# Patient Record
Sex: Male | Born: 1970 | Race: White | Hispanic: No | Marital: Single | Smoking: Former smoker
Health system: Southern US, Community
[De-identification: ages and names within clinical notes are randomized; demographics above are authoritative.]

## PROBLEM LIST (undated history)

## (undated) DIAGNOSIS — F819 Developmental disorder of scholastic skills, unspecified: Secondary | ICD-10-CM

## (undated) DIAGNOSIS — F29 Unspecified psychosis not due to a substance or known physiological condition: Secondary | ICD-10-CM

## (undated) DIAGNOSIS — T7840XA Allergy, unspecified, initial encounter: Secondary | ICD-10-CM

## (undated) DIAGNOSIS — J45909 Unspecified asthma, uncomplicated: Secondary | ICD-10-CM

## (undated) DIAGNOSIS — F319 Bipolar disorder, unspecified: Secondary | ICD-10-CM

## (undated) HISTORY — PX: HERNIA REPAIR: SHX51

## (undated) HISTORY — DX: Unspecified psychosis not due to a substance or known physiological condition: F29

## (undated) HISTORY — DX: Allergy, unspecified, initial encounter: T78.40XA

---

## 1998-12-21 ENCOUNTER — Inpatient Hospital Stay (HOSPITAL_COMMUNITY): Admission: AD | Admit: 1998-12-21 | Discharge: 1998-12-24 | Payer: Self-pay | Admitting: *Deleted

## 1999-05-10 ENCOUNTER — Encounter: Admission: RE | Admit: 1999-05-10 | Discharge: 1999-05-10 | Payer: Self-pay | Admitting: Family Medicine

## 1999-06-09 ENCOUNTER — Emergency Department (HOSPITAL_COMMUNITY): Admission: EM | Admit: 1999-06-09 | Discharge: 1999-06-09 | Payer: Self-pay | Admitting: Emergency Medicine

## 1999-07-08 ENCOUNTER — Emergency Department (HOSPITAL_COMMUNITY): Admission: EM | Admit: 1999-07-08 | Discharge: 1999-07-08 | Payer: Self-pay | Admitting: Emergency Medicine

## 1999-07-19 ENCOUNTER — Encounter: Admission: RE | Admit: 1999-07-19 | Discharge: 1999-07-19 | Payer: Self-pay | Admitting: Family Medicine

## 1999-08-03 ENCOUNTER — Emergency Department (HOSPITAL_COMMUNITY): Admission: EM | Admit: 1999-08-03 | Discharge: 1999-08-03 | Payer: Self-pay | Admitting: Emergency Medicine

## 1999-08-04 ENCOUNTER — Emergency Department (HOSPITAL_COMMUNITY): Admission: EM | Admit: 1999-08-04 | Discharge: 1999-08-04 | Payer: Self-pay | Admitting: Emergency Medicine

## 1999-09-18 ENCOUNTER — Emergency Department (HOSPITAL_COMMUNITY): Admission: EM | Admit: 1999-09-18 | Discharge: 1999-09-18 | Payer: Self-pay | Admitting: Emergency Medicine

## 2000-01-24 ENCOUNTER — Encounter: Admission: RE | Admit: 2000-01-24 | Discharge: 2000-01-24 | Payer: Self-pay | Admitting: Family Medicine

## 2000-02-02 ENCOUNTER — Emergency Department (HOSPITAL_COMMUNITY): Admission: EM | Admit: 2000-02-02 | Discharge: 2000-02-02 | Payer: Self-pay | Admitting: Emergency Medicine

## 2000-02-13 ENCOUNTER — Emergency Department (HOSPITAL_COMMUNITY): Admission: EM | Admit: 2000-02-13 | Discharge: 2000-02-13 | Payer: Self-pay | Admitting: Emergency Medicine

## 2000-03-20 ENCOUNTER — Emergency Department (HOSPITAL_COMMUNITY): Admission: EM | Admit: 2000-03-20 | Discharge: 2000-03-21 | Payer: Self-pay | Admitting: Emergency Medicine

## 2000-04-10 ENCOUNTER — Inpatient Hospital Stay (HOSPITAL_COMMUNITY): Admission: EM | Admit: 2000-04-10 | Discharge: 2000-04-17 | Payer: Self-pay | Admitting: Psychiatry

## 2000-07-05 ENCOUNTER — Encounter: Admission: RE | Admit: 2000-07-05 | Discharge: 2000-07-05 | Payer: Self-pay | Admitting: Family Medicine

## 2000-09-29 ENCOUNTER — Emergency Department (HOSPITAL_COMMUNITY): Admission: EM | Admit: 2000-09-29 | Discharge: 2000-09-29 | Payer: Self-pay | Admitting: *Deleted

## 2000-10-02 ENCOUNTER — Encounter: Admission: RE | Admit: 2000-10-02 | Discharge: 2000-10-02 | Payer: Self-pay | Admitting: Family Medicine

## 2000-11-18 ENCOUNTER — Emergency Department (HOSPITAL_COMMUNITY): Admission: EM | Admit: 2000-11-18 | Discharge: 2000-11-18 | Payer: Self-pay | Admitting: Emergency Medicine

## 2001-01-14 ENCOUNTER — Inpatient Hospital Stay (HOSPITAL_COMMUNITY): Admission: EM | Admit: 2001-01-14 | Discharge: 2001-01-16 | Payer: Self-pay | Admitting: *Deleted

## 2001-02-07 ENCOUNTER — Encounter: Admission: RE | Admit: 2001-02-07 | Discharge: 2001-02-07 | Payer: Self-pay | Admitting: Family Medicine

## 2001-02-27 ENCOUNTER — Encounter: Admission: RE | Admit: 2001-02-27 | Discharge: 2001-02-27 | Payer: Self-pay | Admitting: Family Medicine

## 2001-03-06 ENCOUNTER — Inpatient Hospital Stay (HOSPITAL_COMMUNITY): Admission: EM | Admit: 2001-03-06 | Discharge: 2001-03-13 | Payer: Self-pay | Admitting: *Deleted

## 2001-04-23 ENCOUNTER — Encounter: Admission: RE | Admit: 2001-04-23 | Discharge: 2001-04-23 | Payer: Self-pay | Admitting: Sports Medicine

## 2001-08-22 ENCOUNTER — Emergency Department (HOSPITAL_COMMUNITY): Admission: EM | Admit: 2001-08-22 | Discharge: 2001-08-23 | Payer: Self-pay | Admitting: *Deleted

## 2001-09-05 ENCOUNTER — Encounter: Admission: RE | Admit: 2001-09-05 | Discharge: 2001-09-05 | Payer: Self-pay | Admitting: Family Medicine

## 2002-03-05 ENCOUNTER — Emergency Department (HOSPITAL_COMMUNITY): Admission: EM | Admit: 2002-03-05 | Discharge: 2002-03-05 | Payer: Self-pay | Admitting: Emergency Medicine

## 2002-04-04 ENCOUNTER — Encounter: Admission: RE | Admit: 2002-04-04 | Discharge: 2002-04-04 | Payer: Self-pay | Admitting: Family Medicine

## 2002-09-23 ENCOUNTER — Encounter: Admission: RE | Admit: 2002-09-23 | Discharge: 2002-09-23 | Payer: Self-pay | Admitting: Family Medicine

## 2002-09-24 ENCOUNTER — Encounter: Admission: RE | Admit: 2002-09-24 | Discharge: 2002-09-24 | Payer: Self-pay | Admitting: Family Medicine

## 2003-02-18 ENCOUNTER — Encounter: Admission: RE | Admit: 2003-02-18 | Discharge: 2003-02-18 | Payer: Self-pay | Admitting: Family Medicine

## 2003-08-23 ENCOUNTER — Emergency Department (HOSPITAL_COMMUNITY): Admission: EM | Admit: 2003-08-23 | Discharge: 2003-08-23 | Payer: Self-pay | Admitting: Emergency Medicine

## 2003-10-01 ENCOUNTER — Encounter: Admission: RE | Admit: 2003-10-01 | Discharge: 2003-10-01 | Payer: Self-pay | Admitting: Family Medicine

## 2003-12-29 ENCOUNTER — Emergency Department (HOSPITAL_COMMUNITY): Admission: EM | Admit: 2003-12-29 | Discharge: 2003-12-30 | Payer: Self-pay | Admitting: Emergency Medicine

## 2004-01-17 ENCOUNTER — Emergency Department (HOSPITAL_COMMUNITY): Admission: EM | Admit: 2004-01-17 | Discharge: 2004-01-18 | Payer: Self-pay | Admitting: Emergency Medicine

## 2004-04-12 ENCOUNTER — Ambulatory Visit: Payer: Self-pay | Admitting: Family Medicine

## 2004-11-10 ENCOUNTER — Ambulatory Visit: Payer: Self-pay | Admitting: Family Medicine

## 2004-12-29 ENCOUNTER — Ambulatory Visit: Payer: Self-pay | Admitting: Family Medicine

## 2005-12-07 ENCOUNTER — Ambulatory Visit: Payer: Self-pay | Admitting: Family Medicine

## 2006-07-04 ENCOUNTER — Inpatient Hospital Stay (HOSPITAL_COMMUNITY): Admission: EM | Admit: 2006-07-04 | Discharge: 2006-07-09 | Payer: Self-pay | Admitting: *Deleted

## 2006-07-04 ENCOUNTER — Ambulatory Visit: Payer: Self-pay | Admitting: *Deleted

## 2006-09-06 ENCOUNTER — Ambulatory Visit: Payer: Self-pay | Admitting: Family Medicine

## 2006-09-06 LAB — CONVERTED CEMR LAB
ALT: 20 units/L (ref 0–53)
AST: 18 units/L (ref 0–37)
Albumin: 4.4 g/dL (ref 3.5–5.2)
Alkaline Phosphatase: 66 units/L (ref 39–117)
BUN: 11 mg/dL (ref 6–23)
Calcium: 10 mg/dL (ref 8.4–10.5)
Chloride: 108 meq/L (ref 96–112)
HCT: 37.3 %
Hemoglobin: 12.6 g/dL
MCV: 96.6 fL
Platelets: 189 10*3/uL
Potassium: 4.5 meq/L (ref 3.5–5.3)
RBC: 3.86 M/uL
Retic Count, Absolute: 70.6 (ref 19.0–186.0)
Retic Ct Pct: 1.8 % (ref 0.4–3.1)
Sodium: 140 meq/L (ref 135–145)
Total Protein: 7.1 g/dL (ref 6.0–8.3)
WBC: 5.3 10*3/uL

## 2006-10-03 ENCOUNTER — Telehealth: Payer: Self-pay | Admitting: *Deleted

## 2007-08-01 ENCOUNTER — Ambulatory Visit: Payer: Self-pay | Admitting: Family Medicine

## 2007-08-01 LAB — CONVERTED CEMR LAB
ALT: 11 units/L (ref 0–53)
Alkaline Phosphatase: 125 units/L — ABNORMAL HIGH (ref 39–117)
CO2: 20 meq/L (ref 19–32)
Cholesterol: 172 mg/dL (ref 0–200)
Creatinine, Ser: 0.72 mg/dL (ref 0.40–1.50)
Glucose, Bld: 93 mg/dL (ref 70–99)
HCT: 42 % (ref 39.0–52.0)
LDL Cholesterol: 98 mg/dL (ref 0–99)
MCHC: 33.1 g/dL (ref 30.0–36.0)
MCV: 89 fL (ref 78.0–100.0)
Platelets: 179 10*3/uL (ref 150–400)
Sodium: 141 meq/L (ref 135–145)
Total Bilirubin: 0.5 mg/dL (ref 0.3–1.2)
Total CHOL/HDL Ratio: 4.2
Triglycerides: 164 mg/dL — ABNORMAL HIGH (ref <150)
VLDL: 33 mg/dL (ref 0–40)
WBC: 4.5 10*3/uL (ref 4.0–10.5)

## 2007-08-02 ENCOUNTER — Encounter: Payer: Self-pay | Admitting: Family Medicine

## 2007-09-19 ENCOUNTER — Ambulatory Visit: Payer: Self-pay | Admitting: Family Medicine

## 2007-11-13 ENCOUNTER — Emergency Department (HOSPITAL_COMMUNITY): Admission: EM | Admit: 2007-11-13 | Discharge: 2007-11-13 | Payer: Self-pay | Admitting: Emergency Medicine

## 2007-11-22 ENCOUNTER — Encounter: Payer: Self-pay | Admitting: Family Medicine

## 2007-12-19 ENCOUNTER — Telehealth: Payer: Self-pay | Admitting: Family Medicine

## 2007-12-24 ENCOUNTER — Encounter: Payer: Self-pay | Admitting: Family Medicine

## 2008-09-10 ENCOUNTER — Ambulatory Visit: Payer: Self-pay | Admitting: Family Medicine

## 2008-10-15 ENCOUNTER — Ambulatory Visit: Payer: Self-pay | Admitting: Family Medicine

## 2008-10-15 LAB — CONVERTED CEMR LAB
Alkaline Phosphatase: 69 units/L (ref 39–117)
BUN: 9 mg/dL (ref 6–23)
Creatinine, Ser: 0.98 mg/dL (ref 0.40–1.50)
Glucose, Bld: 87 mg/dL (ref 70–99)
Hemoglobin: 13.7 g/dL (ref 13.0–17.0)
MCHC: 34.5 g/dL (ref 30.0–36.0)
MCV: 91.3 fL (ref 78.0–100.0)
RBC: 4.35 M/uL (ref 4.22–5.81)
RDW: 12.8 % (ref 11.5–15.5)
Total Bilirubin: 0.4 mg/dL (ref 0.3–1.2)

## 2008-10-29 ENCOUNTER — Ambulatory Visit: Payer: Self-pay | Admitting: Family Medicine

## 2009-09-23 ENCOUNTER — Ambulatory Visit: Payer: Self-pay | Admitting: Family Medicine

## 2009-09-23 LAB — CONVERTED CEMR LAB
Albumin: 4.8 g/dL (ref 3.5–5.2)
Alkaline Phosphatase: 70 units/L (ref 39–117)
CO2: 25 meq/L (ref 19–32)
Calcium: 10 mg/dL (ref 8.4–10.5)
Chloride: 105 meq/L (ref 96–112)
Glucose, Bld: 88 mg/dL (ref 70–99)
Lithium Lvl: 1.01 meq/L (ref 0.80–1.40)
Potassium: 4.7 meq/L (ref 3.5–5.3)
Sodium: 138 meq/L (ref 135–145)
TSH: 4.214 microintl units/mL (ref 0.350–4.500)
Total Protein: 7 g/dL (ref 6.0–8.3)

## 2009-10-19 ENCOUNTER — Ambulatory Visit: Payer: Self-pay | Admitting: Family Medicine

## 2009-10-19 DIAGNOSIS — M79609 Pain in unspecified limb: Secondary | ICD-10-CM

## 2010-06-28 NOTE — Assessment & Plan Note (Signed)
Summary: CPE   Vital Signs:  Patient profile:   40 year old male Height:      66.5 inches Weight:      159 pounds BMI:     25.37 BSA:     1.83 Temp:     98.9 degrees F Pulse rate:   60 / minute BP sitting:   103 / 66  Vitals Entered By: Jone Baseman CMA (September 23, 2009 11:51 AM) CC: form Is Patient Diabetic? No Pain Assessment Patient in pain? no        CC:  form.  History of Present Illness: Feels well  Weight - has stopped eating pasta and pizza and red meat.  Mainly eats veggies fruit and chicken. No fever or gastrointestinal bleeding.  Only occaisional mild abdominal pain  Hand stiffness - occaisional feelings of tightness in hands.  No soft tissue swelling or redness.  Goes away with use  ROS - as above PMH - Medications reviewed and updated in medication list.  Smoking Status noted in VS form    Habits & Providers  Alcohol-Tobacco-Diet     Tobacco Status: quit > 6 months  Current Medications (verified): 1)  Clonidine Hcl 0.1 Mg Tabs (Clonidine Hcl) .Marland Kitchen.. 1 Tablet in Am 2 At Pm Rx By Dr Jacqulyn Bath 2)  Risperdal 4 Mg Tabs (Risperidone) .... Take 1 Tablet Every Night Rx By Dr Jacqulyn Bath 3)  Lithium Carbonate 450 Mg  Tbcr (Lithium Carbonate) .... 1.5 By Mouth Two Times A Day By Dr Jacqulyn Bath 4)  Omeprazole 20 Mg Cpdr (Omeprazole) .... As Directed 5)  Ambien 10 Mg Tabs (Zolpidem Tartrate) .Marland Kitchen.. 1 At Bedtime Per Dr Jacqulyn Bath 6)  Zyprexa 10 Mg Tabs (Olanzapine) .Marland Kitchen.. 1 At Bedtime 7)  Multivitamins  Tabs (Multiple Vitamin) .Marland Kitchen.. 1 Daily  Allergies: 1)  Penicillin G Potassium (Penicillin G Potassium)  Social History: Smoking Status:  quit > 6 months  Review of Systems  The patient denies fever, decreased hearing, chest pain, syncope, peripheral edema, headaches, hemoptysis, hematochezia, muscle weakness, suspicious skin lesions, difficulty walking, depression, enlarged lymph nodes, and testicular masses.    Physical Exam  General:  Well-developed,well-nourished,in no acute  distress; alert,appropriate and cooperative throughout examination Head:  Normocephalic and atraumatic without obvious abnormalities. No apparent alopecia or balding. Ears:  External ear exam shows no significant lesions or deformities.  Otoscopic examination reveals clear canals, tympanic membranes are intact bilaterally without bulging, retraction, inflammation or discharge. Hearing is grossly normal bilaterally. Mouth:  Oral mucosa and oropharynx without lesions or exudates.  Teeth in good repair. Neck:  No deformities, masses, or tenderness noted. Lungs:  Normal respiratory effort, chest expands symmetrically. Lungs are clear to auscultation, no crackles or wheezes. Heart:  Normal rate and regular rhythm. S1 and S2 normal without gallop, murmur, click, rub or other extra sounds. Abdomen:  Bowel sounds positive,abdomen soft and non-tender without masses, organomegaly or hernias noted. Genitalia:  Testes bilaterally descended without nodularity, tenderness or masses. No scrotal masses or lesions. No penis lesions or urethral discharge. Msk:  No deformity or scoliosis noted of thoracic or lumbar spine.   Extremities:  No clubbing, cyanosis, edema, or deformity noted with normal full range of motion of all joints.   Skin:  Intact without suspicious lesions or rashes Cervical Nodes:  No lymphadenopathy noted Inguinal Nodes:  No significant adenopathy   Impression & Recommendations:  Problem # 1:  Preventive Health Care (ICD-V70.0) Normal exam.   Will monitor his weight but is likely due to improved  diet. Will add multivitamin. His hand complaints seem most consistent with mild arthritis.  No findings on exam   Complete Medication List: 1)  Clonidine Hcl 0.1 Mg Tabs (Clonidine hcl) .Marland Kitchen.. 1 tablet in am 2 at pm rx by dr long 2)  Risperdal 4 Mg Tabs (Risperidone) .... Take 1 tablet every night rx by dr long 3)  Lithium Carbonate 450 Mg Tbcr (Lithium carbonate) .... 1.5 by mouth two times a day by  dr long 4)  Omeprazole 20 Mg Cpdr (Omeprazole) .... As directed 5)  Ambien 10 Mg Tabs (Zolpidem tartrate) .Marland Kitchen.. 1 at bedtime per dr long 6)  Zyprexa 10 Mg Tabs (Olanzapine) .Marland Kitchen.. 1 at bedtime 7)  Multivitamins Tabs (Multiple vitamin) .Marland Kitchen.. 1 daily  Other Orders: Comp Met-[facility] [id number]) TSH-[facility] [id number]) Miscellaneous Lab Charge-[facility] [id number]) [facility] - Est  40-64 yrs (32440) Prescriptions: MULTIVITAMINS  TABS (MULTIPLE VITAMIN) 1 daily  #100 x 6   Entered and Authorized by:   Pearlean Brownie MD   Signed by:   Pearlean Brownie MD on 09/23/2009   Method used:   Print then Give to Patient   RxID:   [id number]

## 2010-06-28 NOTE — Assessment & Plan Note (Signed)
Summary: swollen arm,tcb   Vital Signs:  Patient profile:   40 year old male Height:      66.5 inches Weight:      159.1 pounds BMI:     25.39 Temp:     98.3 degrees F oral Pulse rate:   76 / minute BP sitting:   104 / 67  (left arm) Cuff size:   regular  Vitals Entered By: Garen Grams LPN (Oct 19, 2009 9:53 AM) CC: Right upper arm swollen and bruised Is Patient Diabetic? No Pain Assessment Patient in pain? yes     Location: right arm   CC:  Right upper arm swollen and bruised.  History of Present Illness: 1. swollen R upper arm Reports swollen R upper arm since yesterday. Initially noticed a bump on Friday in the antecubital fossa of R arm. Pt began rubbing the area and it didin't seem to improve. Applied A&D ointment for a few days without improvement. Yesterday pt reported that his arm had swollen and was bruising. No warmth or redness. Denies any trauma or particularly strenous activity that could have caused the pain..  ROS: no fever, chills, no other swelling. no forearm swelling or arm weakness.   PMHx: reports that he broke his righ upper arm remotely.  Current Medications (verified): 1)  Clonidine Hcl 0.1 Mg Tabs (Clonidine Hcl) .Marland Kitchen.. 1 Tablet in Am 2 At Pm Rx By Dr Jacqulyn Bath 2)  Risperdal 4 Mg Tabs (Risperidone) .... Take 1 Tablet Every Night Rx By Dr Jacqulyn Bath 3)  Lithium Carbonate 450 Mg  Tbcr (Lithium Carbonate) .... 1.5 By Mouth Two Times A Day By Dr Jacqulyn Bath 4)  Omeprazole 20 Mg Cpdr (Omeprazole) .... As Directed 5)  Ambien 10 Mg Tabs (Zolpidem Tartrate) .Marland Kitchen.. 1 At Bedtime Per Dr Jacqulyn Bath 6)  Zyprexa 10 Mg Tabs (Olanzapine) .Marland Kitchen.. 1 At Bedtime 7)  Multivitamins  Tabs (Multiple Vitamin) .Marland Kitchen.. 1 Daily 8)  Ibuprofen 600 Mg Tabs (Ibuprofen) .... Take One Tablet Every 6-8 Hours With Food As Needed For Pain and Swelling  Allergies (verified): 1)  Penicillin G Potassium (Penicillin G Potassium)  Review of Systems       review of systems as noted in HPI section   Physical  Exam  General:  vital signs reviewed and normal Alert, appropriate; well-dressed and well-nourished  Msk:  R upper arm with moderate swelling around the lateral head of the triceps. Has pain with resisted extension at the elbow. Mildly tender to palpation over the  area of swelling. No warmth or erythema. Mild resolving bruise at the superior aspect of R antecubital fossa but no palpable lump or mass. Has full axillary, radial and ulnar pulses. Normal grip strength. No forearm swelling. Hand and forearm are warm and well-perfused.   L arm normal to inspection/palpation   Impression & Recommendations:  Problem # 1:  ARM PAIN, RIGHT (ICD-729.5) Assessment New  exam appears consistent with muscular injury. Doubt DVT or other vascular pathology. Consider ultrasound but would not change management at this point. Ice and NSAIDs for now with strict return parameters. Discussed return parameters with pt and caregiver who express agreement and understanding. To follow-up for worsening at any time or if not totally resolved in one week.   precepted with Dr. Deirdre Priest (pt's PCP).  Orders: [facility]- Est  Level 4 ([id number])  Complete Medication List: 1)  Clonidine Hcl 0.1 Mg Tabs (Clonidine hcl) .Marland Kitchen.. 1 tablet in am 2 at pm rx by dr long 2)  Risperdal 4 Mg  Tabs (Risperidone) .... Take 1 tablet every night rx by dr long 3)  Lithium Carbonate 450 Mg Tbcr (Lithium carbonate) .... 1.5 by mouth two times a day by dr long 4)  Omeprazole 20 Mg Cpdr (Omeprazole) .... As directed 5)  Ambien 10 Mg Tabs (Zolpidem tartrate) .Marland Kitchen.. 1 at bedtime per dr long 6)  Zyprexa 10 Mg Tabs (Olanzapine) .Marland Kitchen.. 1 at bedtime 7)  Multivitamins Tabs (Multiple vitamin) .Marland Kitchen.. 1 daily 8)  Ibuprofen 600 Mg Tabs (Ibuprofen) .... Take one tablet every 6-8 hours with food as needed for pain and swelling  Patient Instructions: 1)  ice the arm at least three times a day for 20 minutes 2)  take ibuprofen 600 every 6-8 hours for pain and  swelling 3)  if this gets worse suddenly, you develop fever or other concerning symptoms you need to be seen immediately. 4)  if this is not gone in one week, please call to be seen by Dr. Deirdre Priest. Prescriptions: IBUPROFEN 600 MG TABS (IBUPROFEN) take one tablet every 6-8 hours with food as needed for pain and swelling  #30 x 0   Entered and Authorized by:   Myrtie Soman  MD   Signed by:   Myrtie Soman  MD on 10/19/2009   Method used:   Electronically to        [facility]* (retail)       [address]       [place], [place]  [address]       Ph: [phone]       Fax: [phone]   RxID:   [id number]

## 2010-10-14 NOTE — H&P (Signed)
[facility]  Patient:    Daniel Holmes, Daniel Holmes Visit Number: [id number] MRN: [id number]          Service Type: Attending:  Jeanice Lim, M.D. Dictated by:   Candi Leash. Orsini, N.P. Adm. Date:  03/06/01                     Psychiatric Admission Assessment  DATE OF ADMISSION:  March 06, 2001  PATIENT IDENTIFICATION:  This is a 40 year old single white male who was voluntarily admitted to [facility] on March 06, 2001, for command auditory hallucinations.  HISTORY OF PRESENT ILLNESS:  The patient presents with a history of self-inflicted injuries.  He had been cutting his left wrist with a metal tape measure on the day of admission.  He was experiencing auditory hallucinations that are telling him to kill himself.  He feels he needs to be "one-to-one" while at [employer].  The patient denies any depression or anxiety or suicidal ideation.  He has been sleeping well.  His appetite has decreased. He has lost about 10 pounds, some of that was intentional.  He reports he always hears voices but they had increased to the point where he wanted to hurt himself.  He has also been biting his left arm and, again, stating that he wanted to be on one-to-one.  The patient reports he has been compliant with his medications.  He denies any specific stressors.  PAST PSYCHIATRIC HISTORY:  Second hospitalization to [facility]. He was here in September 2002.  SUBSTANCE ABUSE HISTORY:  The patient smokes.  He denied any alcohol or drug use.  He has been sober for two years.  PAST MEDICAL HISTORY:  Primary care [facility]: Unsure.  Medical problems: Hypertension and asthma.  MEDICATIONS: 1. Catapres one patch every week placed on every Monday. 2. Multivitamin q.d. 3. Neurontin 600 mg t.i.d. 4. Lithobid 300 mg two at h.s. 5. Zyprexa 2 mg p.o. two at h.s. 6. Celexa 10 mg q.d. 7. Albuterol 90 mcg two puffs q.4h.  DRUG ALLERGIES:   PENICILLIN and BEE STINGS.  PHYSICAL EXAMINATION:  GENERAL:  Performed at [facility] Emergency Department.  Will obtain those records and place on chart.  LABORATORY DATA:  Hematocrit was mildly decreased at 38.6.  Acetaminophen level was 2.6.  Urine drug screen was negative.  Alcohol level was less than 5.  Urinalysis was within normal limits.  SOCIAL HISTORY:  He is a 40 year old single white male with no children.  He lives at [facility], which is a group home.  FAMILY HISTORY:  None.  MENTAL STATUS EXAMINATION:  He is an alert, young middle-aged Caucasian male. He is cooperative, wanting to talk.  Speech is loud but relevant.  He is somewhat difficult to understand.  Mood is pleasant.  Affect is pleasant. Thought processes: No deficits noted in thought content.  The patient is very intent, though, on getting one-to-one observation.  Cognitive: Intact.  He is alert to person and place but not date.  Judgment is poor.  Insight is poor.  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 Axis III:  None. Axis IV:   Deferred. Axis V:    Current is 35, past year is 60.  INITIAL PLAN OF CARE:  Plan is a voluntary admission to [facility] for psychosis.  Contract for safety.  Check every 15 minutes.  The patient does not want  to contract right now and is not satisfied that he is not on one-to-one.  Will resume his routine medications.  Will obtain labs and lithium level.  Will have Haldol and Ativan available for agitation. Our goal is to decrease his psychotic symptoms so the patient can be safe and functional, to return to group home if they will accept, to be medication compliant.  ESTIMATED LENGTH OF STAY:  Four to five days. Dictated by:   Candi Leash. Orsini, N.P. Attending:  Jeanice Lim, M.D. DD:  03/07/01 TD:  03/07/01 Job: [id number]
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
## 2010-10-14 NOTE — Discharge Summary (Signed)
[facility]  Patient:    Daniel Holmes, Daniel Holmes Visit Number: [id number] MRN: [id number]          Service Type: PSY Location: 40 0405 02 Attending Physician:  Rachael Fee Dictated by:   Reymundo Poll Dub Mikes, M.D. Admit Date:  03/06/2001 Discharge Date: 03/13/2001                             Discharge Summary  CHIEF COMPLAINT AND HISTORY OF PRESENT ILLNESS:  This was one of multiple admissions to [facility] for this 40 year old male with history of mood disorder as well as mild mental retardation.  He was admitted as he had started some cutting of himself.  He cut his left wrist with a metal tape on the day of admission.  He was experiencing auditory hallucinations telling him to kill himself.  Upon admission, he requested the need to be one-to-one because he could not trust himself.  He denied any depression, denied any anxiety or any suicidal ideas.  He just felt that the voices told him to hurt himself and he could not help it.  Appetite was preserved.  He sleeping had been preserved.  Appetite was decreased; he lost 10 pounds.  He always, as he claimed, heard voices but it was worse this time around.  He has been also biting his arm over and over again.  Claimed to be compliant with medications.  PAST PSYCHIATRIC HISTORY:  Has been hospitalized previously; last time September 2002.  SUBSTANCE ABUSE HISTORY:  No history of any alcohol or drug abuse.  PAST MEDICAL HISTORY:  Possibly hypertension and bronchial asthma.  MEDICATIONS ON ADMISSION: 1. Catapres one patch every week. 2. Multivitamin. 3. Neurontin 600 mg three times a day. 4. Lithobid 300 mg two at bedtime. 5. Zyprexa 2 mg at bedtime. 6. Celexa 10 mg at bedtime. 7. Albuterol 90 mcg two puffs every four hours.  PHYSICAL EXAMINATION:  GENERAL:  Performed; did show the self-inflicted laceration.  LABORATORY DATA UPON ADMISSION:  CBC was within normal limits.   Blood chemistry: Within normal limits.  Thyroid profile was within normal limits. Lithium upon admission was 0.31.  MENTAL STATUS EXAMINATION ON ADMISSION:  Alert male, cooperative, wanting to talk upon admission.  Speech was loud but relevant, somewhat difficult to understand.  Mood was pleasant.  Affect was pleasant.  Thought processes: No evidence of active psychosis.  He did admit voices that told him to hurt himself.  Cognitive: Well preserved.  ADMITTING DIAGNOSES: Axis I:    1. Psychotic disorder, not otherwise specified.            2. Rule out bipolar disorder, not otherwise specified. Axis II:   Mild mental retardation. Axis III:  1. Arterial hypertension.            2. Bronchial asthma. Axis IV:   Moderate. Axis V:    Global assessment of functioning upon admission 30-35, highest            global assessment of functioning in the last year 60.  HOSPITAL COURSE:  He was admitted and started in intensive individual and group psychotherapy.  He was initially placed on one-to-one.  Later, he was able to be monitored at a close distance, not requiring the one-on-one.  There was a lot of attention seeking behavior and behavior modification plan had to be implemented.  Medications were changed as follows: He was given Haldol  and Ativan on a p.r.n. basis.  He was placed on Haldol 5 mg twice a day and Zyprexa was increased to 20 mg per day.  Then Haldol was increased to 5 mg three times a day and Zyprexa was increased to 5 mg twice a day and 20 mg at bedtime.  Neurontin was increased to 800 mg three times a day and he was placed on Depakote 250 mg three times a day.  He seemed to start responding to these medications but it was felt that he was going to require a longer term, more specialized treatment facility for which he was transferred to [facility] for further treatment.  DISCHARGE DIAGNOSES: Axis I:    Bipolar disorder with psychotic features. Axis II:   Mild mental  retardation. Axis III:  1. Arterial hypertension.            2. Bronchial asthma. Axis IV:   Moderate. Axis V:    Global assessment of functioning upon discharge 45-50.  DISCHARGE MEDICATIONS: 1. Catapres patch every seven days. 2. Lithium carbonate 300 mg two at bedtime. 3. Zyprexa 5 mg twice a day and 20 mg at bedtime. 4. Neurontin 800 mg three times a day. 5. Depakote 250 mg three times a day. 6. Celexa 20 mg one half every day. 7. Haldol 5 mg three times a day. 8. Albuterol inhaler.  DISPOSITION:  To be transferred to [facility] for further longer term treatment. Dictated by:   Reymundo Poll Dub Mikes, M.D. Attending Physician:  Rachael Fee DD:  04/17/01 TD:  04/19/01 Job: [id number]

## 2010-10-14 NOTE — Discharge Summary (Signed)
[facility]  Patient:    Daniel Holmes, Daniel Holmes                        MRN: [id number] Adm. Date:  [id number] Disc. Date: [id number] Attending:  Marlyn Corporal Fabmy Dictator:   Johnella Moloney, NP                           Discharge Summary  HISTORY OF PRESENT ILLNESS:  Mr. Kleinpeter is a 40 year old single white male voluntary admitted to [facility] on April 10, 2000 for suicidal gesture along with psychotic behavior.   The patient reports he has been hearing voices, command hallucinations to harm himself.  He broke a window at the group home and cut his arm, sustaining laceration that required suturing.  Patient has a history of auditory hallucinations.  He denies any visual hallucinations.  He does report that he has been sleeping well.  His appetite has been good.  He denies any unusual stressors that prompted this behavior.  The patient has had several admission to [facility], as well as [facility] psychiatric unit, and apparently sees an M.D. at [facility].  PAST MEDICAL HISTORY:  Patient has a past medical history of asthma. Admission medications:  Depakote 250 b.i.d., Neurontin 600 mg t.i.d., Zyprexa 15 mg q.d. and Celexa 40 mg q.d.  DRUG ALLERGIES:  Patient reports being allergic to PENICILLIN, as well as to BEE STINGS.  PHYSICAL EXAMINATION:  Please see physical examination done at [facility] on April 09, 2000.  There were no positive findings. LABORATORY DATA:  Urine drug screen was negative.  Alcohol level was less than 10.  Patient had a dressing to his right arm, dry and intact, from the laceration that required suturing.  MENTAL STATUS EXAMINATION:  On admission, a young adult male resting in the quiet room in bed, cooperative, good eye contact.  Speech normal with a speech impediment.  At times speech is very difficult to understand.  His mood is neutral, affect  appropriate to mood. Thought processes:  Having auditory hallucinations, no visual hallucinations.  No suicidal or homicidal ideations. No delusions.  He is disoriented to time and place, but oriented to his name and situation.  Memory fair, judgment fair, insight poor.  ADMITTING DIAGNOSES: Axis I:     Bipolar disorder with psychotic features. Axis II:    Mild mental retardation. Axis III:   Asthma, seizure disorder. Axis IV:    Mild. Axis V:     Current global assessment of function 30, highest in past             year is 60.  HOSPITAL COURSE:  The patient was admitted to the [facility] unit for his psychotic behavior and he agreed to contract for safety and we did begin treatment.  We initially started him out on Depakote 250 b.i.d. p.o., Neurontin 600 t.i.d. p.o., along with Celexa 40 mg q.d.  We also needed to put him on a one to one since he was unable to contract for safety on November 13.  We stopped his Seroquel and added Zyprexa 10 mg q.a.m.  On November 15, his behavior improved and he could contract for safety, so his one to one was stopped.  We added Zyprexa 5 mg q.6h. p.r.n. p.o. as needed, Robitussin 30 cc q.6h. p.r.n. for cough, and we also added  an albuterol inhaler 2 puffs q.4h. p.r.n. due to his asthma.  We did note that he needed ferrous sulfate 325 mg p.o. q.d.  He continued to do fairly well.  On November 14, he had to be placed back on a one to one due to safety reasons, and he also was hearing voices that were telling him to bang his head and bite himself.  There were bruises and superficial abrasions to his lower left arm. He was sleeping well, appetite was good.  He reports that he was hearing the voices all the time, but said denied that they were any worse, and they were not telling him to harm himself.  He felt safe with the one to one.  On November 15, patient was doing well.  He was calmer and his explosiveness had resolved.  He slept  through the night.  Hallucinations have resolved.  We stopped the one to one.  He was able to contract for safety and the nurse could decide to change it back to one to one if he became more agitated, and on November 15 later in the day he refused to contract for safety again and also stated he wanted to hurt himself.  He was given medication to calm him down.  On November 15, we continued to try to reach patients father and we left a message.  Nothing had been heard from the family at this time.  We did speak with the group home worker who confirmed that patient could return to the group home.  On November 16, the patient appeared somewhat oversedated. He could hardly keep his eyes open and the dose of the Zyprexa at bedtime was reduced.  He continued to have command hallucinations once and a while, and sometimes they were telling him to hurt himself.  He remained on a one to one on November 17.  The voices were less disturbing, but he had no suicidal or homicidal ideation, no more self injurious behavior was noted, although he remained a little bit sedated, and Ativan was decreased.  On November 18, he was again much better, after a somewhat difficult night.  He denied hearing voices.  We increased his Zyprexa.  On November 19 his hallucinations had resolved and he is active in the milieu. He was tolerating his medication well and with good benefit.  On November 20, he was optimally improved.  There were no psychotic symptoms, affect was full, behavior was appropriate within the milieu.  He was pleasant and interactive, and it was felt like it would be safe to discharge him to a structured environment.  So therfore it was felt like he had no longer any suicidal or homicidal thoughts.  He could be safe, and it was felt like he could be managed in a structured outpatient living situation.  CONDITION ON DISCHARGE:  Patient is discharged in improved condition, with improvement in mood,  sleep, appetite.  No suicidal or homicidal thoughts. No hallucinations and he was considered to be much improved and was no longer acting out.   DISPOSITION:  Patient discharged to his group home, to a Chemical engineer, where there is a structured environment.  FOLLOW UP:  The patient is to follow up at [facility] on November 27 at 1 p.m.  Again, he was going to a group home with a structured setting setting where he could be observed closely. DISCHARGE MEDICATIONS: 1. Zyprexa 5 mg 1 at noon. 2. Zyprexa 20 mg 1 at bedtime. 3.  Albuterol inhaler 2 puffs q.6h. p.r.n. as needed. 4. Depakote 250 twice a day. 5. Neurontin 600 mg one t.i.d. 6. Celexa 40 mg once daily.  No diet restrictions.  Patient was instructed not to drink alcohol.  FINAL DIAGNOSIS: Axis I:     Bipolar disorder with psychotic features. Axis II:    Mild mental retardation. Axis III:   Asthma, seizure disorder. Axis IV:    Mild. Axis V:     Current global assessment of function at discharge 50, highest             past year 60. DD:  05/15/00 TD:  05/15/00 Job: [id number]

## 2010-10-14 NOTE — H&P (Signed)
[facility]  Patient:    Daniel Holmes, Daniel Holmes                  MRN: [id number] Adm. Date:  [id number] Attending:  Marlyn Corporal Fabmy Dictator:   Landry Corporal, NP                   Psychiatric Admission Assessment  DATE OF ADMISSION:  April 10, 2000.  PATIENT IDENTIFICATION:  Patient is a 40 year old single white male voluntarily admitted to [facility] on April 10, 2000 for suicide gesture and psychotic behavior.  HISTORY OF PRESENT ILLNESS:  Patient reports has been hearing voices, command hallucinations to harm himself.  He broke a window at the group home and cut his arm sustaining a laceration that required suturing.  Patient has a history of auditory hallucination.  He denies any visual hallucinations.  He does report that he has been sleeping well.  His appetite has been good.  He denies any unusual stressors that prompted this behavior.  PAST PSYCHIATRIC HISTORY:  Patient has had several admissions to [facility] [place] and currently sees Dr. ______ at [facility].  SUBSTANCE ABUSE HISTORY:  He smokes one cigarette per hour.  He is a nondrinker.  Denies any recreational drug use.  PAST MEDICAL HISTORY:  History of asthma.  MEDICATIONS:  Patient is on Depakote 250 mg b.i.d., Neurontin 600 mg t.i.d., Zyprexa 15 mg q.d., and Celexa 40 mg q.d.  DRUG ALLERGIES:  Allergic to PENICILLIN.  " ______ " makes him sick.  BEE STINGS.  SOCIAL HISTORY:  He is a 40 year old single white male but lives in a group home for approximately one year.  He is disabled.  He has a history of mental retardation.  He has no children.  FAMILY HISTORY:  No psychiatric problems that he is aware of.  PRIMARY CARE Levante Simones:  Unknown.  PHYSICAL EXAMINATION:  Urine drug screen was negative.  Alcohol level was less than 10.  Patient has a dressing to his right arm, dry and intact, from a laceration that required  suturing.  MENTAL STATUS EXAMINATION:  Young adult male resting in the quiet room in bed. He is cooperative.  Good eye contact.  His speech is normal with a speech impediment.  At times, speech is very difficult to understand.  His mood is neutral.  His affect is appropriate to mood.  Thought process: Positive auditory hallucinations, negative visual hallucinations, negative suicidal or homicidal ideation, negative delusions.  Cognitive: He is disoriented to time and place but he is oriented to his name and situation, his memory is fair, judgment is fair, insight is poor.  ADMISSION DIAGNOSES: Axis I:    Bipolar disorder with psychotic features. Axis II:   Mild mental retardation. Axis III:  Asthma and seizure disorder. Axis IV:   Mild. Axis V:    Current is 30, past year 75.  INITIAL PLAN OF CARE:  Plan voluntary admission to [facility] for psychotic behavior, contract for safety, check every 15 minutes, resume his routine medications.  We will obtain CMET, CBC, and Depakote level. Patient may take p.r.n. medications per unit protocol.  ESTIMATED LENGTH OF STAY:  His tentative length of stay is four to five days. DD:  04/10/00 TD:  04/10/00 Job: [id number]

## 2011-01-18 ENCOUNTER — Ambulatory Visit (INDEPENDENT_AMBULATORY_CARE_PROVIDER_SITE_OTHER): Payer: Medicaid Other | Admitting: Family Medicine

## 2011-01-18 ENCOUNTER — Encounter: Payer: Self-pay | Admitting: Family Medicine

## 2011-01-18 VITALS — BP 114/69 | HR 60 | Temp 97.8°F | Wt 153.0 lb

## 2011-01-18 DIAGNOSIS — Z0000 Encounter for general adult medical examination without abnormal findings: Secondary | ICD-10-CM

## 2011-01-18 DIAGNOSIS — Z79899 Other long term (current) drug therapy: Secondary | ICD-10-CM

## 2011-01-18 LAB — COMPREHENSIVE METABOLIC PANEL
AST: 18 U/L (ref 0–37)
BUN: 13 mg/dL (ref 6–23)
CO2: 25 mEq/L (ref 19–32)
Calcium: 9.7 mg/dL (ref 8.4–10.5)
Chloride: 106 mEq/L (ref 96–112)
Creat: 0.98 mg/dL (ref 0.50–1.35)
Total Bilirubin: 0.5 mg/dL (ref 0.3–1.2)

## 2011-01-18 NOTE — Patient Instructions (Addendum)
Start to eat food with red in it.  It is ok to eat regular amounts of pizza and meat with ketchup or sauce  Come back in 6 months to check your weight or sooner if you are still losing weight  Take Tylenol 2 tablets twice a day as needed for knee pain

## 2011-01-18 NOTE — Progress Notes (Signed)
  Subjective:    Patient ID: Daniel Holmes, male    DOB: February 22, 1971, 40 y.o.   MRN: [id number]  HPI  For CPE.  Feels well except intermittent bilateral knee pain.  Not taking any medicine for it.  No swelling or redness or fever or giving way.  Better with rest  Patient reports no  vision/ hearing changes,anorexia, weight change, fever ,adenopathy, persistant / recurrent hoarseness, swallowing issues, chest pain, edema,persistant / recurrent cough, hemoptysis, dyspnea(rest, exertional, paroxysmal nocturnal), gastrointestinal  bleeding (melena, rectal bleeding), abdominal pain, excessive heart burn, GU symptoms(dysuria, hematuria, pyuria, voiding/incontinence  Issues) syncope, focal weakness, severe memory loss, concerning skin lesions, depression, anxiety, abnormal bruising/bleeding, major joint swelling.     Review of Systems     Objective:   Physical Exam  Eye - Pupils Equal Round Reactive to light, Extraocular movements intact, Fundi without hemorrhage or visible lesions, Conjunctiva without redness or discharge Neck:  No deformities, thyromegaly, masses, or tenderness noted.   Supple with full range of motion without pain. Ears:  External ear exam shows no significant lesions or deformities.  Otoscopic examination reveals clear canals, tympanic membranes are intact bilaterally without bulging, retraction, inflammation or discharge. Hearing is grossly normal bilaterall Heart - Regular rate and rhythm.  No murmurs, gallops or rubs.    Lungs:  Normal respiratory effort, chest expands symmetrically. Lungs are clear to auscultation, no crackles or wheezes. Abdomen: soft and non-tender without masses, organomegaly or hernias noted.  No guarding or rebound Extremities:  No cyanosis, edema, or deformity noted with good range of motion of all major joints.  Minor pain with diffuse palpation of knees.  No soft tissue swelling and localized tenderness or laxity Skin:  Intact without suspicious  lesions or rashes        Assessment & Plan:   Normal Exam  His parents and caretaker are apparently concerned about his weight.  He does not eat anything that is red convinced that it may cause vomiting consequently does not eat pizza or any sauces or meats.

## 2011-01-19 LAB — LITHIUM LEVEL: Lithium Lvl: 0.63 mEq/L — ABNORMAL LOW (ref 0.80–1.40)

## 2011-01-23 ENCOUNTER — Encounter: Payer: Self-pay | Admitting: Family Medicine

## 2011-06-21 ENCOUNTER — Encounter: Payer: Self-pay | Admitting: Family Medicine

## 2011-06-21 ENCOUNTER — Ambulatory Visit (INDEPENDENT_AMBULATORY_CARE_PROVIDER_SITE_OTHER): Payer: Medicaid Other | Admitting: Family Medicine

## 2011-06-21 ENCOUNTER — Other Ambulatory Visit: Payer: Self-pay | Admitting: Family Medicine

## 2011-06-21 VITALS — BP 119/75 | HR 97 | Temp 98.1°F | Ht 67.5 in | Wt 158.2 lb

## 2011-06-21 DIAGNOSIS — R634 Abnormal weight loss: Secondary | ICD-10-CM

## 2011-06-21 DIAGNOSIS — Z79899 Other long term (current) drug therapy: Secondary | ICD-10-CM

## 2011-06-21 DIAGNOSIS — M25519 Pain in unspecified shoulder: Secondary | ICD-10-CM

## 2011-06-21 MED ORDER — ACETAMINOPHEN 325 MG PO TABS: 650.0000 mg | ORAL_TABLET | Freq: Every evening | ORAL | Status: DC | PRN

## 2011-06-21 NOTE — Assessment & Plan Note (Signed)
Consistent with rotator cuff tendonitis.  Currently mild so treat with tylenol and no heavy lifting.  Discussed injection if worsens.

## 2011-06-21 NOTE — Patient Instructions (Signed)
You have tendonitis of your rotator cuff  Use tylenol as needed  Keep eating the way you are

## 2011-06-21 NOTE — Progress Notes (Signed)
  Subjective:    Patient ID: STANLY SI, male    DOB: 1971/04/28, 41 y.o.   MRN: [id number]  HPI  Shoulder Pain Left side for the last few weeks.  No injury or specific overuse.  Hurts when moves or lays on it at night.  No soft tissue swelling or redness or weakness.  Taking tylenol which helps.  Similar to pain in his knees  Weight loss Eating well with a variety of foods.  Weight has increased.  No abdomen pain or fever or chills or nausea or vomiting  Long term medications Taking zyprexa a lithium without problems  Review of Symptoms - see HPI  PMH - Smoking status noted.      Review of Systems     Objective:   Physical Exam no apparent distress Left shoulder - FROM mild tenderness to palpation posteriorly.  No focal pain.  Distal sensation and str is normal  Knees - FROM nontender no soft tissue swelling       Assessment & Plan:

## 2011-06-21 NOTE — Assessment & Plan Note (Signed)
Improving with regular diet.  Continue to monitor

## 2011-06-21 NOTE — Assessment & Plan Note (Signed)
Check levels and glucose

## 2011-06-22 ENCOUNTER — Encounter: Payer: Self-pay | Admitting: Family Medicine

## 2011-06-22 DIAGNOSIS — Z79899 Other long term (current) drug therapy: Secondary | ICD-10-CM

## 2011-06-22 LAB — LITHIUM LEVEL: Lithium Lvl: 0.5 meq/L — ABNORMAL LOW (ref 0.80–1.40)

## 2011-06-22 LAB — BASIC METABOLIC PANEL
CO2: 22 mEq/L (ref 19–32)
Chloride: 108 mEq/L (ref 96–112)
Potassium: 4.3 mEq/L (ref 3.5–5.3)

## 2012-03-11 ENCOUNTER — Ambulatory Visit: Payer: Medicaid Other | Admitting: Family Medicine

## 2012-03-18 ENCOUNTER — Ambulatory Visit: Payer: Medicaid Other | Admitting: Family Medicine

## 2012-03-27 ENCOUNTER — Ambulatory Visit (INDEPENDENT_AMBULATORY_CARE_PROVIDER_SITE_OTHER): Payer: Medicaid Other | Admitting: Family Medicine

## 2012-03-27 ENCOUNTER — Encounter: Payer: Self-pay | Admitting: Family Medicine

## 2012-03-27 ENCOUNTER — Other Ambulatory Visit: Payer: Self-pay | Admitting: Family Medicine

## 2012-03-27 VITALS — BP 110/69 | HR 70 | Temp 98.6°F | Ht 67.5 in | Wt 152.0 lb

## 2012-03-27 DIAGNOSIS — D485 Neoplasm of uncertain behavior of skin: Secondary | ICD-10-CM | POA: Insufficient documentation

## 2012-03-27 DIAGNOSIS — R634 Abnormal weight loss: Secondary | ICD-10-CM

## 2012-03-27 DIAGNOSIS — Z23 Encounter for immunization: Secondary | ICD-10-CM

## 2012-03-27 DIAGNOSIS — Z79899 Other long term (current) drug therapy: Secondary | ICD-10-CM

## 2012-03-27 DIAGNOSIS — R7989 Other specified abnormal findings of blood chemistry: Secondary | ICD-10-CM

## 2012-03-27 LAB — COMPREHENSIVE METABOLIC PANEL
Albumin: 4.4 g/dL (ref 3.5–5.2)
BUN: 12 mg/dL (ref 6–23)
CO2: 25 mEq/L (ref 19–32)
Calcium: 9.8 mg/dL (ref 8.4–10.5)
Chloride: 109 mEq/L (ref 96–112)
Creat: 0.92 mg/dL (ref 0.50–1.35)

## 2012-03-27 LAB — CBC
Hemoglobin: 13.5 g/dL (ref 13.0–17.0)
MCHC: 34.2 g/dL (ref 30.0–36.0)
Platelets: 188 10*3/uL (ref 150–400)
RBC: 4.41 MIL/uL (ref 4.22–5.81)

## 2012-03-27 LAB — TSH: TSH: 43.889 u[IU]/mL — ABNORMAL HIGH (ref 0.350–4.500)

## 2012-03-27 MED ORDER — LORATADINE 10 MG PO TABS: 10.0000 mg | ORAL_TABLET | Freq: Every day | ORAL | Status: DC

## 2012-03-27 NOTE — Patient Instructions (Addendum)
Come back in 1-2 months for a weight check and to remove the mole on your back  The cough should improve over the next 2 weeks.  Take a loratadine tablet once a day  I will call if the labs are abnormal other wise we will talk about them at your next visit  Eat everything on your plate three times daily

## 2012-03-27 NOTE — Assessment & Plan Note (Signed)
His caregiver and parents are concerned.  They feel he is losing sizes and is not as active.  Normal exam and no red flag symptoms. Will check labs and monitor weight.

## 2012-03-27 NOTE — Assessment & Plan Note (Signed)
Possible melanoma will schedule for punch bx of total lesion

## 2012-03-27 NOTE — Progress Notes (Signed)
  Subjective:    Patient ID: Daniel Holmes, male    DOB: 1971/04/27, 41 y.o.   MRN: [id number]  HPI Here for check up .  Feels well except Dry cough for last few weeks after a cold.  No fever or sputum or shortness of breath or rash  His guardian and parents are concerned about his weight and losing clothes sizes.    Patient reports no  vision/ hearing changes,anorexia, , fever ,adenopathy, persistant / recurrent hoarseness, swallowing issues, chest pain, edema,persistant / recurrent cough, hemoptysis, dyspnea(rest, exertional, paroxysmal nocturnal), gastrointestinal  bleeding (melena, rectal bleeding), abdominal pain, excessive heart burn, GU symptoms(dysuria, hematuria, pyuria, voiding/incontinence  Issues) syncope, focal weakness, severe memory loss, concerning skin lesions, depression, anxiety, abnormal bruising/bleeding, major joint swelling.      Review of Systems     Objective:   Physical Exam Alert interactive slightly hard to understand with speech impediment Neck:  No deformities, thyromegaly, masses, or tenderness noted.   Supple with full range of motion without pain. Heart - Regular rate and rhythm.  No murmurs, gallops or rubs.    Lungs:  Normal respiratory effort, chest expands symmetrically. Lungs are clear to auscultation, no crackles or wheezes. Abdomen: soft and non-tender without masses, organomegaly or hernias noted.  No guarding or rebound Extremities:  No cyanosis, edema, or deformity noted with good range of motion of all major joints.   Mouth - no lesions, mucous membranes are moist, no teeth Nose:  External nasal examination shows no deformity or inflammation. Nasal mucosa are pink and moist without lesions or exudates. No septal dislocation or dislocation.No obstruction to airflow. Skin - in mid back is a 6 mm very dark irregular mole with normal surrounding skin         Assessment & Plan:

## 2012-03-28 DIAGNOSIS — E039 Hypothyroidism, unspecified: Secondary | ICD-10-CM | POA: Insufficient documentation

## 2012-03-28 LAB — LITHIUM LEVEL: Lithium Lvl: 0.9 mEq/L (ref 0.80–1.40)

## 2012-03-28 LAB — T4, FREE: Free T4: 0.6 ng/dL — ABNORMAL LOW (ref 0.80–1.80)

## 2012-03-28 NOTE — Assessment & Plan Note (Signed)
Discovered with recent labs.  Will check thyroid levels

## 2012-03-28 NOTE — Addendum Note (Signed)
Addended by: Pearlean Brownie L on: 03/28/2012 03:53 PM   Modules accepted: Orders

## 2012-04-01 ENCOUNTER — Encounter: Payer: Self-pay | Admitting: Family Medicine

## 2012-04-22 ENCOUNTER — Ambulatory Visit: Payer: Self-pay | Admitting: Family Medicine

## 2012-04-22 ENCOUNTER — Ambulatory Visit (INDEPENDENT_AMBULATORY_CARE_PROVIDER_SITE_OTHER): Payer: Self-pay | Admitting: Family Medicine

## 2012-04-22 ENCOUNTER — Encounter: Payer: Self-pay | Admitting: Family Medicine

## 2012-04-22 VITALS — BP 111/71 | HR 74 | Temp 98.1°F | Ht 67.0 in | Wt 156.0 lb

## 2012-04-22 DIAGNOSIS — D485 Neoplasm of uncertain behavior of skin: Secondary | ICD-10-CM

## 2012-04-22 DIAGNOSIS — E039 Hypothyroidism, unspecified: Secondary | ICD-10-CM

## 2012-04-22 MED ORDER — LEVOTHYROXINE SODIUM 50 MCG PO TABS: 50.0000 ug | ORAL_TABLET | Freq: Every day | ORAL | Status: DC

## 2012-04-22 NOTE — Patient Instructions (Addendum)
If any bleeding put on another bandage on top of the current   If any fever or signs of infection - pain or pus or fever then call us  Leave the bandage on for 48 hours and keep dry.  Then can take off and wash but leave the strips on until they fall off   Come back in 6 weeks for a blood test and follow up  I will send a letter or call about the biopsy results

## 2012-04-22 NOTE — Assessment & Plan Note (Signed)
Send for pathology

## 2012-04-22 NOTE — Assessment & Plan Note (Signed)
Start low dose levothyroxine at 5 mcg and follow

## 2012-04-22 NOTE — Addendum Note (Signed)
Addended by: Pearlean Brownie L on: 04/22/2012 04:45 PM   Modules accepted: Orders

## 2012-04-22 NOTE — Progress Notes (Signed)
  Subjective:    Patient ID: Daniel Holmes, male    DOB: [birth year], 41 y.o.   MRN: [id number]  HPI Hypothyroidism eleveated TSH and low T4 on routine lab check.  He has no symptoms of hypothyroidism   Mole Mid back see previous note    Review of Systems     Objective:   Physical Exam  Procedure Punch Biopsy Time out taken Under sterile conditions area cleaned and anesthetized with 2% lido 1/2 cc.   4 mm Punch bx taken  Silver nitrate applied with good hemostatisis Steri strips applied radially x 3 Dressed with pressure dressing Patient tolerated well          Assessment & Plan:

## 2012-04-24 ENCOUNTER — Telehealth: Payer: Self-pay | Admitting: Family Medicine

## 2012-04-24 NOTE — Telephone Encounter (Signed)
Called caretaker and let know about pathology report. He relates lesion is healing well

## 2012-08-21 ENCOUNTER — Other Ambulatory Visit: Payer: Self-pay | Admitting: Family Medicine

## 2012-08-21 ENCOUNTER — Other Ambulatory Visit: Payer: Self-pay | Admitting: *Deleted

## 2012-08-21 DIAGNOSIS — E039 Hypothyroidism, unspecified: Secondary | ICD-10-CM

## 2012-08-21 MED ORDER — LEVOTHYROXINE SODIUM 50 MCG PO TABS: 50.0000 ug | ORAL_TABLET | Freq: Every day | ORAL | Status: DC

## 2012-08-22 ENCOUNTER — Other Ambulatory Visit: Payer: Self-pay | Admitting: Family Medicine

## 2012-08-22 MED ORDER — LEVOTHYROXINE SODIUM 50 MCG PO TABS: 50.0000 ug | ORAL_TABLET | Freq: Every day | ORAL | Status: DC

## 2012-08-29 ENCOUNTER — Telehealth: Payer: Self-pay | Admitting: Family Medicine

## 2012-08-29 NOTE — Telephone Encounter (Signed)
Left voicemail to come in for an appointment to follow up labs [phone] (M)

## 2012-09-03 ENCOUNTER — Other Ambulatory Visit: Payer: Medicaid Other

## 2012-09-03 ENCOUNTER — Other Ambulatory Visit: Payer: Self-pay

## 2012-09-03 DIAGNOSIS — R7989 Other specified abnormal findings of blood chemistry: Secondary | ICD-10-CM

## 2012-09-03 DIAGNOSIS — E039 Hypothyroidism, unspecified: Secondary | ICD-10-CM

## 2012-09-03 LAB — TSH: TSH: 4.116 u[IU]/mL (ref 0.350–4.500)

## 2012-09-03 NOTE — Telephone Encounter (Addendum)
Called emergency number [phone] Adrian Dinovo left voicemail to come in for blood test

## 2012-09-03 NOTE — Progress Notes (Signed)
TSH,FT4 AND FT3 DONE TODAY Daniel Holmes

## 2012-09-03 NOTE — Telephone Encounter (Signed)
Left voicemail again at mobile number to have Daniel Holmes brought in for blood test

## 2012-09-04 ENCOUNTER — Encounter: Payer: Self-pay | Admitting: Family Medicine

## 2012-09-18 ENCOUNTER — Other Ambulatory Visit: Payer: Self-pay | Admitting: Family Medicine

## 2012-11-04 ENCOUNTER — Ambulatory Visit: Payer: Self-pay | Admitting: Family Medicine

## 2012-11-05 ENCOUNTER — Encounter: Payer: Self-pay | Admitting: Family Medicine

## 2012-11-05 ENCOUNTER — Ambulatory Visit (INDEPENDENT_AMBULATORY_CARE_PROVIDER_SITE_OTHER): Payer: Medicaid Other | Admitting: Family Medicine

## 2012-11-05 VITALS — BP 105/66 | HR 92 | Temp 98.7°F | Ht 67.0 in | Wt 149.0 lb

## 2012-11-05 DIAGNOSIS — J302 Other seasonal allergic rhinitis: Secondary | ICD-10-CM | POA: Insufficient documentation

## 2012-11-05 DIAGNOSIS — J309 Allergic rhinitis, unspecified: Secondary | ICD-10-CM

## 2012-11-05 DIAGNOSIS — E039 Hypothyroidism, unspecified: Secondary | ICD-10-CM

## 2012-11-05 MED ORDER — LORATADINE 10 MG PO TABS: 10.0000 mg | ORAL_TABLET | Freq: Every day | ORAL | Status: DC

## 2012-11-05 MED ORDER — FLUTICASONE PROPIONATE 50 MCG/ACT NA SUSP: 2.0000 | Freq: Every day | NASAL | Status: DC

## 2012-11-05 NOTE — Assessment & Plan Note (Signed)
Will start Claritin and Flonase to treat seasonal allergies. Discussed avoiding allergens.

## 2012-11-05 NOTE — Assessment & Plan Note (Signed)
Last TSH, T3 &4 normal.  Will have him follow up with Dr. Deirdre Priest for medication management.

## 2012-11-05 NOTE — Progress Notes (Signed)
  Subjective:    Patient ID: Daniel Holmes, male    DOB: [birth year], 42 y.o.   MRN: [id number]  HPI  Daniel Holmes come sin for follow up.  There was some confusion, he was supposed to see his regular doctor, Daniel Holmes yesterday to discuss his medication management.  He is on levothyroxine and lithium, which can interact, but TSH in April was normal.  He says he feels ok from an energy stand point.   He complains of nasal congestion, itchy eyes, and cough.  He says the pollen is causing it.  No fevers, shortness of breath, wheezing.    Review of Systems Pertinent items in HPI    Objective:   Physical Exam BP 105/66  Pulse 92  Temp(Src) 98.7 F (37.1 C) (Oral)  Ht 5\' 7"  (1.702 m)  Wt 149 lb (67.586 kg)  BMI 23.33 kg/m2 General appearance: alert, cooperative and no distress Eyes: conjunctivae/corneas clear. PERRL, EOM's intact. Fundi benign. Ears: normal TM's and external ear canals both ears Nose: clear discharge, turbinates pale Throat: oral mucosa moist, no lesions Lungs: clear to auscultation bilaterally Heart: regular rate and rhythm, S1, S2 normal, no murmur, click, rub or gallop       Assessment & Plan:

## 2012-11-05 NOTE — Patient Instructions (Signed)
For your allergies, please start taking claritin, one pill daily.  Also, try nasal saline rinses daily.

## 2012-11-25 ENCOUNTER — Telehealth: Payer: Self-pay | Admitting: Family Medicine

## 2012-11-25 NOTE — Telephone Encounter (Signed)
Mr. Daniel Holmes called and is faxing permission form so he can have a standing order filled out. JW

## 2013-02-24 ENCOUNTER — Other Ambulatory Visit: Payer: Self-pay | Admitting: Family Medicine

## 2013-02-24 MED ORDER — LORATADINE 10 MG PO TABS: 10.0000 mg | ORAL_TABLET | Freq: Every day | ORAL | Status: DC

## 2013-03-03 ENCOUNTER — Ambulatory Visit: Payer: Medicaid Other | Admitting: Family Medicine

## 2013-03-05 ENCOUNTER — Ambulatory Visit: Payer: Medicaid Other | Admitting: Family Medicine

## 2013-03-19 ENCOUNTER — Ambulatory Visit (INDEPENDENT_AMBULATORY_CARE_PROVIDER_SITE_OTHER): Payer: Medicaid Other | Admitting: Family Medicine

## 2013-03-19 ENCOUNTER — Encounter: Payer: Self-pay | Admitting: Family Medicine

## 2013-03-19 VITALS — BP 110/70 | HR 74 | Temp 98.5°F | Ht 67.0 in | Wt 155.0 lb

## 2013-03-19 DIAGNOSIS — Z23 Encounter for immunization: Secondary | ICD-10-CM

## 2013-03-19 DIAGNOSIS — E039 Hypothyroidism, unspecified: Secondary | ICD-10-CM

## 2013-03-19 DIAGNOSIS — Z79899 Other long term (current) drug therapy: Secondary | ICD-10-CM

## 2013-03-19 LAB — BASIC METABOLIC PANEL
BUN: 10 mg/dL (ref 6–23)
Chloride: 107 mEq/L (ref 96–112)
Creat: 0.96 mg/dL (ref 0.50–1.35)
Glucose, Bld: 79 mg/dL (ref 70–99)
Potassium: 4.5 mEq/L (ref 3.5–5.3)

## 2013-03-19 NOTE — Assessment & Plan Note (Signed)
Stable clinically - check labs

## 2013-03-19 NOTE — Patient Instructions (Signed)
I will call if anything is wrong with the labs other wise I will send a letter  Keep taking all medications as you were

## 2013-03-19 NOTE — Progress Notes (Signed)
  Subjective:    Patient ID: Daniel Holmes, male    DOB: 08-Jul-1970, 42 y.o.   MRN: [id number]  HPI  For regular exam   Mental Health  On lithium and zyprexa  HYPOTHYROIDISM Disease Monitoring Weight changes: no  Skin Changes: no Palpitations: no Heat/Cold intolerance: no  Medication Monitoring Compliance:  Takes daily - lives in group home   Last TSH:   Lab Results  Component Value Date   TSH 4.116 09/03/2012   Patient reports no  vision/ hearing changes,anorexia, weight change, fever ,adenopathy, persistant / recurrent hoarseness, swallowing issues, chest pain, edema,persistant / recurrent cough, hemoptysis, dyspnea(rest, exertional, paroxysmal nocturnal), gastrointestinal  bleeding (melena, rectal bleeding), abdominal pain, excessive heart burn, GU symptoms(dysuria, hematuria, pyuria, voiding/incontinence  Issues) syncope, focal weakness, severe memory loss, concerning skin lesions, depression, anxiety, abnormal bruising/bleeding, major joint swelling.     Review of Systems     Objective:   Physical Exam No acute distress Heart - Regular rate and rhythm.  No murmurs, gallops or rubs.    Lungs:  Normal respiratory effort, chest expands symmetrically. Lungs are clear to auscultation, no crackles or wheezes. Extremities:  No cyanosis, edema, or deformity noted with good range of motion of all major joints.   Abdomen: soft and non-tender without masses, organomegaly or hernias noted.  No guarding or rebound Neck:  No deformities, thyromegaly, masses, or tenderness noted.   Supple with full range of motion without pain.        Assessment & Plan:

## 2013-03-20 ENCOUNTER — Encounter: Payer: Self-pay | Admitting: Family Medicine

## 2013-06-02 ENCOUNTER — Other Ambulatory Visit: Payer: Self-pay | Admitting: Family Medicine

## 2013-06-03 ENCOUNTER — Other Ambulatory Visit: Payer: Self-pay | Admitting: Family Medicine

## 2013-06-03 MED ORDER — LEVOTHYROXINE SODIUM 50 MCG PO TABS: 50.0000 ug | ORAL_TABLET | Freq: Every day | ORAL | Status: DC

## 2013-06-14 ENCOUNTER — Emergency Department: Payer: Self-pay | Admitting: Internal Medicine

## 2013-06-22 ENCOUNTER — Emergency Department: Payer: Self-pay | Admitting: Emergency Medicine

## 2013-06-22 LAB — CBC
HCT: 37.4 % — ABNORMAL LOW (ref 40.0–52.0)
HGB: 12.9 g/dL — AB (ref 13.0–18.0)
MCH: 32.3 pg (ref 26.0–34.0)
MCHC: 34.4 g/dL (ref 32.0–36.0)
MCV: 94 fL (ref 80–100)
Platelet: 165 10*3/uL (ref 150–440)
RBC: 3.98 10*6/uL — AB (ref 4.40–5.90)
RDW: 12.5 % (ref 11.5–14.5)
WBC: 9.1 10*3/uL (ref 3.8–10.6)

## 2013-06-22 LAB — COMPREHENSIVE METABOLIC PANEL
ALBUMIN: 4.2 g/dL (ref 3.4–5.0)
ALK PHOS: 79 U/L
ANION GAP: 2 — AB (ref 7–16)
BILIRUBIN TOTAL: 0.5 mg/dL (ref 0.2–1.0)
BUN: 11 mg/dL (ref 7–18)
CALCIUM: 9.5 mg/dL (ref 8.5–10.1)
Chloride: 107 mmol/L (ref 98–107)
Co2: 27 mmol/L (ref 21–32)
Creatinine: 0.92 mg/dL (ref 0.60–1.30)
EGFR (African American): >60
EGFR (Non-African Amer.): >60
Glucose: 100 mg/dL — ABNORMAL HIGH (ref 65–99)
Osmolality: 271 (ref 275–301)
POTASSIUM: 4.1 mmol/L (ref 3.5–5.1)
SGOT(AST): 41 U/L — ABNORMAL HIGH (ref 15–37)
SGPT (ALT): 56 U/L (ref 12–78)
SODIUM: 136 mmol/L (ref 136–145)
Total Protein: 7.2 g/dL (ref 6.4–8.2)

## 2013-07-03 ENCOUNTER — Ambulatory Visit: Payer: Self-pay | Admitting: Gastroenterology

## 2013-07-21 ENCOUNTER — Encounter: Payer: Self-pay | Admitting: Family Medicine

## 2013-07-21 ENCOUNTER — Ambulatory Visit (INDEPENDENT_AMBULATORY_CARE_PROVIDER_SITE_OTHER): Payer: Medicaid Other | Admitting: Family Medicine

## 2013-07-21 VITALS — BP 127/78 | HR 73 | Temp 97.7°F | Ht 67.0 in | Wt 171.0 lb

## 2013-07-21 DIAGNOSIS — M25569 Pain in unspecified knee: Secondary | ICD-10-CM

## 2013-07-21 MED ORDER — ACETAMINOPHEN 325 MG PO TABS: 650.0000 mg | ORAL_TABLET | Freq: Three times a day (TID) | ORAL | Status: DC | PRN

## 2013-07-21 NOTE — Patient Instructions (Signed)
Good to see you today!  Thanks for coming in.  Use the knee sleeve as you need it on the knee that is hurting the most  Use 2 tabs tylenol up to every 8 hours  Come back in 6 month for blood tests

## 2013-07-21 NOTE — Assessment & Plan Note (Signed)
Consistent with mild DJD with some psychiatric overlay.  Will treat with tylenol (no nsaids given lithium) and knee sleeve. Did not prescribe brace or cane that he wanted

## 2013-07-21 NOTE — Progress Notes (Signed)
   Subjective:    Patient ID: Daniel Holmes, male    DOB: 1970-09-09, 43 y.o.   MRN: [id number]  HPI  Daniel Holmes  JOINT PAIN Location: both knees but comes and goes .   Course:  Waxing waning Worse with:  moving Better with:  Tylenol helps a little.  He would like a cane Trauma: no Swelling:  no Locking:  no Other Joints involved:  no Rash: no Fever:  no  Review of Symptoms - see HPI  PMH - Smoking status noted.     Review of Systems     Objective:   Physical Exam  Knees - both FROM without effusion or deformity.  Able to stand on each alone an pivot without pain but does have pain with ROM without weight bearing.  Good ROM in hips and ankle without pain Skin:  Intact without suspicious lesions or rashes       Assessment & Plan:

## 2013-08-12 ENCOUNTER — Telehealth: Payer: Self-pay | Admitting: Family Medicine

## 2013-08-13 ENCOUNTER — Telehealth: Payer: Self-pay | Admitting: Family Medicine

## 2013-08-13 ENCOUNTER — Ambulatory Visit: Payer: Medicaid Other | Admitting: Family Medicine

## 2013-08-13 NOTE — Telephone Encounter (Signed)
FL2 dropped off to be signed.  Please call group home manager when completed.

## 2013-08-14 NOTE — Telephone Encounter (Signed)
Placed in MDs box. Daniel Holmes  

## 2013-08-15 NOTE — Telephone Encounter (Signed)
Left voice message form is ready for pick up, but two additional questions needed to be answered regarding level of care.  Derl Barrow, RN

## 2013-08-15 NOTE — Telephone Encounter (Signed)
Signed and returned to T

## 2013-10-01 ENCOUNTER — Encounter: Payer: Medicaid Other | Admitting: Family Medicine

## 2013-10-15 ENCOUNTER — Encounter: Payer: Medicaid Other | Admitting: Family Medicine

## 2013-11-10 ENCOUNTER — Ambulatory Visit: Payer: Medicaid Other | Admitting: Family Medicine

## 2013-11-17 ENCOUNTER — Ambulatory Visit: Payer: Medicaid Other | Admitting: Family Medicine

## 2013-11-25 ENCOUNTER — Other Ambulatory Visit: Payer: Self-pay | Admitting: Family Medicine

## 2013-11-26 ENCOUNTER — Other Ambulatory Visit: Payer: Self-pay | Admitting: *Deleted

## 2013-11-26 MED ORDER — LORATADINE 10 MG PO TABS: 10.0000 mg | ORAL_TABLET | Freq: Every day | ORAL | Status: DC | PRN

## 2014-03-27 ENCOUNTER — Other Ambulatory Visit: Payer: Self-pay | Admitting: *Deleted

## 2014-03-27 MED ORDER — LEVOTHYROXINE SODIUM 50 MCG PO TABS: 50.0000 ug | ORAL_TABLET | Freq: Every day | ORAL | Status: DC

## 2014-04-20 ENCOUNTER — Encounter: Payer: Medicaid Other | Admitting: Family Medicine

## 2014-05-04 ENCOUNTER — Other Ambulatory Visit: Payer: Self-pay | Admitting: Family Medicine

## 2014-05-04 ENCOUNTER — Other Ambulatory Visit: Payer: Self-pay | Admitting: *Deleted

## 2014-05-04 MED ORDER — LEVOTHYROXINE SODIUM 50 MCG PO TABS: 50.0000 ug | ORAL_TABLET | Freq: Every day | ORAL | Status: DC

## 2014-05-04 NOTE — Telephone Encounter (Signed)
Pt out of medication.  Trentyn Boisclair L, RN  

## 2014-05-19 ENCOUNTER — Encounter: Payer: Medicaid Other | Admitting: Family Medicine

## 2014-06-10 ENCOUNTER — Ambulatory Visit (INDEPENDENT_AMBULATORY_CARE_PROVIDER_SITE_OTHER): Payer: Medicaid Other | Admitting: Family Medicine

## 2014-06-10 ENCOUNTER — Encounter: Payer: Self-pay | Admitting: Family Medicine

## 2014-06-10 VITALS — BP 118/81 | HR 69 | Temp 98.6°F | Ht 67.0 in | Wt 169.0 lb

## 2014-06-10 DIAGNOSIS — E032 Hypothyroidism due to medicaments and other exogenous substances: Secondary | ICD-10-CM

## 2014-06-10 DIAGNOSIS — M25561 Pain in right knee: Secondary | ICD-10-CM

## 2014-06-10 LAB — TSH: TSH: 6.97 u[IU]/mL — ABNORMAL HIGH (ref 0.350–4.500)

## 2014-06-10 MED ORDER — ACETAMINOPHEN 500 MG PO TABS
500.0000 mg | ORAL_TABLET | Freq: Once | ORAL | Status: AC
  Administered 2014-06-10: 500 mg via ORAL

## 2014-06-10 MED ORDER — ACETAMINOPHEN 500 MG PO TABS: 1000.0000 mg | ORAL_TABLET | Freq: Two times a day (BID) | ORAL | Status: DC | PRN

## 2014-06-10 MED ORDER — BENZONATATE 100 MG PO CAPS: 100.0000 mg | ORAL_CAPSULE | Freq: Two times a day (BID) | ORAL | Status: DC | PRN

## 2014-06-10 NOTE — Addendum Note (Signed)
Addended by: Christen Bame D on: 06/10/2014 10:22 AM   Modules accepted: Orders

## 2014-06-10 NOTE — Assessment & Plan Note (Signed)
Consistent with mild DJD.  No signs of internal derangement.  Increase dose of tylenol.  Cant take NSAIDs due to psych medications.  If persists may try topical NSAID.

## 2014-06-10 NOTE — Assessment & Plan Note (Signed)
Check TSH Clinically stable

## 2014-06-10 NOTE — Patient Instructions (Signed)
Good to see you today!  Thanks for coming in.  Knee Pain - take the acetominophen 1000 mg twice daily as needed for pain Do knee exercises - 15 times of straight leg pushes - four times a day If not better in 1-2 months then come back and bring your brace  Cough - try the tessalon perles twice daily as needed  We will measure your thyroid today.  I will call you if your tests are not good.  Otherwise I will send you a letter.  If you do not hear from me with in 2 weeks please call our office.

## 2014-06-10 NOTE — Progress Notes (Signed)
   Subjective:    Patient ID: Daniel Holmes, male    DOB: 10-Nov-1970, 44 y.o.   MRN: [id number]  HPI  Knee pain - Right knee has had on and off for several years. Taking 650 tylenol twice daily and wearing unknown knee brace.  No soft tissue swelling or redness or giving out.  Does not limit his activity but does hurt.  No other joint pain or rashes  Cough - mild nonproductive for last ffew days.  No fever or sputum or shortness of breath or chest pain  HYPOTHYROIDISM Disease Monitoring Weight changes: no  Skin Changes: no Palpitations: no Heat/Cold intolerance: no  Medication Monitoring Compliance:  Daily    Last TSH:   Lab Results  Component Value Date   TSH 2.700 03/19/2013   Chief Complaint noted Review of Symptoms - see HPI PMH - Smoking status noted.   Vital Signs reviewed   Review of Systems     Objective:   Physical Exam  Alert no acute distress Heart - Regular rate and rhythm.  No murmurs, gallops or rubs.    Lungs:  Normal respiratory effort, chest expands symmetrically. Lungs are clear to auscultation, no crackles or wheezes. Neck:  No deformities, thyromegaly, masses, or tenderness noted.   Supple with full range of motion without pain. Knee - R mild vastus medialis weakness.  FROM No effusion . MIldly diffusely tender.  Mild pain in right ankle with FROM.  No hip pain       Assessment & Plan:    Cough - mild URI. --  Tesalon perles

## 2014-06-12 ENCOUNTER — Encounter: Payer: Self-pay | Admitting: Family Medicine

## 2014-06-12 MED ORDER — LEVOTHYROXINE SODIUM 75 MCG PO TABS: 75.0000 ug | ORAL_TABLET | Freq: Every day | ORAL | Status: DC

## 2014-06-12 NOTE — Addendum Note (Signed)
Addended by: Talbert Cage L on: 06/12/2014 02:20 PM   Modules accepted: Orders

## 2014-08-11 ENCOUNTER — Telehealth: Payer: Self-pay | Admitting: Family Medicine

## 2014-08-11 NOTE — Telephone Encounter (Signed)
Calling to see if on correct thyroid dose.  Called mobile [phone]  - no answer - no message service  Called [phone] - no answer - no message service  Called [phone] Jaion Lagrange - no answer - left message to call us

## 2014-08-12 NOTE — Telephone Encounter (Signed)
Spoke with Ms. Daniel Holmes and informed her that we were unable to reach anyone by phone and that is why B.Schoffstall was called.  Need to know current dose of thyroid medication patient is taking, but she is not at the group home currently.  Ms. Daniel Holmes will call back when she gets back. Jenesis Martin,CMA

## 2014-08-12 NOTE — Telephone Encounter (Signed)
Ms Daniel Holmes called. Father Bernerd Terhune called her upset he was contacted about his son. Ms Daniel Holmes is the group home adm and is the contact for pt

## 2014-08-28 ENCOUNTER — Encounter: Payer: Self-pay | Admitting: Family Medicine

## 2014-08-28 NOTE — Progress Notes (Signed)
Caregiver dropped off form to be filled out.  Please call her at [phone] when completed.

## 2014-08-28 NOTE — Progress Notes (Signed)
Paperwork placed in MD box for review. Lamia Mariner, CMA.

## 2014-09-01 NOTE — Telephone Encounter (Signed)
FL2 left by Charlsie Quest from [place] indicates is on correct 75 mcg dose of thyroid

## 2014-09-02 NOTE — Progress Notes (Signed)
Completed returned to [place]

## 2014-10-16 ENCOUNTER — Other Ambulatory Visit: Payer: Self-pay | Admitting: *Deleted

## 2014-10-16 MED ORDER — BENZONATATE 100 MG PO CAPS: 100.0000 mg | ORAL_CAPSULE | Freq: Two times a day (BID) | ORAL | Status: DC | PRN

## 2014-10-16 NOTE — Telephone Encounter (Signed)
Refill request for Ranitidine 150 mg tablet is not list on current medication list.  Derl Barrow, RN

## 2014-11-02 ENCOUNTER — Encounter: Payer: Self-pay | Admitting: Emergency Medicine

## 2014-11-02 ENCOUNTER — Emergency Department
Admission: EM | Admit: 2014-11-02 | Discharge: 2014-11-03 | Disposition: A | Discharge status: Other Acute Inpatient Hospital | Payer: Medicaid Other | Attending: Emergency Medicine | Admitting: Emergency Medicine

## 2014-11-02 DIAGNOSIS — Z880 Allergy status to penicillin: Secondary | ICD-10-CM | POA: Diagnosis not present

## 2014-11-02 DIAGNOSIS — F319 Bipolar disorder, unspecified: Secondary | ICD-10-CM | POA: Insufficient documentation

## 2014-11-02 DIAGNOSIS — F29 Unspecified psychosis not due to a substance or known physiological condition: Secondary | ICD-10-CM | POA: Insufficient documentation

## 2014-11-02 DIAGNOSIS — Z87891 Personal history of nicotine dependence: Secondary | ICD-10-CM | POA: Insufficient documentation

## 2014-11-02 DIAGNOSIS — Z79899 Other long term (current) drug therapy: Secondary | ICD-10-CM | POA: Diagnosis not present

## 2014-11-02 DIAGNOSIS — F3112 Bipolar disorder, current episode manic without psychotic features, moderate: Secondary | ICD-10-CM | POA: Diagnosis not present

## 2014-11-02 DIAGNOSIS — E039 Hypothyroidism, unspecified: Secondary | ICD-10-CM | POA: Diagnosis present

## 2014-11-02 LAB — URINE DRUG SCREEN, QUALITATIVE (ARMC ONLY)
AMPHETAMINES, UR SCREEN: NOT DETECTED
BARBITURATES, UR SCREEN: NOT DETECTED
Benzodiazepine, Ur Scrn: NOT DETECTED
COCAINE METABOLITE, UR ~~LOC~~: NOT DETECTED
Cannabinoid 50 Ng, Ur ~~LOC~~: NOT DETECTED
MDMA (Ecstasy)Ur Screen: NOT DETECTED
METHADONE SCREEN, URINE: NOT DETECTED
OPIATE, UR SCREEN: NOT DETECTED
PHENCYCLIDINE (PCP) UR S: NOT DETECTED
TRICYCLIC, UR SCREEN: NOT DETECTED

## 2014-11-02 LAB — COMPREHENSIVE METABOLIC PANEL
ALBUMIN: 4.8 g/dL (ref 3.5–5.0)
ALT: 23 U/L (ref 17–63)
AST: 34 U/L (ref 15–41)
Alkaline Phosphatase: 71 U/L (ref 38–126)
Anion gap: 7 (ref 5–15)
BUN: 8 mg/dL (ref 6–20)
CO2: 26 mmol/L (ref 22–32)
Calcium: 10 mg/dL (ref 8.9–10.3)
Chloride: 109 mmol/L (ref 101–111)
Creatinine, Ser: 0.88 mg/dL (ref 0.61–1.24)
GFR calc Af Amer: >60 mL/min (ref >60)
GLUCOSE: 108 mg/dL — AB (ref 65–99)
POTASSIUM: 4.1 mmol/L (ref 3.5–5.1)
Sodium: 142 mmol/L (ref 135–145)
Total Bilirubin: 0.4 mg/dL (ref 0.3–1.2)
Total Protein: 7.7 g/dL (ref 6.5–8.1)

## 2014-11-02 LAB — ACETAMINOPHEN LEVEL

## 2014-11-02 LAB — CBC
HCT: 40.8 % (ref 40.0–52.0)
HEMOGLOBIN: 13.6 g/dL (ref 13.0–18.0)
MCH: 31.9 pg (ref 26.0–34.0)
MCHC: 33.4 g/dL (ref 32.0–36.0)
MCV: 95.5 fL (ref 80.0–100.0)
PLATELETS: 182 10*3/uL (ref 150–440)
RBC: 4.27 MIL/uL — ABNORMAL LOW (ref 4.40–5.90)
RDW: 12.9 % (ref 11.5–14.5)
WBC: 7.8 10*3/uL (ref 3.8–10.6)

## 2014-11-02 LAB — ETHANOL

## 2014-11-02 LAB — SALICYLATE LEVEL: Salicylate Lvl: <4 mg/dL (ref 2.8–30.0)

## 2014-11-02 MED ORDER — OLANZAPINE 10 MG PO TABS
10.0000 mg | ORAL_TABLET | Freq: Every day | ORAL | Status: DC
  Administered 2014-11-02: 10 mg via ORAL

## 2014-11-02 MED ORDER — CLONIDINE HCL 0.1 MG PO TABS
0.1000 mg | ORAL_TABLET | Freq: Once | ORAL | Status: AC
  Administered 2014-11-02: 0.1 mg via ORAL

## 2014-11-02 MED ORDER — CLONIDINE HCL 0.1 MG PO TABS
ORAL_TABLET | ORAL | Status: AC
  Filled 2014-11-02: qty 1

## 2014-11-02 MED ORDER — ACETAMINOPHEN 325 MG PO TABS
650.0000 mg | ORAL_TABLET | Freq: Once | ORAL | Status: AC
  Administered 2014-11-02: 650 mg via ORAL

## 2014-11-02 MED ORDER — ACETAMINOPHEN 325 MG PO TABS
ORAL_TABLET | ORAL | Status: AC
  Filled 2014-11-02: qty 2

## 2014-11-02 MED ORDER — LORAZEPAM 2 MG PO TABS
2.0000 mg | ORAL_TABLET | Freq: Once | ORAL | Status: AC
  Administered 2014-11-02: 2 mg via ORAL

## 2014-11-02 MED ORDER — LORAZEPAM 2 MG PO TABS
ORAL_TABLET | ORAL | Status: AC
  Filled 2014-11-02: qty 1

## 2014-11-02 MED ORDER — OLANZAPINE 10 MG PO TABS
ORAL_TABLET | ORAL | Status: AC
  Filled 2014-11-02: qty 1

## 2014-11-02 NOTE — ED Notes (Signed)

## 2014-11-02 NOTE — ED Notes (Signed)
.  Pt. transfered to BHU without incident after report from. Placed in room and oriented to unit. Pt. informed that for their safety all care areas are designed for safety and monitored by security cameras at all times; and visiting hours explained to patient. Patient verbalizes understanding, and verbal contract for safety obtained.   

## 2014-11-02 NOTE — ED Notes (Signed)
PT  PUT  UNDER  IVC  PER  DR  QUALE  ALL  IVC  PAPERWORK  ON  CHART  PENDING   CONSULT

## 2014-11-02 NOTE — ED Notes (Signed)

## 2014-11-02 NOTE — ED Notes (Signed)
BEHAVIORAL HEALTH ROUNDING Patient sleeping: No. Patient alert and oriented: yes Behavior appropriate: Yes.  ;  Nutrition and fluids offered: Yes  Toileting and hygiene offered: Yes  Sitter present: yes Law enforcement present: Yes  

## 2014-11-02 NOTE — ED Notes (Signed)
meds given per md order meal tray given

## 2014-11-02 NOTE — ED Notes (Signed)
Pt calm and cooperative at this time without complaints, no complaints of pain or discomfort will continue to monitor.
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
## 2014-11-02 NOTE — ED Provider Notes (Signed)
[facility] Emergency Department Provider Note  ____________________________________________  Time seen: Approximately 1:00 PM  I have reviewed the triage vital signs and the nursing notes.   HISTORY  Chief Complaint Suicidal    HPI Daniel Holmes is a 44 y.o. male with a history of bipolar disorder. The patient comes in today because he states he wants to stab himself with a knife if he cannot leave his group home. He states he does not currently like the group home he is at because they monitor his behavior.  He denies any actual attempt to hurt himself or others. He states that he get in a different group home this would be a problem. He denies being in pain. Does have a known history of psychiatric disease. States that the symptoms started today.   Past Medical History  Diagnosis Date  . Psychosis     See Dr Daniel Holmes Daniel Holmes Daniel Holmes    Patient Active Problem List   Diagnosis Date Noted  . Knee pain 07/21/2013  . Allergic rhinitis 11/05/2012  . Hypothyroidism 03/28/2012  . Encounter for long-term (current) use of other medications 06/21/2011  . 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 PSYCHOSIS, UNSPECIFIED 07/26/2006  . ATTENTION DEFICIT, W/O HYPERACTIVITY 07/26/2006  . MENTAL RETARDATION 07/26/2006    History reviewed. No pertinent past surgical history.  Current Outpatient Rx  Name  Route  Sig  Dispense  Refill  . acetaminophen (TYLENOL) 500 MG tablet   Oral   Take 2 tablets (1,000 mg total) by mouth 2 (two) times daily as needed.   60 tablet   11   . benzonatate (TESSALON) 100 MG capsule   Oral   Take 1 capsule (100 mg total) by mouth 2 (two) times daily as needed for cough.   10 capsule   0   . cloNIDine (CATAPRES) 0.1 MG tablet   Oral   Take 0.1 mg by mouth at bedtime.         . hydrOXYzine (ATARAX/VISTARIL) 25 MG tablet   Oral   Take 25 mg by mouth 3 (three) times daily as needed for  itching.         . levothyroxine (SYNTHROID, LEVOTHROID) 75 MCG tablet   Oral   Take 1 tablet (75 mcg total) by mouth daily.   90 tablet   1   . lithium 300 MG tablet   Oral   Take 600 mg by mouth 2 (two) times daily. Per Dr Daniel Holmes Psychiatry         . loratadine (CLARITIN) 10 MG tablet   Oral   Take 1 tablet (10 mg total) by mouth daily as needed for allergies.   30 tablet   11   . Multiple Vitamins-Minerals (THERATRUM COMPLETE PO)   Oral   Take 1 tablet by mouth daily.           Marland Kitchen OLANZapine (ZYPREXA) 10 MG tablet   Oral   Take 10 mg by mouth at bedtime.         . pantoprazole (PROTONIX) 40 MG tablet   Oral   Take 40 mg by mouth daily.         . traZODone (DESYREL) 150 MG tablet   Oral   Take 150 mg by mouth at bedtime.           Allergies Penicillins  History reviewed. No pertinent family history.  Social History History  Substance Use Topics  . Smoking status:  Former Smoker  . Smokeless tobacco: Not on file  . Alcohol Use: No    Review of Systems Constitutional: No fever/chills Eyes: No visual changes. ENT: No sore throat. Cardiovascular: Denies chest pain. Respiratory: Denies shortness of breath. Gastrointestinal: No abdominal pain.  No nausea, no vomiting.  No diarrhea.  No constipation. Genitourinary: Negative for dysuria. Musculoskeletal: Negative for back pain. Skin: Negative for rash. Neurological: Negative for headaches, focal weakness or numbness. Does speak with the drawl, and slight slurring of his speech but this is normal per him.  Denies hallucinations or wanting to hurt others. He states he'll stab himself.  10-point ROS otherwise negative.  ____________________________________________   PHYSICAL EXAM:  VITAL SIGNS: ED Triage Vitals  Enc Vitals Group     BP 11/02/14 1222 119/79 mmHg     Pulse Rate 11/02/14 1222 99     Resp 11/02/14 1222 20     Temp 11/02/14 1222 98.2 F (36.8 C)     Temp Source 11/02/14 1222  Oral     SpO2 11/02/14 1222 99 %     Weight 11/02/14 1222 175 lb (79.379 kg)     Height 11/02/14 1222 5\' 9"  (1.753 m)     Head Cir --      Peak Flow --      Pain Score 11/02/14 1223 0     Pain Loc --      Pain Edu? --      Excl. in [place]? --     Constitutional: Alert and oriented. Well appearing and in no acute distress. Eyes: Conjunctivae are normal. PERRL. EOMI. Head: Atraumatic. Nose: No congestion/rhinnorhea. Mouth/Throat: Mucous membranes are moist.  Oropharynx non-erythematous. Neck: No stridor.   Cardiovascular: Normal rate, regular rhythm. Grossly normal heart sounds.  Good peripheral circulation. Respiratory: Normal respiratory effort.  No retractions. Lungs CTAB. Gastrointestinal: Soft and nontender. No distention. No abdominal bruits. No CVA tenderness. Musculoskeletal: No lower extremity tenderness nor edema.  No joint effusions. Neurologic:  Normal speech and language. No gross focal neurologic deficits are appreciated. Speech is normal. No gait instability. Skin:  Skin is warm, dry and intact. No rash noted. Psychiatric: Mood and affect are flat. Does speak with the drawl. Slight slurring of his speech, which is states is normal. He is fully alert. He doesn't dorsi feeling suicidal if he has to go back to the same group home. Plan is to stab himself with a kitchen knife.  ____________________________________________   LABS (all labs ordered are listed, but only abnormal results are displayed)  Labs Reviewed  CBC - Abnormal; Notable for the following:    RBC 4.27 (*)    All other components within normal limits  COMPREHENSIVE METABOLIC PANEL  ETHANOL  ACETAMINOPHEN LEVEL  SALICYLATE LEVEL  URINE DRUG SCREEN, QUALITATIVE ([place])   ____________________________________________  EKG   ____________________________________________  RADIOLOGY   ____________________________________________   PROCEDURES  Procedure(s) performed: None  Critical Care  performed: No  ____________________________________________   INITIAL IMPRESSION / ASSESSMENT AND PLAN / ED COURSE  Pertinent labs & imaging results that were available during my care of the patient were reviewed by me and considered in my medical decision making (see chart for details).  Behavioral disturbance, with desired to injure himself. He does not show any evidence of acute psychosis at this time, but rather is likely related to his pre-existing psychiatric disease. He is medically clear for psychiatric evaluation.  I have ordered psychiatric consultation and TTS evaluation. Because of his desire to stab  himself and access presumptively to knives at the group home I have placed him on IVC. ____________________________________________   FINAL CLINICAL IMPRESSION(S) / ED DIAGNOSES  Bipolar disorder, acute exacerbation, initial,   Delman Kitten, MD 11/02/14 1437

## 2014-11-02 NOTE — ED Notes (Signed)
Transfer to BHU  

## 2014-11-02 NOTE — ED Notes (Signed)
Pt laying in bed.  

## 2014-11-02 NOTE — ED Notes (Signed)
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
## 2014-11-02 NOTE — ED Notes (Signed)
ED BHU [facility] Is the patient under IVC or is there intent for IVC: Yes.   Is the patient medically cleared: Yes.   Is there vacancy in the ED BHU: Yes.   Is the population mix appropriate for patient: Yes.   Is the patient awaiting placement in inpatient or outpatient setting: Yes.   Has the patient had a psychiatric consult: Yes.   Survey of unit performed for contraband, proper placement and condition of furniture, tampering with fixtures in bathroom, shower, and each patient room: Yes.  ; Findings:  APPEARANCE/BEHAVIOR calm, cooperative and adequate rapport can be established NEURO ASSESSMENT Orientation: time, place and person Hallucinations: Yes.  Auditory Hallucinations Speech: Normal Gait: normal RESPIRATORY ASSESSMENT Normal expansion.  Clear to auscultation.  No rales, rhonchi, or wheezing. CARDIOVASCULAR ASSESSMENT regular rate and rhythm, S1, S2 normal, no murmur, click, rub or gallop GASTROINTESTINAL ASSESSMENT soft, nontender, BS WNL, no r/g EXTREMITIES normal strength, tone, and muscle mass PLAN OF CARE Provide calm/safe environment. Vital signs assessed twice daily. ED BHU Assessment once each 12-hour shift. Collaborate with intake RN daily or as condition indicates. Assure the ED provider has rounded once each shift. Provide and encourage hygiene. Provide redirection as needed. Assess for escalating behavior; address immediately and inform ED provider.  Assess family dynamic and appropriateness for visitation as needed: Yes.  ; If necessary, describe findings:  Educate the patient/family about BHU procedures/visitation: Yes.  ; If necessary, describe findings:

## 2014-11-03 ENCOUNTER — Inpatient Hospital Stay
Admission: EM | Admit: 2014-11-03 | Discharge: 2014-11-05 | DRG: 885 | Disposition: A | Discharge status: Home / Self Care | Payer: Medicaid Other | Source: Intra-hospital | Attending: Psychiatry | Admitting: Psychiatry

## 2014-11-03 DIAGNOSIS — F70 Mild intellectual disabilities: Secondary | ICD-10-CM | POA: Diagnosis present

## 2014-11-03 DIAGNOSIS — R45851 Suicidal ideations: Secondary | ICD-10-CM | POA: Diagnosis present

## 2014-11-03 DIAGNOSIS — Z8659 Personal history of other mental and behavioral disorders: Secondary | ICD-10-CM

## 2014-11-03 DIAGNOSIS — Z79899 Other long term (current) drug therapy: Secondary | ICD-10-CM

## 2014-11-03 DIAGNOSIS — F800 Phonological disorder: Secondary | ICD-10-CM | POA: Diagnosis present

## 2014-11-03 DIAGNOSIS — J302 Other seasonal allergic rhinitis: Secondary | ICD-10-CM | POA: Diagnosis present

## 2014-11-03 DIAGNOSIS — E559 Vitamin D deficiency, unspecified: Secondary | ICD-10-CM | POA: Diagnosis present

## 2014-11-03 DIAGNOSIS — K219 Gastro-esophageal reflux disease without esophagitis: Secondary | ICD-10-CM | POA: Diagnosis present

## 2014-11-03 DIAGNOSIS — F17210 Nicotine dependence, cigarettes, uncomplicated: Secondary | ICD-10-CM | POA: Diagnosis present

## 2014-11-03 DIAGNOSIS — F315 Bipolar disorder, current episode depressed, severe, with psychotic features: Principal | ICD-10-CM | POA: Diagnosis present

## 2014-11-03 DIAGNOSIS — G8929 Other chronic pain: Secondary | ICD-10-CM | POA: Diagnosis present

## 2014-11-03 DIAGNOSIS — F909 Attention-deficit hyperactivity disorder, unspecified type: Secondary | ICD-10-CM | POA: Diagnosis present

## 2014-11-03 DIAGNOSIS — F3130 Bipolar disorder, current episode depressed, mild or moderate severity, unspecified: Secondary | ICD-10-CM | POA: Diagnosis not present

## 2014-11-03 DIAGNOSIS — E039 Hypothyroidism, unspecified: Secondary | ICD-10-CM | POA: Diagnosis present

## 2014-11-03 DIAGNOSIS — M1990 Unspecified osteoarthritis, unspecified site: Secondary | ICD-10-CM | POA: Diagnosis present

## 2014-11-03 DIAGNOSIS — F319 Bipolar disorder, unspecified: Secondary | ICD-10-CM | POA: Diagnosis present

## 2014-11-03 MED ORDER — ACETAMINOPHEN 325 MG PO TABS
650.0000 mg | ORAL_TABLET | Freq: Four times a day (QID) | ORAL | Status: DC | PRN
  Administered 2014-11-03 – 2014-11-04 (×4): 650 mg via ORAL
  Filled 2014-11-03 (×4): qty 2

## 2014-11-03 MED ORDER — PANTOPRAZOLE SODIUM 40 MG PO TBEC
40.0000 mg | DELAYED_RELEASE_TABLET | Freq: Every day | ORAL | Status: DC
  Administered 2014-11-03 – 2014-11-05 (×3): 40 mg via ORAL
  Filled 2014-11-03 (×3): qty 1

## 2014-11-03 MED ORDER — LITHIUM CARBONATE ER 450 MG PO TBCR
450.0000 mg | EXTENDED_RELEASE_TABLET | Freq: Two times a day (BID) | ORAL | Status: DC
  Administered 2014-11-03 – 2014-11-05 (×5): 450 mg via ORAL
  Filled 2014-11-03 (×5): qty 1

## 2014-11-03 MED ORDER — ALUM & MAG HYDROXIDE-SIMETH 200-200-20 MG/5ML PO SUSP: 30.0000 mL | ORAL | Status: DC | PRN

## 2014-11-03 MED ORDER — LORATADINE 10 MG PO TABS
10.0000 mg | ORAL_TABLET | Freq: Every day | ORAL | Status: DC
  Administered 2014-11-03 – 2014-11-05 (×3): 10 mg via ORAL
  Filled 2014-11-03 (×3): qty 1

## 2014-11-03 MED ORDER — MAGNESIUM HYDROXIDE 400 MG/5ML PO SUSP: 30.0000 mL | Freq: Every day | ORAL | Status: DC | PRN

## 2014-11-03 MED ORDER — NICOTINE 14 MG/24HR TD PT24: 14.0000 mg | MEDICATED_PATCH | Freq: Every day | TRANSDERMAL | Status: DC

## 2014-11-03 MED ORDER — LEVOTHYROXINE SODIUM 75 MCG PO TABS
75.0000 ug | ORAL_TABLET | Freq: Every day | ORAL | Status: DC
  Administered 2014-11-03 – 2014-11-05 (×3): 75 ug via ORAL
  Filled 2014-11-03 (×3): qty 1

## 2014-11-03 MED ORDER — TRAZODONE HCL 50 MG PO TABS
150.0000 mg | ORAL_TABLET | Freq: Every day | ORAL | Status: DC
  Administered 2014-11-03 – 2014-11-04 (×2): 150 mg via ORAL
  Filled 2014-11-03 (×2): qty 1

## 2014-11-03 MED ORDER — LITHIUM CARBONATE ER 450 MG PO TBCR: 450.0000 mg | EXTENDED_RELEASE_TABLET | Freq: Every day | ORAL | Status: DC

## 2014-11-03 MED ORDER — OLANZAPINE 10 MG PO TABS
30.0000 mg | ORAL_TABLET | Freq: Every day | ORAL | Status: DC
  Administered 2014-11-03 – 2014-11-04 (×2): 30 mg via ORAL
  Filled 2014-11-03 (×2): qty 3

## 2014-11-03 MED ORDER — LITHIUM CARBONATE 300 MG PO CAPS
450.0000 mg | ORAL_CAPSULE | Freq: Every day | ORAL | Status: DC
  Filled 2014-11-03: qty 1

## 2014-11-03 MED ORDER — LAMOTRIGINE 25 MG PO TABS
25.0000 mg | ORAL_TABLET | Freq: Four times a day (QID) | ORAL | Status: DC
  Administered 2014-11-03: 25 mg via ORAL
  Filled 2014-11-03: qty 1

## 2014-11-03 MED ORDER — CLONIDINE HCL 0.1 MG PO TABS
0.1000 mg | ORAL_TABLET | Freq: Two times a day (BID) | ORAL | Status: DC
  Administered 2014-11-03 – 2014-11-04 (×4): 0.1 mg via ORAL
  Filled 2014-11-03 (×4): qty 1

## 2014-11-03 NOTE — BHH Suicide Risk Assessment (Signed)
[facility] Admission Suicide Risk Assessment   Nursing information obtained from:    Demographic factors:    Current Mental Status:    Loss Factors:    Historical Factors:    Risk Reduction Factors:    Total Time spent with patient: 1 hour Principal Problem: Bipolar disorder with depression Diagnosis:   Patient Active Problem List   Diagnosis Date Noted  . Bipolar disorder with depression [F31.30] 11/03/2014  . Arthritis [M19.90] 11/03/2014  . 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 History of ADHD [Z86.59] 11/03/2014  . Allergic rhinitis [J30.9] 11/05/2012  . Hypothyroidism [E03.9] 03/28/2012     Continued Clinical Symptoms:  Alcohol Use Disorder Identification Test Final Score (AUDIT): 0 The "Alcohol Use Disorders Identification Test", Guidelines for Use in Primary Care, Second Edition.  World Pharmacologist [facility]). Score between 0-7:  no or low risk or alcohol related problems. Score between 8-15:  moderate risk of alcohol related problems. Score between 16-19:  high risk of alcohol related problems. Score 20 or above:  warrants further diagnostic evaluation for alcohol dependence and treatment.   CLINICAL FACTORS:   Severe Anxiety and/or Agitation Bipolar Disorder:   Depressive phase Previous Psychiatric Diagnoses and Treatments    Psychiatric Specialty Exam: Physical Exam  ROS   COGNITIVE FEATURES THAT CONTRIBUTE TO RISK:  Closed-mindedness    SUICIDE RISK:   Moderate:  Frequent suicidal ideation with limited intensity, and duration, some specificity in terms of plans, no associated intent, good self-control, limited dysphoria/symptomatology, some risk factors present, and identifiable protective factors, including available and accessible social support.  PLAN OF CARE: Admit to behavioral health  Medical Decision Making:  Established Problem, Stable/Improving (1)  I certify that inpatient services furnished can reasonably be expected to improve the  patient's condition.   Hildred Priest 11/03/2014, 12:48 PM

## 2014-11-03 NOTE — Progress Notes (Signed)
Recreation Therapy Notes  Date: 06.07.16 Time: 3:00 pm Location: Craft Room  Group Topic: Self-expression  Goal Area(s) Addresses:  Patient will identify one color per emotion listed on wheel. Patient will verbalize benefit of using art as a means of self-expression. Patient will verbalize one emotion experienced during session. Patient will be educated on other forms of self-expression.  Behavioral Response: Attentive  Intervention: Emotion Wheel  Activity: Patients were given a worksheet with 7 emotions and instructed to pick a color for each emotion.   Education: LRT educated patients on different forms on self-expression.  Education Outcome: Acknowledges education/In group clarification offered  Clinical Observations/Feedback: Patient left group at approximately 3:11 pm because his denture came out. Patient returned to group at approximately 3:22 pm. Patient copied the colors of a peer. Patient did not contribute to group discussion.  Leonette [facility], LRT/CTRS 11/03/2014 4:11 PM

## 2014-11-03 NOTE — ED Notes (Signed)

## 2014-11-03 NOTE — ED Notes (Signed)

## 2014-11-03 NOTE — ED Notes (Signed)
Pt admitted to [facility] per md order

## 2014-11-03 NOTE — Progress Notes (Signed)
Patient has been very anxious at times today, especially when he is trying to convey something, but due to speech impediment, staff doesn't understand immediately. He denies SI. States he would not hurt himself due to his faith in God. Became tearful this evening when he couldn't reach anyone at his group home. He said he misses it. No evidence of psychosis.

## 2014-11-03 NOTE — Progress Notes (Signed)
Patient ID: Daniel Holmes, male   DOB: Oct 13, 1970, 44 y.o.   MRN: [id number]  D:  Pt +ve SI, will not contract for safety. Pt was asked 4x if he would contract and he said he would not. Pt observed sitting in hallway trying to scratch arm and was seen trying to cut himself with his juice cup. Pt +ve for AH- devil is telling him to hurt himself.  Pt denies HI/VH. Pt is pleasant , Pt presents with MR- pt needs continual re-direction and a lot of attention. Pt was placed in the hall due to pt not contracting for safety, pt needs to have constant watch, 1:1.  Pt stated " I have bad nerves, when I hear a lot of noise I can't take it". Pt wears dentures and left    A: Skin was assessed and found to be clear of any abnormal marks. PT searched and no contraband found, POC and unit policies explained and understanding verbalized. Consents obtained. Food and fluids offered, and  accepted.   R:Pt had no additional questions or concerns.

## 2014-11-03 NOTE — ED Notes (Signed)
No change in condition, will continue to monitor.  

## 2014-11-03 NOTE — ED Notes (Signed)
Pt laying in bed, awaiting admission to beh med.

## 2014-11-03 NOTE — BHH Group Notes (Signed)
[facility] Group Notes:  (Nursing/MHT/Case Management/Adjunct)  Date:  11/03/2014  Time:  2:09 PM  Type of Therapy:  Group Therapy  Participation Level:  Active  Participation Quality:  Redirectable  Affect:  Excited  Cognitive:  Lacking  Insight:  Lacking  Engagement in Group:  Supportive  Modes of Intervention:  Support  Summary of Progress/Problems:  Celso Amy 11/03/2014, 2:09 PM

## 2014-11-03 NOTE — ED Notes (Signed)
Pt laying in bed.  

## 2014-11-03 NOTE — Tx Team (Signed)
Initial Interdisciplinary Treatment Plan   PATIENT STRESSORS: Financial difficulties Medication change or noncompliance   PATIENT STRENGTHS: General fund of knowledge Motivation for treatment/growth   PROBLEM LIST: Problem List/Patient Goals Date to be addressed Date deferred Reason deferred Estimated date of resolution  Risk for suicide 11/03/14     housing 11/03/14     Low functioning 11/03/14                                          DISCHARGE CRITERIA:  Adequate post-discharge living arrangements Improved stabilization in mood, thinking, and/or behavior Verbal commitment to aftercare and medication compliance  PRELIMINARY DISCHARGE PLAN: Attend aftercare/continuing care group Outpatient therapy  PATIENT/FAMIILY INVOLVEMENT: This treatment plan has been presented to and reviewed with the patient, Daniel Holmes.  The patient and family have been given the opportunity to ask questions and make suggestions.  [name] 11/03/2014, 6:33 AM

## 2014-11-03 NOTE — H&P (Signed)
Psychiatric Admission Assessment Adult  Patient Identification: Daniel Holmes MRN:  [id number] Date of Evaluation:  11/03/2014 Chief Complaint:  Bipolar Principal Diagnosis: Bipolar disorder with depression Diagnosis:   Patient Active Problem List   Diagnosis Date Noted  . Bipolar disorder with depression [F31.30] 11/03/2014  . Arthritis [M19.90] 11/03/2014  . 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 History of ADHD [Z86.59] 11/03/2014  . Allergic rhinitis [J30.9] 11/05/2012  . Hypothyroidism [E03.9] 03/28/2012   History of Present Illness: Daniel Holmes is a 44 y.o. male with a history of bipolar disorder and intellectual disability. Patient lives at a local group home and is under the guardianship of his father.  Patient was brought in by police on June 6 to our emergency department because he stated he wanted to stab himself with a knife if he couldn't leave his group home. He explained that the reason why he became upset is because he got into argument with a roommate.  Per patient and her roommate was making disrespectful comments about the patient's parents.  He stated he doesn't like the group home he is at because they monitor his behavior and requested to be placed in a different group home.  "I am going to stab myself in the chest; I want a new group home. I don't like it there anymore; If i go back; I'm going to kill myself; the devil is telling me to do it; this group home is not working out for me; I walked away from the two times this week; me and my roommate got into it; I don't like him; I want to get away from there." As he was becoming more and more agitated the patient started reporting having auditory hallucinations telling him to cut himself.  Therefore he is started the scratching his left forearm with his fingernails vigorously.  Patient was transferred from our emergency department to our psychiatric unit. At arrival the patient continued to report desire to cut  himself and was seen in the hallway trying to scratch his wrists with a juice cup.  Patient had to be placed on one-to-one last night.  This morning patient continued to report having auditory hallucinations. He believes is the voice of the devil telling him to cut himself. However he does not have any desire to hurt himself today and is able to contract for safety.. The patient continues to request to be transferred to a new group home however it was explained to him that most likely he was going to return to the same group home. Even after this the patient was calm and cooperative he told me that his father, who is his guardian, also had said that he did not want him to switch group homes at this time.  Patient denied suicidality, homicidality. Denies major issues with appetite, energy, or concentration. He denies any side effects from medications and feels they're working well. He denies having any physical complaints other than chronic leg pain.  Patient was unable to tell me his father's phone number. He does not know the name of the group home. He does not know the name of his outpatient psychiatrist.   Substance abuse history: Patient does not have history of substance abuse. Patient smokes one pack of cigarettes a day.  Elements:  Severity:  Severe. Timing:  Chronic with acute exacerbation. Duration:  The last 48 hours. Context:  Fight with peer and dislike of his new group home. Associated Signs/Symptoms: Depression Symptoms:  depressed mood, (Hypo) Manic  Symptoms:  Impulsivity, Irritable Mood, Anxiety Symptoms:  none Psychotic Symptoms:  Hallucinations: Command:  Voices telling him to cut himself PTSD Symptoms: NA Total Time spent with patient: 1 hour   Past psychiatric history: Patient has a diagnosis of bipolar disorder and intellectual disability. He lives in a group home and is under the guardianship of his father. The patient is followed up at [facility] in [place].   Medications prior to admission lithium 300 twice a day, olanzapine 30 mg daily at bedtime, trazodone 150 mg daily at bedtime, Clonidine 0.1 mg twice a day and Lamictal 25 mg 4 times a day. Per records looks like the patient has been hospitalized at least twice at behavioral health in [place]. However these hospitalizations were several years ago, in the early 2000.  Past Medical History: Suffers from arthritis, GERD, seasonal allergies and hypothyroidism. Past Medical History  Diagnosis Date  . Psychosis     See Dr Daniel Holmes [facility] Daniel Holmes   History reviewed. No pertinent past surgical history.   Family History: History reviewed. No pertinent family history.   Social History:  History  Alcohol Use No     History  Drug Use No    History   Social History  . Marital Status: Single    Spouse Name: N/A  . Number of Children: N/A  . Years of Education: N/A   Social History Main Topics  . Smoking status: Former Smoker -- 1.00 packs/day    Types: Cigarettes  . Smokeless tobacco: Not on file  . Alcohol Use: No  . Drug Use: No  . Sexual Activity: No   Other Topics Concern  . None   Social History Narrative   Lives in group home - [facility] in [place]: Strength & Muscle Tone: within normal limits Gait & Station: normal Patient leans: N/A  Psychiatric Specialty Exam: Physical Exam  Review of Systems  Constitutional: Negative.   HENT: Negative.   Eyes: Negative.   Respiratory: Negative.   Cardiovascular: Negative.   Gastrointestinal: Negative.   Genitourinary: Negative.   Musculoskeletal: Positive for joint pain.  Skin: Negative.   Neurological: Negative.   Endo/Heme/Allergies: Negative.   Psychiatric/Behavioral: Positive for hallucinations.    Blood pressure 116/77, pulse 85, temperature 98.2 F (36.8 C), temperature source Oral, resp. rate 20, height $RemoveBe'5\' 7"'[id number]$  (1.702 m), weight 73.029 kg (161 lb), SpO2 98  %.Body mass index is 25.21 kg/(m^2).  General Appearance: Fairly Groomed  Engineer, water::  Good  Speech:  Difficult to comprehend. The patient has some dysarthria  Volume:  Normal  Mood:  Euthymic  Affect:  Congruent  Thought Process:  concrete  Orientation:  Full (Time, Place, and Person)  Thought Content:  Hallucinations: Command:  Voices telling him to cut himself  Suicidal Thoughts:  No  Homicidal Thoughts:  No  Memory:  Immediate;   Fair Recent;   Fair Remote;   Fair  Judgement:  Poor  Insight:  Shallow  Psychomotor Activity:  Normal  Concentration:  Fair  Recall:  NA  Fund of Knowledge:Poor  Language: Fair  Akathisia:  No  Handed:    AIMS (if indicated):     Assets:  Catering manager Housing Physical Health Social Support  ADL's:  Intact  Cognition: WNL  Sleep:       PHYSICAL EXAM:  Constitutional: Alert and oriented. Well appearing and in no acute distress. Eyes: Conjunctivae are normal. PERRL. EOMI. Head: Atraumatic. Nose: No congestion/rhinnorhea.  Mouth/Throat: Mucous membranes are moist.  Oropharynx non-erythematous. Neck: No stridor.    Cardiovascular: Normal rate, regular rhythm. Grossly normal heart sounds.  Good peripheral circulation. Respiratory: Normal respiratory effort.  No retractions. Lungs CTAB. Gastrointestinal: Soft and nontender. No distention. No abdominal bruits. No CVA tenderness. Musculoskeletal: No lower extremity tenderness nor edema.  No joint effusions. Neurologic:  Normal speech and language. No gross focal neurologic deficits are appreciated. Speech is normal. No gait instability. Skin:  Skin is warm, dry and intact. No rash noted. Psychiatric: Mood and affect are flat. Does speak with the drawl. Slight slurring of his speech, which is states is normal. He is fully alert. He doesn't dorsi feeling suicidal if he has to go back to the same group home. Plan is to stab himself with a kitchen knife.   Risk to Self: Is patient  at risk for suicide?: Yes Risk to Others:   Prior Inpatient Therapy:   Prior Outpatient Therapy:    Alcohol Screening: 1. How often do you have a drink containing alcohol?: Never 9. Have you or someone else been injured as a result of your drinking?: No 10. Has a relative or friend or a doctor or another health worker been concerned about your drinking or suggested you cut down?: No Alcohol Use Disorder Identification Test Final Score (AUDIT): 0 Brief Intervention: AUDIT score less than 7 or less-screening does not suggest unhealthy drinking-brief intervention not indicated  Allergies:   Allergies  Allergen Reactions  . Penicillins Swelling   Lab Results:  Results for orders placed or performed during the hospital encounter of 11/02/14 (from the past 48 hour(s))  CBC     Status: Abnormal   Collection Time: 11/02/14 12:29 PM  Result Value Ref Range   WBC 7.8 3.8 - 10.6 K/uL   RBC 4.27 (L) 4.40 - 5.90 MIL/uL   Hemoglobin 13.6 13.0 - 18.0 g/dL   HCT 40.8 40.0 - 52.0 %   MCV 95.5 80.0 - 100.0 fL   MCH 31.9 26.0 - 34.0 pg   MCHC 33.4 32.0 - 36.0 g/dL   RDW 12.9 11.5 - 14.5 %   Platelets 182 150 - 440 K/uL  Comprehensive metabolic panel     Status: Abnormal   Collection Time: 11/02/14 12:29 PM  Result Value Ref Range   Sodium 142 135 - 145 mmol/L   Potassium 4.1 3.5 - 5.1 mmol/L   Chloride 109 101 - 111 mmol/L   CO2 26 22 - 32 mmol/L   Glucose, Bld 108 (H) 65 - 99 mg/dL   BUN 8 6 - 20 mg/dL   Creatinine, Ser 0.88 0.61 - 1.24 mg/dL   Calcium 10.0 8.9 - 10.3 mg/dL   Total Protein 7.7 6.5 - 8.1 g/dL   Albumin 4.8 3.5 - 5.0 g/dL   AST 34 15 - 41 U/L   ALT 23 17 - 63 U/L   Alkaline Phosphatase 71 38 - 126 U/L   Total Bilirubin 0.4 0.3 - 1.2 mg/dL   GFR calc non Af Amer >60 >60 mL/min   GFR calc Af Amer >60 >60 mL/min    Comment: (NOTE) The eGFR has been calculated using the CKD EPI equation. This calculation has not been validated in all clinical situations. eGFR's  persistently <60 mL/min signify possible Chronic Kidney Disease.    Anion gap 7 5 - 15  Ethanol (ETOH)     Status: None   Collection Time: 11/02/14 12:29 PM  Result Value Ref Range   Alcohol,  Ethyl (B) <5 <5 mg/dL    Comment:        LOWEST DETECTABLE LIMIT FOR SERUM ALCOHOL IS 11 mg/dL FOR MEDICAL PURPOSES ONLY   Acetaminophen level     Status: Abnormal   Collection Time: 11/02/14 12:29 PM  Result Value Ref Range   Acetaminophen (Tylenol), Serum <10 (L) 10 - 30 ug/mL    Comment:        THERAPEUTIC CONCENTRATIONS VARY SIGNIFICANTLY. A RANGE OF 10-30 ug/mL MAY BE AN EFFECTIVE CONCENTRATION FOR MANY PATIENTS. HOWEVER, SOME ARE BEST TREATED AT CONCENTRATIONS OUTSIDE THIS RANGE. ACETAMINOPHEN CONCENTRATIONS >150 ug/mL AT 4 HOURS AFTER INGESTION AND >50 ug/mL AT 12 HOURS AFTER INGESTION ARE OFTEN ASSOCIATED WITH TOXIC REACTIONS.   Salicylate level     Status: None   Collection Time: 11/02/14 12:29 PM  Result Value Ref Range   Salicylate Lvl <3.8 2.8 - 30.0 mg/dL  Urine Drug Screen, Qualitative ([facility] only)     Status: None   Collection Time: 11/02/14 12:29 PM  Result Value Ref Range   Tricyclic, Ur Screen NONE DETECTED NONE DETECTED   Amphetamines, Ur Screen NONE DETECTED NONE DETECTED   MDMA (Ecstasy)Ur Screen NONE DETECTED NONE DETECTED   Cocaine Metabolite,Ur [place] NONE DETECTED NONE DETECTED   Opiate, Ur Screen NONE DETECTED NONE DETECTED   Phencyclidine (PCP) Ur S NONE DETECTED NONE DETECTED   Cannabinoid 50 Ng, Ur  NONE DETECTED NONE DETECTED   Barbiturates, Ur Screen NONE DETECTED NONE DETECTED   Benzodiazepine, Ur Scrn NONE DETECTED NONE DETECTED   Methadone Scn, Ur NONE DETECTED NONE DETECTED    Comment: (NOTE) 182  Tricyclics, urine               Cutoff 1000 ng/mL 200  Amphetamines, urine             Cutoff 1000 ng/mL 300  MDMA (Ecstasy), urine           Cutoff 500 ng/mL 400  Cocaine Metabolite, urine       Cutoff 300 ng/mL 500  Opiate, urine                    Cutoff 300 ng/mL 600  Phencyclidine (PCP), urine      Cutoff 25 ng/mL 700  Cannabinoid, urine              Cutoff 50 ng/mL 800  Barbiturates, urine             Cutoff 200 ng/mL 900  Benzodiazepine, urine           Cutoff 200 ng/mL 1000 Methadone, urine                Cutoff 300 ng/mL 1100 1200 The urine drug screen provides only a preliminary, unconfirmed 1300 analytical test result and should not be used for non-medical 1400 purposes. Clinical consideration and professional judgment should 1500 be applied to any positive drug screen result due to possible 1600 interfering substances. A more specific alternate chemical method 1700 must be used in order to obtain a confirmed analytical result.  1800 Gas chromato graphy / mass spectrometry (GC/MS) is the preferred 1900 confirmatory method.    Current Medications: Current Facility-Administered Medications  Medication Dose Route Frequency Provider Last Rate Last Dose  . acetaminophen (TYLENOL) tablet 650 mg  650 mg Oral Q6H PRN Gonzella Lex, MD   650 mg at 11/03/14 0912  . alum & mag hydroxide-simeth (MAALOX/MYLANTA) 200-200-20 MG/5ML suspension 30 mL  30 mL  Oral Q4H PRN Gonzella Lex, MD      . cloNIDine (CATAPRES) tablet 0.1 mg  0.1 mg Oral BID Gonzella Lex, MD   0.1 mg at 11/03/14 0910  . levothyroxine (SYNTHROID, LEVOTHROID) tablet 75 mcg  75 mcg Oral QAC breakfast Gonzella Lex, MD   75 mcg at 11/03/14 0909  . lithium carbonate (ESKALITH) CR tablet 450 mg  450 mg Oral Q12H Hildred Priest, MD   450 mg at 11/03/14 0942  . loratadine (CLARITIN) tablet 10 mg  10 mg Oral Daily Gonzella Lex, MD   10 mg at 11/03/14 0909  . magnesium hydroxide (MILK OF MAGNESIA) suspension 30 mL  30 mL Oral Daily PRN Gonzella Lex, MD      . OLANZapine (ZYPREXA) tablet 30 mg  30 mg Oral QHS Gonzella Lex, MD      . pantoprazole (PROTONIX) EC tablet 40 mg  40 mg Oral Daily Gonzella Lex, MD   40 mg at 11/03/14 0910  . traZODone (DESYREL)  tablet 150 mg  150 mg Oral QHS Gonzella Lex, MD       PTA Medications: Prescriptions prior to admission  Medication Sig Dispense Refill Last Dose  . acetaminophen (TYLENOL) 500 MG tablet Take 2 tablets (1,000 mg total) by mouth 2 (two) times daily as needed. (Patient taking differently: Take 1,000 mg by mouth 2 (two) times daily as needed for mild pain. ) 60 tablet 11 Past Week at Unknown time  . benzonatate (TESSALON) 100 MG capsule Take 1 capsule (100 mg total) by mouth 2 (two) times daily as needed for cough. 10 capsule 0 11/02/2014 at Unknown time  . cloNIDine (CATAPRES) 0.1 MG tablet Take 0.1 mg by mouth 2 (two) times daily.    11/02/2014 at Unknown time  . lamoTRIgine (LAMICTAL) 25 MG tablet Take 25 mg by mouth 4 (four) times daily.    11/02/2014 at Unknown time  . levothyroxine (SYNTHROID, LEVOTHROID) 75 MCG tablet Take 1 tablet (75 mcg total) by mouth daily. 90 tablet 1 11/02/2014 at Unknown time  . lithium 300 MG tablet Take 150-300 mg by mouth 2 (two) times daily. Patient is tapering off lithium. From 10/30/2014 to 11/05/2014, patient takes 1 tablet in the morning and 1/2 tablet in the evening.  From 11/06/2014 to 11/12/2014, patients takes 1/2 tablet twice a day. From 11/13/2014 to 11/19/2014, patient takes 1/2 tablet in the morning. On 11/20/2014, patient will stop taking.   11/02/2014 at Unknown time  . loratadine (CLARITIN) 10 MG tablet Take 1 tablet (10 mg total) by mouth daily as needed for allergies. 30 tablet 11 11/02/2014 at Unknown time  . Multiple Vitamins-Minerals (MULTIVITAMIN ADULTS 50+) TABS Take 1 tablet by mouth daily.   11/02/2014 at Unknown time  . OLANZapine (ZYPREXA) 20 MG tablet Take 30 mg by mouth at bedtime. Take 1 and 1/2 tablets by mouth at bedtime.   Past Week at Unknown time  . pantoprazole (PROTONIX) 40 MG tablet Take 40 mg by mouth daily.   Past Week at Unknown time  . traZODone (DESYREL) 150 MG tablet Take 150 mg by mouth at bedtime.   Past Week at Unknown time  . Vitamin  D, Ergocalciferol, (DRISDOL) 50000 UNITS CAPS capsule Take 50,000 Units by mouth every 14 (fourteen) days.   Past Week at Unknown time    Previous Psychotropic Medications: Yes   Substance Abuse History in the last 12 months:  No.    Consequences of Substance Abuse:  NA  Results for orders placed or performed during the hospital encounter of 11/02/14 (from the past 72 hour(s))  CBC     Status: Abnormal   Collection Time: 11/02/14 12:29 PM  Result Value Ref Range   WBC 7.8 3.8 - 10.6 K/uL   RBC 4.27 (L) 4.40 - 5.90 MIL/uL   Hemoglobin 13.6 13.0 - 18.0 g/dL   HCT 40.8 40.0 - 52.0 %   MCV 95.5 80.0 - 100.0 fL   MCH 31.9 26.0 - 34.0 pg   MCHC 33.4 32.0 - 36.0 g/dL   RDW 12.9 11.5 - 14.5 %   Platelets 182 150 - 440 K/uL  Comprehensive metabolic panel     Status: Abnormal   Collection Time: 11/02/14 12:29 PM  Result Value Ref Range   Sodium 142 135 - 145 mmol/L   Potassium 4.1 3.5 - 5.1 mmol/L   Chloride 109 101 - 111 mmol/L   CO2 26 22 - 32 mmol/L   Glucose, Bld 108 (H) 65 - 99 mg/dL   BUN 8 6 - 20 mg/dL   Creatinine, Ser 0.88 0.61 - 1.24 mg/dL   Calcium 10.0 8.9 - 10.3 mg/dL   Total Protein 7.7 6.5 - 8.1 g/dL   Albumin 4.8 3.5 - 5.0 g/dL   AST 34 15 - 41 U/L   ALT 23 17 - 63 U/L   Alkaline Phosphatase 71 38 - 126 U/L   Total Bilirubin 0.4 0.3 - 1.2 mg/dL   GFR calc non Af Amer >60 >60 mL/min   GFR calc Af Amer >60 >60 mL/min    Comment: (NOTE) The eGFR has been calculated using the CKD EPI equation. This calculation has not been validated in all clinical situations. eGFR's persistently <60 mL/min signify possible Chronic Kidney Disease.    Anion gap 7 5 - 15  Ethanol (ETOH)     Status: None   Collection Time: 11/02/14 12:29 PM  Result Value Ref Range   Alcohol, Ethyl (B) <5 <5 mg/dL    Comment:        LOWEST DETECTABLE LIMIT FOR SERUM ALCOHOL IS 11 mg/dL FOR MEDICAL PURPOSES ONLY   Acetaminophen level     Status: Abnormal   Collection Time: 11/02/14 12:29 PM   Result Value Ref Range   Acetaminophen (Tylenol), Serum <10 (L) 10 - 30 ug/mL    Comment:        THERAPEUTIC CONCENTRATIONS VARY SIGNIFICANTLY. A RANGE OF 10-30 ug/mL MAY BE AN EFFECTIVE CONCENTRATION FOR MANY PATIENTS. HOWEVER, SOME ARE BEST TREATED AT CONCENTRATIONS OUTSIDE THIS RANGE. ACETAMINOPHEN CONCENTRATIONS >150 ug/mL AT 4 HOURS AFTER INGESTION AND >50 ug/mL AT 12 HOURS AFTER INGESTION ARE OFTEN ASSOCIATED WITH TOXIC REACTIONS.   Salicylate level     Status: None   Collection Time: 11/02/14 12:29 PM  Result Value Ref Range   Salicylate Lvl <2.9 2.8 - 30.0 mg/dL  Urine Drug Screen, Qualitative ([facility] only)     Status: None   Collection Time: 11/02/14 12:29 PM  Result Value Ref Range   Tricyclic, Ur Screen NONE DETECTED NONE DETECTED   Amphetamines, Ur Screen NONE DETECTED NONE DETECTED   MDMA (Ecstasy)Ur Screen NONE DETECTED NONE DETECTED   Cocaine Metabolite,Ur [place] NONE DETECTED NONE DETECTED   Opiate, Ur Screen NONE DETECTED NONE DETECTED   Phencyclidine (PCP) Ur S NONE DETECTED NONE DETECTED   Cannabinoid 50 Ng, Ur [place] NONE DETECTED NONE DETECTED   Barbiturates, Ur Screen NONE DETECTED NONE DETECTED   Benzodiazepine, Ur Scrn NONE DETECTED  NONE DETECTED   Methadone Scn, Ur NONE DETECTED NONE DETECTED    Comment: (NOTE) 269  Tricyclics, urine               Cutoff 1000 ng/mL 200  Amphetamines, urine             Cutoff 1000 ng/mL 300  MDMA (Ecstasy), urine           Cutoff 500 ng/mL 400  Cocaine Metabolite, urine       Cutoff 300 ng/mL 500  Opiate, urine                   Cutoff 300 ng/mL 600  Phencyclidine (PCP), urine      Cutoff 25 ng/mL 700  Cannabinoid, urine              Cutoff 50 ng/mL 800  Barbiturates, urine             Cutoff 200 ng/mL 900  Benzodiazepine, urine           Cutoff 200 ng/mL 1000 Methadone, urine                Cutoff 300 ng/mL 1100 1200 The urine drug screen provides only a preliminary, unconfirmed 1300 analytical test result and should  not be used for non-medical 1400 purposes. Clinical consideration and professional judgment should 1500 be applied to any positive drug screen result due to possible 1600 interfering substances. A more specific alternate chemical method 1700 must be used in order to obtain a confirmed analytical result.  1800 Gas chromato graphy / mass spectrometry (GC/MS) is the preferred 1900 confirmatory method.     Psychological Evaluations: No   Treatment Plan Summary: Daily contact with patient to assess and evaluate symptoms and progress in treatment and Medication management   Bipolar disorder: I will continue the patient on olanzapine 30 mg by mouth daily at bedtime. In addition I will increase the lithium to 450 mg by mouth twice a day as his level at arrival was 0.4. I will discontinue Lamictal 25 mg 4 times a day as this medication is unlikely to help with agitation and impulsivity.  History of ADHD: Continue clonidine 0.1 mg by mouth twice a day  For insomnia and continue trazodone 150 mg by mouth daily at bedtime  For GERD the patient will be continued on Protonix 40 mg by mouth daily  For seasonal allergies he will be continued on Claritin 10 mg by mouth daily  For hypothyroidism he will be continued on Synthroid 75 g daily. I will check his TSH today.  Tobacco use disorder:We'll start the patient on nicotine patch 21 mg daily  Precautions: Today I will discontinue one-to-one precautions. As patient is telling me that the despite hearing voices he is not planning on hurting himself and he has not desire to hurt himself. Patient will be placed on every 15 minute checks  Hospitalization and status: He will be continued on involuntary commitment  Discharge disposition: Once a stable plan to discharge back to his group home.  Labs: I will order TSH today. Lithium level will be rechecked in 5 days as dose has been increased.  Medical Decision Making:  Established Problem,  Stable/Improving (1)  I certify that inpatient services furnished can reasonably be expected to improve the patient's condition.   Hildred Priest 6/7/201612:34 PM

## 2014-11-03 NOTE — [facility] Group Notes (Signed)
[place] Group Notes:  (Nursing/MHT/Case Management/Adjunct)  Date:  11/03/2014  Time:  11:09 PM  Type of Therapy:  Group Therapy  Participation Level:  Minimal  Participation Quality:  Redirectable  Affect:  Excited  Cognitive:  Disorganized  Insight:  Lacking  Engagement in Group:  Off Topic  Modes of Intervention:  Discussion  Summary of Progress/Problems:  Daniel Holmes Emera Bussie 11/03/2014, 11:09 PM

## 2014-11-03 NOTE — Progress Notes (Signed)
Recreation Therapy Notes  At approximately 1:20 pm, LRT spoke with patient's nurse regarding assessment. Per nursing, patient is not talking much and is focused on getting clothes from his group home. LRT will attempt assessment tomorrow.  Leonette Monarch, LRT/CTRS 11/03/2014 1:28 PM

## 2014-11-04 DIAGNOSIS — F800 Phonological disorder: Secondary | ICD-10-CM | POA: Diagnosis present

## 2014-11-04 LAB — T4, FREE: FREE T4: 0.84 ng/dL (ref 0.61–1.12)

## 2014-11-04 LAB — TSH: TSH: 2.472 u[IU]/mL (ref 0.350–4.500)

## 2014-11-04 MED ORDER — WHITE PETROLATUM GEL
Status: DC | PRN
  Filled 2014-11-04: qty 5

## 2014-11-04 MED ORDER — LITHIUM CARBONATE ER 450 MG PO TBCR: 450.0000 mg | EXTENDED_RELEASE_TABLET | Freq: Two times a day (BID) | ORAL | Status: DC

## 2014-11-04 NOTE — Progress Notes (Signed)
[facility] LCSW Aftercare Discharge Planning Group Note  11/04/2014 10:27 AM  Participation Quality:  Appropriate  Affect:  Appropriate  Cognitive:  Appropriate  Insight:  Improving  Engagement in Group:  Developing/Improving  Modes of Intervention:  Discussion, Education and Support  Summary of Progress/Problems: patients goal for therapy come to group and be active. He reports on a scale for 1-10 that he is at 5 for depression. Pt was encouraged to continue with groups.  [name] 11/04/2014, 10:27 AM

## 2014-11-04 NOTE — Progress Notes (Signed)
[facility] MD Progress Note  11/04/2014 11:38 AM Daniel Holmes  MRN:  [id number] Subjective:  Patient reports feeling much better today. He described his mood as "good". He denies having auditory or visual hallucinations "they are gone". He denies thoughts of self-harm or having SI or HI. Patient denies side effects from medications. He denies having any physical complaints. He denies major problems with his sleep, appetite, energy, or concentration.  Principal Problem: Bipolar disorder with depression Diagnosis:   Patient Active Problem List   Diagnosis Date Noted  . Bipolar disorder with depression [F31.30] 11/03/2014  . Arthritis [M19.90] 11/03/2014  . 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 History of ADHD [Z86.59] 11/03/2014  . Allergic rhinitis [J30.9] 11/05/2012  . Hypothyroidism [E03.9] 03/28/2012   Total Time spent with patient: 30 minutes   Past Medical History:  Past Medical History  Diagnosis Date  . Psychosis     See Dr Laverta [name]   History reviewed. No pertinent past surgical history. Family History: History reviewed. No pertinent family history. Social History:  History  Alcohol Use No     History  Drug Use No    History   Social History  . Marital Status: Single    Spouse Name: N/A  . Number of Children: N/A  . Years of Education: N/A   Social History Main Topics  . Smoking status: Former Smoker -- 1.00 packs/day    Types: Cigarettes  . Smokeless tobacco: Not on file  . Alcohol Use: No  . Drug Use: No  . Sexual Activity: No   Other Topics Concern  . None   Social History Narrative   Lives in group home - [facility] in [place]   Additional History:    Sleep: Good  Appetite:  Good   Assessment:   Musculoskeletal: Strength & Muscle Tone: within normal limits Gait & Station: Patient limps Patient leans: N/A   Psychiatric Specialty Exam: Physical Exam  Review of Systems   Constitutional: Negative.   HENT: Negative.   Eyes: Negative.   Respiratory: Negative.   Cardiovascular: Negative.   Gastrointestinal: Negative.   Genitourinary: Negative.   Musculoskeletal: Negative.   Skin: Negative.   Neurological: Negative.   Endo/Heme/Allergies: Negative.   Psychiatric/Behavioral: Negative.     Blood pressure 102/62, pulse 82, temperature 98.1 F (36.7 C), temperature source Oral, resp. rate 20, height 5' 7"  (1.702 m), weight 73.029 kg (161 lb), SpO2 98 %.Body mass index is 25.21 kg/(m^2).  General Appearance: Well Groomed  Engineer, water::  Good  Speech:  Normal Rate  Volume:  Normal  Mood:  Euthymic  Affect:  Congruent  Thought Process:  concrete  Orientation:  Full (Time, Place, and Person)  Thought Content:  Hallucinations: None  Suicidal Thoughts:  No  Homicidal Thoughts:  No  Memory:  Immediate;   Fair Recent;   Fair Remote;   Fair  Judgement:  Fair  Insight:  Shallow  Psychomotor Activity:  Normal  Concentration:  Fair  Recall:  NA  Fund of Knowledge:Poor  Language: Fair  Akathisia:  No  Handed:    AIMS (if indicated):     Assets:  Catering manager Housing Physical Health Social Support  ADL's:  Intact  Cognition: WNL  Sleep:  Number of Hours: 6.75     Current Medications: Current Facility-Administered Medications  Medication Dose Route Frequency Provider Last Rate Last Dose  . acetaminophen (TYLENOL) tablet 650 mg  650 mg Oral Q6H PRN Jenny Reichmann  T Clapacs, MD   650 mg at 11/03/14 1755  . alum & mag hydroxide-simeth (MAALOX/MYLANTA) 200-200-20 MG/5ML suspension 30 mL  30 mL Oral Q4H PRN Gonzella Lex, MD      . cloNIDine (CATAPRES) tablet 0.1 mg  0.1 mg Oral BID Gonzella Lex, MD   0.1 mg at 11/04/14 0904  . levothyroxine (SYNTHROID, LEVOTHROID) tablet 75 mcg  75 mcg Oral QAC breakfast Gonzella Lex, MD   75 mcg at 11/04/14 0901  . lithium carbonate (ESKALITH) CR tablet 450 mg  450 mg Oral Q12H Hildred Priest, MD    450 mg at 11/04/14 0904  . loratadine (CLARITIN) tablet 10 mg  10 mg Oral Daily Gonzella Lex, MD   10 mg at 11/04/14 0904  . magnesium hydroxide (MILK OF MAGNESIA) suspension 30 mL  30 mL Oral Daily PRN Gonzella Lex, MD      . nicotine (NICODERM CQ - dosed in mg/24 hours) patch 14 mg  14 mg Transdermal Daily Hildred Priest, MD   14 mg at 11/03/14 1756  . OLANZapine (ZYPREXA) tablet 30 mg  30 mg Oral QHS Gonzella Lex, MD   30 mg at 11/03/14 2045  . pantoprazole (PROTONIX) EC tablet 40 mg  40 mg Oral Daily Gonzella Lex, MD   40 mg at 11/04/14 0904  . traZODone (DESYREL) tablet 150 mg  150 mg Oral QHS Gonzella Lex, MD   150 mg at 11/03/14 2044    Lab Results:  Results for orders placed or performed during the hospital encounter of 11/02/14 (from the past 48 hour(s))  CBC     Status: Abnormal   Collection Time: 11/02/14 12:29 PM  Result Value Ref Range   WBC 7.8 3.8 - 10.6 K/uL   RBC 4.27 (L) 4.40 - 5.90 MIL/uL   Hemoglobin 13.6 13.0 - 18.0 g/dL   HCT 40.8 40.0 - 52.0 %   MCV 95.5 80.0 - 100.0 fL   MCH 31.9 26.0 - 34.0 pg   MCHC 33.4 32.0 - 36.0 g/dL   RDW 12.9 11.5 - 14.5 %   Platelets 182 150 - 440 K/uL  Comprehensive metabolic panel     Status: Abnormal   Collection Time: 11/02/14 12:29 PM  Result Value Ref Range   Sodium 142 135 - 145 mmol/L   Potassium 4.1 3.5 - 5.1 mmol/L   Chloride 109 101 - 111 mmol/L   CO2 26 22 - 32 mmol/L   Glucose, Bld 108 (H) 65 - 99 mg/dL   BUN 8 6 - 20 mg/dL   Creatinine, Ser 0.88 0.61 - 1.24 mg/dL   Calcium 10.0 8.9 - 10.3 mg/dL   Total Protein 7.7 6.5 - 8.1 g/dL   Albumin 4.8 3.5 - 5.0 g/dL   AST 34 15 - 41 U/L   ALT 23 17 - 63 U/L   Alkaline Phosphatase 71 38 - 126 U/L   Total Bilirubin 0.4 0.3 - 1.2 mg/dL   GFR calc non Af Amer >60 >60 mL/min   GFR calc Af Amer >60 >60 mL/min    Comment: (NOTE) The eGFR has been calculated using the CKD EPI equation. This calculation has not been validated in all clinical  situations. eGFR's persistently <60 mL/min signify possible Chronic Kidney Disease.    Anion gap 7 5 - 15  Ethanol (ETOH)     Status: None   Collection Time: 11/02/14 12:29 PM  Result Value Ref Range   Alcohol, Ethyl (B) <5 <  5 mg/dL    Comment:        LOWEST DETECTABLE LIMIT FOR SERUM ALCOHOL IS 11 mg/dL FOR MEDICAL PURPOSES ONLY   Acetaminophen level     Status: Abnormal   Collection Time: 11/02/14 12:29 PM  Result Value Ref Range   Acetaminophen (Tylenol), Serum <10 (L) 10 - 30 ug/mL    Comment:        THERAPEUTIC CONCENTRATIONS VARY SIGNIFICANTLY. A RANGE OF 10-30 ug/mL MAY BE AN EFFECTIVE CONCENTRATION FOR MANY PATIENTS. HOWEVER, SOME ARE BEST TREATED AT CONCENTRATIONS OUTSIDE THIS RANGE. ACETAMINOPHEN CONCENTRATIONS >150 ug/mL AT 4 HOURS AFTER INGESTION AND >50 ug/mL AT 12 HOURS AFTER INGESTION ARE OFTEN ASSOCIATED WITH TOXIC REACTIONS.   Salicylate level     Status: None   Collection Time: 11/02/14 12:29 PM  Result Value Ref Range   Salicylate Lvl <6.6 2.8 - 30.0 mg/dL  Urine Drug Screen, Qualitative ([facility] only)     Status: None   Collection Time: 11/02/14 12:29 PM  Result Value Ref Range   Tricyclic, Ur Screen NONE DETECTED NONE DETECTED   Amphetamines, Ur Screen NONE DETECTED NONE DETECTED   MDMA (Ecstasy)Ur Screen NONE DETECTED NONE DETECTED   Cocaine Metabolite,Ur [place] NONE DETECTED NONE DETECTED   Opiate, Ur Screen NONE DETECTED NONE DETECTED   Phencyclidine (PCP) Ur S NONE DETECTED NONE DETECTED   Cannabinoid 50 Ng, Ur  NONE DETECTED NONE DETECTED   Barbiturates, Ur Screen NONE DETECTED NONE DETECTED   Benzodiazepine, Ur Scrn NONE DETECTED NONE DETECTED   Methadone Scn, Ur NONE DETECTED NONE DETECTED    Comment: (NOTE) 294  Tricyclics, urine               Cutoff 1000 ng/mL 200  Amphetamines, urine             Cutoff 1000 ng/mL 300  MDMA (Ecstasy), urine           Cutoff 500 ng/mL 400  Cocaine Metabolite, urine       Cutoff 300 ng/mL 500  Opiate,  urine                   Cutoff 300 ng/mL 600  Phencyclidine (PCP), urine      Cutoff 25 ng/mL 700  Cannabinoid, urine              Cutoff 50 ng/mL 800  Barbiturates, urine             Cutoff 200 ng/mL 900  Benzodiazepine, urine           Cutoff 200 ng/mL 1000 Methadone, urine                Cutoff 300 ng/mL 1100 1200 The urine drug screen provides only a preliminary, unconfirmed 1300 analytical test result and should not be used for non-medical 1400 purposes. Clinical consideration and professional judgment should 1500 be applied to any positive drug screen result due to possible 1600 interfering substances. A more specific alternate chemical method 1700 must be used in order to obtain a confirmed analytical result.  1800 Gas chromato graphy / mass spectrometry (GC/MS) is the preferred 1900 confirmatory method.     Physical Findings: AIMS: Facial and Oral Movements Muscles of Facial Expression: None, normal Lips and Perioral Area: None, normal Jaw: None, normal Tongue: None, normal,Extremity Movements Upper (arms, wrists, hands, fingers): None, normal Lower (legs, knees, ankles, toes): None, normal, Trunk Movements Neck, shoulders, hips: None, normal, Overall Severity Severity of abnormal movements (highest score from  questions above): None, normal Incapacitation due to abnormal movements: None, normal Patient's awareness of abnormal movements (rate only patient's report): No Awareness, Dental Status Current problems with teeth and/or dentures?: Yes Does patient usually wear dentures?: Yes  CIWA:  CIWA-Ar Total: 0 COWS:  COWS Total Score: 0  Treatment Plan Summary: Daily contact with patient to assess and evaluate symptoms and progress in treatment and Medication management  Bipolar disorder: I will continue the patient on olanzapine 30 mg by mouth daily at bedtime. In addition dose of lithium was increase to lithium to 450 mg by mouth twice a day as his level at arrival was  0.4. I will discontinue Lamictal 25 mg 4 times a day as this medication is unlikely to help with agitation and impulsivity. Lithium level will be checked tomorrow a.m..  History of ADHD: Continue clonidine 0.1 mg by mouth twice a day  For insomnia and continue trazodone 150 mg by mouth daily at bedtime  For GERD the patient will be continued on Protonix 40 mg by mouth daily  For seasonal allergies he will be continued on Claritin 10 mg by mouth daily  For hypothyroidism he will be continued on Synthroid 75 g daily. I will check his TSH  and free T4 today.  Tobacco use disorder:We'll start the patient on nicotine patch 21 mg daily  Precautions: One-to-one precautions were discontinued on June 7. Continue every 15 minute checks patient has abstained from self-harm   Hospitalization and status: He will be continued on involuntary commitment  Discharge disposition: Once a stable plan to discharge back to his group home. Possible discharge in the next 2 days.  Labs: I will order TSH today. Lithium level will be rechecked tomorrow.     Medical Decision Making:  Established Problem, Stable/Improving (1)     Hildred Priest 11/04/2014, 11:38 AM

## 2014-11-04 NOTE — Progress Notes (Addendum)
D: Patient affect and mood are anxious.  Patient endorses auditory hallucinations and can be seen interacting with them.  However, once patient redirected he is immediately able to stop hearing the voices.  Patient responds well to positive reinforcement and praise for not listening to the voices and not hurting himself.  Patient stated, "I like you so I won't hurt myself.  I won't do anything."   Patient did attend evening group. Patient visible on the milieu. A: Support and encouragement offered. Scheduled medications given to pt. Q 15 min checks continued for patient safety. R: Patient receptive. Patient remains safe on the unit.

## 2014-11-04 NOTE — Progress Notes (Signed)
[facility] LCSW Group Therapy  11/04/2014 2:51 PM  Type of Therapy:  Group Therapy  Participation Level:  Active  Participation Quality:  Intrusive  Affect:  Irritable  Cognitive:  Appropriate  Insight:  Distracting  Engagement in Therapy:  Engaged  Modes of Intervention:  Discussion, Education and Support  Summary of Progress/Problems:Todays group topic was Finding Balance in Life. Patient was able to relate what a good day looks like when he is balanced and what steps to take to ensure he can maintain balance in life when he discharges from hospital. Good support to peers, however..... He interupted speaker 6-10 times but was easily redirected.    [name] 11/04/2014, 2:51 PM

## 2014-11-04 NOTE — Progress Notes (Signed)
Patient is denying SI and A/VH. States he is planning to be discharged tomorrow. He called his group home this afternoon and cried tears of joy when he was told he was missed there. He is intrusive at times but redirectable. No acute distress.

## 2014-11-04 NOTE — [facility] Group Notes (Signed)
[facility] Group Notes:  (Nursing/MHT/Case Management/Adjunct)  Date:  11/04/2014  Time:  11:50 AM  Type of Therapy:  Group Therapy  Participation Level:  Active  Participation Quality:  Appropriate  Affect:  Appropriate  Cognitive:  Appropriate  Insight:  Improving  Engagement in Group:  Engaged  Modes of Intervention:  Activity  Summary of Progress/Problems:  Daniel Holmes 11/04/2014, 11:50 AM

## 2014-11-04 NOTE — Progress Notes (Signed)
[facility] LCSW Group Therapy  11/04/2014 7:33 AM  Type of Therapy:  Group Therapy  Participation Level:  Active  Participation Quality:  Attentive  Affect:  Appropriate  Cognitive:  Appropriate  Insight:  Engaged  Engagement in Therapy:  Engaged  Modes of Intervention:  Discussion, Education, Exploration and Support  Summary of Progress/Problems:Group therapy today's discussion was emotional regulation. Patients were asked to identify negative emotions and ways to cope with those emotions. This patient was able to share personal experiences and support peers in discussion.   [name] 11/04/2014, 7:33 AM

## 2014-11-04 NOTE — Progress Notes (Signed)
Recreation Therapy Notes  Date: 06.08.16 Time: 3:00 pm Location: Craft Room  Group Topic: Self-esteem  Goal Area(s) Addresses:  Patient will write down at least one positive trait about self. Patient will verbalize how it felt to see positive traits on paper.  Behavioral Response: Attentive, Disruptive  Intervention: I Am  Activity: Patients were given a worksheet with the letter I on it and instructed to list as many positive things inside the letter as they can.  Education: LRT educated patient on ways to increase self-esteem   Education Outcome: Acknowledges education/In group clarification offered  Clinical Observations/Feedback: Patient did not participate in group activity. LRT had to redirect patient from talking. Patient complied. Patient stated it felt good and positive to see positive traits on paper even though he did not participate in group.  Leonette Monarch, LRT/CTRS 11/04/2014 4:27 PM

## 2014-11-04 NOTE — Progress Notes (Signed)
   11/04/14 2000  Clinical Encounter Type  Visited With Patient  Visit Type Follow-up  Referral From Nurse  Consult/Referral To Chaplain  Spiritual Encounters  Spiritual Needs Other (Comment) (follow up)  Stress Factors  Patient Stress Factors Family relationships  Family Stress Factors Financial concerns;Family relationships  Advance Directives (For Healthcare)  Does patient have an advance directive? No  Would patient like information on creating an advanced directive? No - patient declined information   Chaplain provided therapeutic presence and empathic listening.   AD [phone]

## 2014-11-04 NOTE — Plan of Care (Signed)
Problem: Alteration in mood; excessive anxiety as evidenced by: Goal: STG-Pt will report an absence of self-harm thoughts/actions (Patient will report an absence of self-harm thoughts or actions)  Outcome: Progressing Patient states he is improving in terms of having AH and thoughts of hurting self.

## 2014-11-04 NOTE — Tx Team (Signed)
Interdisciplinary Treatment Plan Update (Adult)  Date:  11/04/2014 Time Reviewed:  10:05 AM  Progress in Treatment: Attending groups: Yes. Participating in groups:  Yes. Taking medication as prescribed:  Yes. Tolerating medication:  Yes. Family/Significant othe contact made:  No, will contact:   when permitted Patient understands diagnosis:  No. Discussing patient identified problems/goals with staff:  Yes. Medical problems stabilized or resolved:  Yes. Denies suicidal/homicidal ideation: Yes. Issues/concerns per patient self-inventory:  No. Other:  New problem(s) identified: No, Describe:     Discharge Plan or Barriers:  Reason for Continuation of Hospitalization: Suicidal ideation Other; describe self-harm behaviors  Comments:is a 44 y.o. male with a history of bipolar disorder and intellectual disability. Patient lives at a local group home and is under the guardianship of his father. Patient was brought in by police on June 6 to our emergency department because he stated he wanted to stab himself with a knife if he couldn't leave his group home. He explained that the reason why he became upset is because he got into argument with a roommate. Per patient and her roommate was making disrespectful comments about the patient's parents. He stated he doesn't like the group home he is at because they monitor his behavior and requested to be placed in a different group home. "I am going to stab myself in the chest; I want a new group home. I don't like it there anymore; If i go back; I'm going to kill myself; the devil is telling me to do it; this group home is not working out for me; I walked away from the two times this week; me and my roommate got into it; I don't like him; I want to get away from there." As he was becoming more and more agitated the patient started reporting having auditory hallucinations telling him to cut himself. Therefore he is started the scratching his left forearm  with his fingernails vigorously.   Estimated length of stay:up to 3 days  New goal(s):None  Review of initial/current patient goals per problem list:  SEE PLAN OF CARE  Attendees: Patient:  Daniel Holmes 6/8/201610:05 AM  Family:   6/8/201610:05 AM  Physician:  Merlyn Albert, MD 6/8/201610:05 AM  Nursing:   Abigail Butts RN 6/8/201610:05 AM  Case Manager:   6/8/201610:05 AM  Counselor:  Dossie Arbour, LCSW 6/8/201610:05 AM  Other:  Carmell Austria, LCSW 6/8/201610:05 AM  Other:   6/8/201610:05 AM  Other:   6/8/201610:05 AM  Other:  6/8/201610:05 AM  Other:  6/8/201610:05 AM  Other:  6/8/201610:05 AM  Other:  6/8/201610:05 AM  Other:  6/8/201610:05 AM  Other:  6/8/201610:05 AM  Other:   6/8/201610:05 AM   Scribe for Treatment Team:   Dossie Arbour P,LCSW 11/04/2014, 10:05 AM

## 2014-11-05 LAB — LITHIUM LEVEL: Lithium Lvl: 0.5 mmol/L — ABNORMAL LOW (ref 0.60–1.20)

## 2014-11-05 NOTE — Progress Notes (Signed)
  [facility] Adult Case Management Discharge Plan :  Will you be returning to the same living situation after discharge:  Yes,    At discharge, do you have transportation home?: Yes,    Do you have the ability to pay for your medications: Yes,     Release of information consent forms completed and in the chart;  Patient's signature needed at discharge.  Patient to Follow up at: Follow-up Information    Follow up with [facility]. Go on 11/09/2014.   Why:  1:40pm for , Hospital Follow up, Outpatient Medication Management   Contact information:   [address] [place], [place] [address] [phone]; [phone]      Patient denies SI/HI: Yes,       Safety Planning and Suicide Prevention discussed: Yes,     Have you used any form of tobacco in the last 30 days? (Cigarettes, Smokeless Tobacco, Cigars, and/or Pipes): No  Has patient been referred to the Quitline?: N/A patient is not a smoker   Lurlean Nanny, Carloyn Jaeger, LCSW 11/05/2014, 1:16 PM

## 2014-11-05 NOTE — Progress Notes (Signed)
Patient discharged to group home with staff member. Instructions reviewed with [facility] staff person. He verbalized understanding of meds and follow up appointment and said he would make sure group home manager received the information. Patient denied SI/HI/AVH. Belongings returned.

## 2014-11-05 NOTE — Discharge Summary (Signed)
Physician Discharge Summary Note  Patient:  Daniel Holmes is an 44 y.o., male MRN:  [id number] DOB:  06/12/1970 Patient phone:  [phone] (home)  Patient address:   [place] [address],  Total Time spent with patient: 30 minutes  Date of Admission:  11/03/2014 Date of Discharge: 11/05/2014  Reason for Admission:  Self-injurious behavior and suicidal ideation  Principal Problem: Bipolar disorder with depression Discharge Diagnoses: Patient Active Problem List   Diagnosis Date Noted  . 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 Bipolar disorder with depression [F31.30] 11/03/2014  . Arthritis [M19.90] 11/03/2014  . 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 History of ADHD [Z86.59] 11/03/2014  . Allergic rhinitis [J30.9] 11/05/2012  . Hypothyroidism [E03.9] 03/28/2012    Musculoskeletal: Strength & Muscle Tone: within normal limits Gait & Station: Patient limps Patient leans: N/A  Psychiatric Specialty Exam: Physical Exam  Review of Systems  Constitutional: Negative.   HENT: Negative.   Eyes: Negative.   Respiratory: Negative.   Cardiovascular: Negative.   Gastrointestinal: Negative.   Genitourinary: Negative.   Musculoskeletal: Negative.   Skin: Negative.   Neurological: Negative.   Endo/Heme/Allergies: Negative.   Psychiatric/Behavioral: Negative.   All other systems reviewed and are negative.   Blood pressure 95/66, pulse 60, temperature 98.3 F (36.8 C), temperature source Oral, resp. rate 20, height $RemoveBe'5\' 7"'FHHizGUEs$  (1.702 m), weight 73.029 kg (161 lb), SpO2 98 %.Body mass index is 25.21 kg/(m^2).  General Appearance: Well Groomed  Engineer, water::  Good  Speech:  Normal Rate  Volume:  Normal  Mood:  Euthymic  Affect:  Congruent  Thought Process:  Concrete  Orientation:  Full (Time, Place, and Person)  Thought Content:  Hallucinations: None  Suicidal Thoughts:  No  Homicidal Thoughts:  No  Memory:  Immediate;   Fair Recent;    Fair Remote;   Fair  Judgement:  Fair  Insight:  Shallow  Psychomotor Activity:  Normal  Concentration:  Fair  Recall:  NA  Fund of Knowledge:Poor  Language: Fair  Akathisia:  No  Handed:    AIMS (if indicated):     Assets:  Public house manager Social Support Transportation  ADL's:  Intact  Cognition: WNL  Sleep:  Number of Hours: 5.3   Have you used any form of tobacco in the last 30 days? (Cigarettes, Smokeless Tobacco, Cigars, and/or Pipes): No  Has this patient used any form of tobacco in the last 30 days? (Cigarettes, Smokeless Tobacco, Cigars, and/or Pipes) Yes, A prescription for an FDA-approved tobacco cessation medication was offered at discharge and the patient refused    History of Present Illness: Daniel Holmes is a 44 y.o. male with a history of bipolar disorder and intellectual disability. Patient lives at a local group home and is under the guardianship of his father. Patient was brought in by police on June 6 to our emergency department because he stated he wanted to stab himself with a knife if he couldn't leave his group home. He explained that the reason why he became upset is because he got into argument with a roommate. Per patient and her roommate was making disrespectful comments about the patient's parents. He stated he doesn't like the group home he is at because they monitor his behavior and requested to be placed in a different group home. "I am going to stab myself in the chest; I want a new group home. I don't like it there anymore; If i go back; I'm  going to kill myself; the devil is telling me to do it; this group home is not working out for me; I walked away from the two times this week; me and my roommate got into it; I don't like him; I want to get away from there." As he was becoming more and more agitated the patient started reporting having auditory hallucinations telling him to cut himself. Therefore he is  started the scratching his left forearm with his fingernails vigorously.  Patient was transferred from our emergency department to our psychiatric unit. At arrival the patient continued to report desire to cut himself and was seen in the hallway trying to scratch his wrists with a juice cup. Patient had to be placed on one-to-one last night. This morning patient continued to report having auditory hallucinations. He believes is the voice of the devil telling him to cut himself. However he does not have any desire to hurt himself today and is able to contract for safety.. The patient continues to request to be transferred to a new group home however it was explained to him that most likely he was going to return to the same group home. Even after this the patient was calm and cooperative he told me that his father, who is his guardian, also had said that he did not want him to switch group homes at this time. Patient denied suicidality, homicidality. Denies major issues with appetite, energy, or concentration. He denies any side effects from medications and feels they're working well. He denies having any physical complaints other than chronic leg pain.  Patient was unable to tell me his father's phone number. He does not know the name of the group home. He does not know the name of his outpatient psychiatrist.   Substance abuse history: Patient does not have history of substance abuse. Patient smokes one pack of cigarettes a day.  Elements: Severity: Severe. Timing: Chronic with acute exacerbation. Duration: The last 48 hours. Context: Fight with peer and dislike of his new group home. Associated Signs/Symptoms: Depression Symptoms: depressed mood, (Hypo) Manic Symptoms: Impulsivity, Irritable Mood, Anxiety Symptoms: none Psychotic Symptoms: Hallucinations: Command: Voices telling him to cut himself PTSD Symptoms: NA Total Time spent with patient: 1 hour   Past psychiatric history:  Patient has a diagnosis of bipolar disorder and intellectual disability. He lives in a group home and is under the guardianship of his father. The patient is followed up at [facility] [place]. Medications prior to admission lithium 300 twice a day, olanzapine 30 mg daily at bedtime, trazodone 150 mg daily at bedtime, Clonidine 0.1 mg twice a day and Lamictal 25 mg 4 times a day. Per records looks like the patient has been hospitalized at least twice at behavioral health in [place]. However these hospitalizations were several years ago, in the early 2000.  Past Medical History: Suffers from arthritis, GERD, seasonal allergies and hypothyroidism. Past Medical History  Diagnosis Date  . Psychosis     See Dr Laverta [place] [name]   History reviewed. No pertinent past surgical history.   Family History: History reviewed. No pertinent family history.   Social History:  History  Alcohol Use No    History  Drug Use No    History   Social History  . Marital Status: Single    Spouse Name: N/A  . Number of Children: N/A  . Years of Education: N/A   Social History Main Topics  . Smoking status: Former Smoker --  1.00 packs/day    Types: Cigarettes  . Smokeless tobacco: Not on file  . Alcohol Use: No  . Drug Use: No  . Sexual Activity: No   Other Topics Concern  . None   Social History Narrative   Lives in group home - [facility] Course:   Bipolar disorder: I will continue the patient on olanzapine 30 mg by mouth daily at bedtime. In addition dose of lithium was increase to lithium to 450 mg by mouth twice a day as his level at arrival was 0.4. I will discontinue Lamictal 25 mg 4 times a day as this medication is unlikely to help with agitation and impulsivity. Lithium level will be checked tomorrow a.m.  His lithium level on 6/9 was  0.5   Patient tolerated the higher dose of lithium well. He did not develop any side effects.  History of ADHD: Continue clonidine 0.1 mg by mouth twice a day  For insomnia and continue trazodone 150 mg by mouth daily at bedtime  For GERD the patient will be continued on Protonix 40 mg by mouth daily  For seasonal allergies he will be continued on Claritin 10 mg by mouth daily  For hypothyroidism he will be continued on Synthroid 75 g daily. TSH and T4 were within the normal limits.  Tobacco use disorder:We'll start the patient on nicotine patch 21 mg daily  On the first day of his hospitalization the patient display attention seeking behaviors. He grabbed a juice bottle and was attempting to cut himself in front of the staff. Nurses reported that the patient was asking to have a one-to-one staff with him. For less than 24 hours the patient require one-to-one precautions in order to prevent self injury. Once patient was able to contract for safety this was discontinued and he was placed on every 15 minute checks. Patient has not had any self injury in the last 48 hours. On the day of the discharge he denied suicidality, homicidality or having auditory or visual hallucinations. He described his mood as euthymic. He was cheerful, friendly, calm, pleasant and cooperative.  Patient participated actively in programming. There was no need for seclusion, restraints or forced medications.   Consults:  None  Significant Diagnostic Studies:  None  Discharge Vitals:   Blood pressure 95/66, pulse 60, temperature 98.3 F (36.8 C), temperature source Oral, resp. rate 20, height _0  (1.702 m), weight 73.029 kg (161 lb), SpO2 98 %. Body mass index is 25.21 kg/(m^2).   Lab Results:    Results for JAMELL, LAYMON (MRN [id number]) as of 11/05/2014 10:28  Ref. Range 11/02/2014 12:29 11/04/2014 09:33 11/05/2014 07:27  Sodium Latest Ref Range: 135-145 mmol/L 142    Potassium Latest Ref Range: 3.5-5.1 mmol/L 4.1     Chloride Latest Ref Range: 101-111 mmol/L 109    CO2 Latest Ref Range: 22-32 mmol/L 26    BUN Latest Ref Range: 6-20 mg/dL 8    Creatinine Latest Ref Range: 0.61-1.24 mg/dL 0.88    Calcium Latest Ref Range: 8.9-10.3 mg/dL 10.0    EGFR (Non-African Amer.) Latest Ref Range: >60 mL/min >60    EGFR (African American) Latest Ref Range: >60 mL/min >60    Glucose Latest Ref Range: 65-99 mg/dL 108 (H)    Anion gap Latest Ref Range: 5-15  7    Alkaline Phosphatase Latest Ref Range: 38-126 U/L 71    Albumin Latest Ref Range:  3.5-5.0 g/dL 4.8    AST Latest Ref Range: 15-41 U/L 34    ALT Latest Ref Range: 17-63 U/L 23    Total Protein Latest Ref Range: 6.5-8.1 g/dL 7.7    Total Bilirubin Latest Ref Range: 0.3-1.2 mg/dL 0.4    WBC Latest Ref Range: 3.8-10.6 K/uL 7.8    RBC Latest Ref Range: 4.40-5.90 MIL/uL 4.27 (L)    Hemoglobin Latest Ref Range: 13.0-18.0 g/dL 13.6    HCT Latest Ref Range: 40.0-52.0 % 40.8    MCV Latest Ref Range: 80.0-100.0 fL 95.5    MCH Latest Ref Range: 26.0-34.0 pg 31.9    MCHC Latest Ref Range: 32.0-36.0 g/dL 33.4    RDW Latest Ref Range: 11.5-14.5 % 12.9    Platelets Latest Ref Range: 150-440 K/uL 182    Lithium Lvl Latest Ref Range: 0.60-1.20 mmol/L   1.54 (L)  Salicylate Lvl Latest Ref Range: 2.8-30.0 mg/dL <4.0    Acetaminophen (Tylenol), S Latest Ref Range: 10-30 ug/mL <10 (L)    TSH Latest Ref Range: 0.350-4.500 uIU/mL  2.472   Free T4 Latest Ref Range: 0.61-1.12 ng/dL  0.84   Alcohol, Ethyl (B) Latest Ref Range: <5 mg/dL <5    Amphetamines, Ur Screen Latest Ref Range: NONE DETECTED  NONE DETECTED    Barbiturates, Ur Screen Latest Ref Range: NONE DETECTED  NONE DETECTED    Benzodiazepine, Ur Scrn Latest Ref Range: NONE DETECTED  NONE DETECTED    Cocaine Metabolite,Ur [place] Latest Ref Range: NONE DETECTED  NONE DETECTED    Methadone Scn, Ur Latest Ref Range: NONE DETECTED  NONE DETECTED    MDMA (Ecstasy)Ur Screen Latest Ref Range: NONE DETECTED  NONE DETECTED     Cannabinoid 50 Ng, Ur [place] Latest Ref Range: NONE DETECTED  NONE DETECTED    Opiate, Ur Screen Latest Ref Range: NONE DETECTED  NONE DETECTED    Phencyclidine (PCP) Ur S Latest Ref Range: NONE DETECTED  NONE DETECTED    Tricyclic, Ur Screen Latest Ref Range: NONE DETECTED  NONE DETECTED      Physical Findings: AIMS: Facial and Oral Movements Muscles of Facial Expression: None, normal Lips and Perioral Area: None, normal Jaw: None, normal Tongue: None, normal,Extremity Movements Upper (arms, wrists, hands, fingers): None, normal Lower (legs, knees, ankles, toes): None, normal, Trunk Movements Neck, shoulders, hips: None, normal, Overall Severity Severity of abnormal movements (highest score from questions above): None, normal Incapacitation due to abnormal movements: None, normal Patient's awareness of abnormal movements (rate only patient's report): No Awareness, Dental Status Current problems with teeth and/or dentures?: Yes Does patient usually wear dentures?: Yes  CIWA:  CIWA-Ar Total: 0 COWS:  COWS Total Score: 0   See Psychiatric Specialty Exam and Suicide Risk Assessment completed by Attending Physician prior to discharge.  Discharge destination:  Other:  Group home  Is patient on multiple antipsychotic therapies at discharge:  No   Has Patient had three or more failed trials of antipsychotic monotherapy by history:  No    Recommended Plan for Multiple Antipsychotic Therapies: NA  Discharge Instructions    Diet general    Complete by:  As directed             Medication List    STOP taking these medications        acetaminophen 500 MG tablet  Commonly known as:  TYLENOL     benzonatate 100 MG capsule  Commonly known as:  TESSALON     lamoTRIgine 25 MG tablet  Commonly  known as:  LAMICTAL     lithium 300 MG tablet  Replaced by:  lithium carbonate 450 MG CR tablet      TAKE these medications      Indication   cloNIDine 0.1 MG tablet  Commonly known  as:  CATAPRES  Take 0.1 mg by mouth 2 (two) times daily.  Notes to Patient:  History of ADHD      levothyroxine 75 MCG tablet  Commonly known as:  SYNTHROID, LEVOTHROID  Take 1 tablet (75 mcg total) by mouth daily.  Notes to Patient:  Hypothyroidism      lithium carbonate 450 MG CR tablet  Commonly known as:  ESKALITH  Take 1 tablet (450 mg total) by mouth every 12 (twelve) hours.  Notes to Patient:  Mood instability      loratadine 10 MG tablet  Commonly known as:  CLARITIN  Take 1 tablet (10 mg total) by mouth daily as needed for allergies.  Notes to Patient:  Seasonal allergies      MULTIVITAMIN ADULTS 50+ Tabs  Take 1 tablet by mouth daily.  Notes to Patient:  Nutritional supplement      OLANZapine 20 MG tablet  Commonly known as:  ZYPREXA  Take 30 mg by mouth at bedtime. Take 1 and 1/2 tablets by mouth at bedtime.  Notes to Patient:  Mood instability      pantoprazole 40 MG tablet  Commonly known as:  PROTONIX  Take 40 mg by mouth daily.  Notes to Patient:  GERD      traZODone 150 MG tablet  Commonly known as:  DESYREL  Take 150 mg by mouth at bedtime.  Notes to Patient:  Insomnia      Vitamin D (Ergocalciferol) 50000 UNITS Caps capsule  Commonly known as:  DRISDOL  Take 50,000 Units by mouth every 14 (fourteen) days.  Notes to Patient:  Vitamin D deficiency            Follow-up Information    Follow up with [facility]. Go on 11/09/2014.   Why:  1:40pm for , Hospital Follow up, Outpatient Medication Management   Contact information:   [address] [place],  [address] [phone]; [phone]      Total Discharge Time: 30 m  Signed: Hildred Priest 11/05/2014, 10:27 AM

## 2014-11-05 NOTE — BHH Group Notes (Signed)
[facility] LCSW Group Therapy  11/05/2014 12:59 PM  Type of Therapy:  Group Therapy  Participation Level:  Active  Participation Quality:  Attentive  Affect:  Appropriate  Cognitive:  Appropriate  Insight:  Developing/Improving  Engagement in Therapy:  Engaged  Modes of Intervention:  Discussion, Education, Exploration and Support  Summary of Progress/Problems:Group Therapy  was centered on suicide prevention and intervention and resources in the community several handouts provided to patient. Patient was able to share and follow discussion and reflected on personal experiences. Pt was able to be supportive of peers.  [name] 11/05/2014, 12:59 PM

## 2014-11-05 NOTE — BHH Suicide Risk Assessment (Signed)
[facility] Discharge Suicide Risk Assessment   Demographic Factors:  Male  Total Time spent with patient: 30 minutes  Musculoskeletal: Strength & Muscle Tone: within normal limits Gait & Station: limps Patient leans: N/A  Psychiatric Specialty Exam: Physical Exam  ROS                                                         Have you used any form of tobacco in the last 30 days? (Cigarettes, Smokeless Tobacco, Cigars, and/or Pipes): No  Has this patient used any form of tobacco in the last 30 days? (Cigarettes, Smokeless Tobacco, Cigars, and/or Pipes) Yes, A prescription for an FDA-approved tobacco cessation medication was offered at discharge and the patient refused  Mental Status Per Nursing Assessment::   On Admission:     Current Mental Status by Physician: denies SI, HI or A/VH  Loss Factors: NA  Historical Factors: Impulsivity  Risk Reduction Factors:   Sense of responsibility to family, Living with another person, especially a relative and Positive social support  Continued Clinical Symptoms:  More than one psychiatric diagnosis Previous Psychiatric Diagnoses and Treatments  Cognitive Features That Contribute To Risk:  Closed-mindedness    Suicide Risk:  Minimal: No identifiable suicidal ideation.  Patients presenting with no risk factors but with morbid ruminations; may be classified as minimal risk based on the severity of the depressive symptoms  Principal Problem: Bipolar disorder with depression Discharge Diagnoses:  Patient Active Problem List   Diagnosis Date Noted  . 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 Bipolar disorder with depression [F31.30] 11/03/2014  . Arthritis [M19.90] 11/03/2014  . 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 History of ADHD [Z86.59] 11/03/2014  . Allergic rhinitis [J30.9] 11/05/2012  . Hypothyroidism [E03.9] 03/28/2012      Plan Of Care/Follow-up recommendations:  Other:  f/u  with outpt provider  Is patient on multiple antipsychotic therapies at discharge:  No   Has Patient had three or more failed trials of antipsychotic monotherapy by history:  No  Recommended Plan for Multiple Antipsychotic Therapies: NA    Hildred Priest 11/05/2014, 8:50 AM

## 2014-11-05 NOTE — Progress Notes (Signed)
AVS H&P Discharge Summary faxed to [facility] for hospital follow-up

## 2014-11-05 NOTE — Progress Notes (Signed)
D: Patient denies SI/HI/AVH. PPatient affect and mood are anxious.  Patient interaction is intrusive.  Patient did attend evening group. Patient visible on the milieu. No distress noted. A: Support and encouragement offered. Scheduled medications given to pt. Q 15 min checks continued for patient safety. R: Patient receptive. Patient remains safe on the unit.

## 2014-11-05 NOTE — BHH Suicide Risk Assessment (Signed)
[facility] INPATIENT:  Family/Significant Other Suicide Prevention Education  Suicide Prevention Education:  Education Completed; Burnard Leigh, Group Home staff,  (name of family member/significant other) has been identified by the patient as the family member/significant other with whom the patient will be residing, and identified as the person(s) who will aid the patient in the event of a mental health crisis (suicidal ideations/suicide attempt).  With written consent from the patient, the family member/significant other has been provided the following suicide prevention education, prior to the and/or following the discharge of the patient.  The suicide prevention education provided includes the following:  Suicide risk factors  Suicide prevention and interventions  National Suicide Hotline telephone number  [facility] assessment telephone number  [facility] Emergency Assistance [place] and/or Residential Mobile Crisis Unit telephone number  Request made of family/significant other to:  Remove weapons (e.g., guns, rifles, knives), all items previously/currently identified as safety concern.    Remove drugs/medications (over-the-counter, prescriptions, illicit drugs), all items previously/currently identified as a safety concern.  The family member/significant other verbalizes understanding of the suicide prevention education information provided.  The family member/significant other agrees to remove the items of safety concern listed above.  August Saucer, LCSW 11/05/2014, 9:49 AM

## 2014-11-07 ENCOUNTER — Emergency Department
Admission: EM | Admit: 2014-11-07 | Discharge: 2014-11-08 | Disposition: A | Discharge status: Other Acute Inpatient Hospital | Payer: Medicaid Other | Attending: Emergency Medicine | Admitting: Emergency Medicine

## 2014-11-07 DIAGNOSIS — Y92009 Unspecified place in unspecified non-institutional (private) residence as the place of occurrence of the external cause: Secondary | ICD-10-CM | POA: Diagnosis not present

## 2014-11-07 DIAGNOSIS — S50311A Abrasion of right elbow, initial encounter: Secondary | ICD-10-CM | POA: Diagnosis not present

## 2014-11-07 DIAGNOSIS — X838XXA Intentional self-harm by other specified means, initial encounter: Secondary | ICD-10-CM | POA: Insufficient documentation

## 2014-11-07 DIAGNOSIS — Y998 Other external cause status: Secondary | ICD-10-CM | POA: Diagnosis not present

## 2014-11-07 DIAGNOSIS — Z87891 Personal history of nicotine dependence: Secondary | ICD-10-CM | POA: Insufficient documentation

## 2014-11-07 DIAGNOSIS — Y9389 Activity, other specified: Secondary | ICD-10-CM | POA: Insufficient documentation

## 2014-11-07 DIAGNOSIS — S50312A Abrasion of left elbow, initial encounter: Secondary | ICD-10-CM | POA: Insufficient documentation

## 2014-11-07 DIAGNOSIS — Z880 Allergy status to penicillin: Secondary | ICD-10-CM | POA: Diagnosis not present

## 2014-11-07 DIAGNOSIS — R45851 Suicidal ideations: Secondary | ICD-10-CM

## 2014-11-07 DIAGNOSIS — T1491 Suicide attempt: Secondary | ICD-10-CM | POA: Diagnosis present

## 2014-11-07 LAB — CBC
HCT: 37 % — ABNORMAL LOW (ref 40.0–52.0)
Hemoglobin: 12.4 g/dL — ABNORMAL LOW (ref 13.0–18.0)
MCH: 31.5 pg (ref 26.0–34.0)
MCHC: 33.6 g/dL (ref 32.0–36.0)
MCV: 93.8 fL (ref 80.0–100.0)
PLATELETS: 161 10*3/uL (ref 150–440)
RBC: 3.94 MIL/uL — ABNORMAL LOW (ref 4.40–5.90)
RDW: 12.5 % (ref 11.5–14.5)
WBC: 4.6 10*3/uL (ref 3.8–10.6)

## 2014-11-07 LAB — COMPREHENSIVE METABOLIC PANEL
ALT: 21 U/L (ref 17–63)
ANION GAP: 6 (ref 5–15)
AST: 25 U/L (ref 15–41)
Albumin: 4.3 g/dL (ref 3.5–5.0)
Alkaline Phosphatase: 68 U/L (ref 38–126)
BUN: 11 mg/dL (ref 6–20)
CO2: 23 mmol/L (ref 22–32)
Calcium: 8.9 mg/dL (ref 8.9–10.3)
Chloride: 108 mmol/L (ref 101–111)
Creatinine, Ser: 0.82 mg/dL (ref 0.61–1.24)
GFR calc Af Amer: >60 mL/min (ref >60)
Glucose, Bld: 99 mg/dL (ref 65–99)
Potassium: 3.8 mmol/L (ref 3.5–5.1)
SODIUM: 137 mmol/L (ref 135–145)
Total Bilirubin: 0.3 mg/dL (ref 0.3–1.2)
Total Protein: 6.8 g/dL (ref 6.5–8.1)

## 2014-11-07 LAB — URINALYSIS COMPLETE WITH MICROSCOPIC (ARMC ONLY)
Bacteria, UA: NONE SEEN
Bilirubin Urine: NEGATIVE
GLUCOSE, UA: NEGATIVE mg/dL
HGB URINE DIPSTICK: NEGATIVE
Ketones, ur: NEGATIVE mg/dL
Leukocytes, UA: NEGATIVE
Nitrite: NEGATIVE
PH: 7 (ref 5.0–8.0)
Protein, ur: NEGATIVE mg/dL
RBC / HPF: NONE SEEN RBC/hpf (ref 0–5)
Specific Gravity, Urine: 1.002 — ABNORMAL LOW (ref 1.005–1.030)

## 2014-11-07 LAB — ACETAMINOPHEN LEVEL: Acetaminophen (Tylenol), Serum: <10 ug/mL — ABNORMAL LOW (ref 10–30)

## 2014-11-07 LAB — URINE DRUG SCREEN, QUALITATIVE (ARMC ONLY)
AMPHETAMINES, UR SCREEN: NOT DETECTED
Barbiturates, Ur Screen: NOT DETECTED
Benzodiazepine, Ur Scrn: NOT DETECTED
CANNABINOID 50 NG, UR ~~LOC~~: NOT DETECTED
Cocaine Metabolite,Ur ~~LOC~~: NOT DETECTED
MDMA (ECSTASY) UR SCREEN: NOT DETECTED
METHADONE SCREEN, URINE: NOT DETECTED
Opiate, Ur Screen: NOT DETECTED
Phencyclidine (PCP) Ur S: NOT DETECTED
TRICYCLIC, UR SCREEN: NOT DETECTED

## 2014-11-07 LAB — SALICYLATE LEVEL

## 2014-11-07 LAB — ETHANOL: Alcohol, Ethyl (B): <5 mg/dL (ref <5)

## 2014-11-07 MED ORDER — ALUM & MAG HYDROXIDE-SIMETH 200-200-20 MG/5ML PO SUSP: 30.0000 mL | ORAL | Status: DC | PRN

## 2014-11-07 MED ORDER — ACETAMINOPHEN 325 MG PO TABS
650.0000 mg | ORAL_TABLET | Freq: Four times a day (QID) | ORAL | Status: DC | PRN
  Administered 2014-11-07 – 2014-11-08 (×2): 650 mg via ORAL

## 2014-11-07 MED ORDER — OLANZAPINE 10 MG PO TABS
ORAL_TABLET | ORAL | Status: AC
  Administered 2014-11-07: 10 mg
  Filled 2014-11-07: qty 1

## 2014-11-07 MED ORDER — MAGNESIUM HYDROXIDE 400 MG/5ML PO SUSP: 30.0000 mL | Freq: Every day | ORAL | Status: DC | PRN

## 2014-11-07 MED ORDER — OLANZAPINE 10 MG PO TABS
10.0000 mg | ORAL_TABLET | Freq: Every day | ORAL | Status: DC
  Administered 2014-11-07: 10 mg via ORAL

## 2014-11-07 NOTE — ED Notes (Signed)

## 2014-11-07 NOTE — ED Provider Notes (Signed)
-----------------------------------------   4:49 PM on 11/07/2014 -----------------------------------------  Patient has been seen and evaluated by psychiatry, they will be admitting the patient for further workup and treatment.  Harvest Dark, MD 11/07/14 [phone]

## 2014-11-07 NOTE — ED Notes (Signed)
ED BHU [place] Is the patient under IVC or is there intent for IVC: No. Is the patient medically cleared: No Is there vacancy in the ED BHU: Yes.   Is the population mix appropriate for patient: Yes.   Is the patient awaiting placement in inpatient or outpatient setting: Yes.   Has the patient had a psychiatric consult: No. Survey of unit performed for contraband, proper placement and condition of furniture, tampering with fixtures in bathroom, shower, and each patient room: Yes.  ; Findings:  APPEARANCE/BEHAVIOR calm, cooperative and adequate rapport can be established NEURO ASSESSMENT Orientation: time, place and person Hallucinations: No.None noted (Hallucinations) Speech: Slurred   Gait: normal RESPIRATORY ASSESSMENT Normal expansion.  Clear to auscultation.  No rales, rhonchi, or wheezing. CARDIOVASCULAR ASSESSMENT regular rate and rhythm, S1, S2 normal, no murmur, click, rub or gallop GASTROINTESTINAL ASSESSMENT soft, nontender, BS WNL, no r/g EXTREMITIES normal strength, tone, and muscle mass PLAN OF CARE Provide calm/safe environment. Vital signs assessed twice daily. ED BHU Assessment once each 12-hour shift. Collaborate with intake RN daily or as condition indicates. Assure the ED provider has rounded once each shift. Provide and encourage hygiene. Provide redirection as needed. Assess for escalating behavior; address immediately and inform ED provider.  Assess family dynamic and appropriateness for visitation as needed: Yes.  ; If necessary, describe findings:  Educate the patient/family about BHU procedures/visitation: Yes.  ; If necessary, describe findings:

## 2014-11-07 NOTE — ED Notes (Signed)
Pt moved to [facility] room 2   Snack and drink provided   Pt observed with no unusual behavior  Appropriate to stimulation  No verbalized needs or concerns at this time  NAD assessed  Continue to monitor

## 2014-11-07 NOTE — ED Notes (Signed)
BEHAVIORAL HEALTH ROUNDING Patient sleeping: No. Patient alert and oriented: yes Behavior appropriate: Yes.  ; If no, describe:  Nutrition and fluids offered: Yes  Toileting and hygiene offered: Yes  Sitter present: not applicable Law enforcement present: Yes  

## 2014-11-07 NOTE — BHH Counselor (Signed)
Pt. is to be admitted to [facility] [place] by Dr. Franchot Mimes. Attending Physician will be Dr. Bary Leriche.  Pt. has been assigned to room 307, by [place].  Intake Paper Work has been signed and placed on pt. Charrt  [facility] Amy T, RN) have been made aware of the admission.   Received phone call from [place] Noreene Larsson) about pt. Needing to be transferred on tomorrow morning (11/08/2014) due to staffing issues. She stated, she called housing supervisor, updated her and she is aware. Writer updated ER Staff (Amy T., RN) and Camera operator (Arrie Aran ,RN)

## 2014-11-07 NOTE — ED Notes (Signed)
Pt brought in by group home faculty for attempted suicide. Pt found trying to hang himself with belt this AM. Abrasions to both inner elbows from patient scratching. Pt denies HI.

## 2014-11-07 NOTE — ED Notes (Signed)
BEHAVIORAL HEALTH ROUNDING Patient sleeping: No. Patient alert and oriented: yes Behavior appropriate: Yes.  ; If no, describe:  Nutrition and fluids offered: yes Toileting and hygiene offered: Yes  Sitter present: q15 minute observations and security camera monitoring Law enforcement present: Yes  ODS  

## 2014-11-07 NOTE — ED Notes (Signed)
Pt is to be admitted to [facility] BMU  They had a call out and they are unable to accept him tonight due to low staffing  Pt will hold here until staff is available tomorrow Pt observed with no unusual behavior  Appropriate to stimulation  No verbalized needs or concerns at this time  NAD assessed  Continue to monitor

## 2014-11-07 NOTE — ED Notes (Signed)
RN returned to pt's room and asked pt how he was feeling.  Pt didn't mention the voices.  Pt said he was

## 2014-11-07 NOTE — ED Notes (Signed)
Pt transferred into ED BHU room 7    Patient assigned to appropriate care area. Patient oriented to unit/care area: Informed that, for their safety, care areas are designed for safety and monitored by security cameras at all times; Visiting hours and phone times explained to patient. Patient verbalizes understanding, and verbal contract for safety obtained.    Pt to be admitted to [facility] BMU

## 2014-11-07 NOTE — ED Notes (Signed)

## 2014-11-07 NOTE — ED Notes (Signed)
Patient observed lying in bed with eyes closed  Even, unlabored respirations observed   NAD pt appears to be sleeping  I will continue to monitor along with every 15 minute visual observations and ongoing security camera monitoring    

## 2014-11-07 NOTE — ED Notes (Signed)
RN reported to MD that the pt continued to hear voices.  See MAR.

## 2014-11-07 NOTE — ED Notes (Signed)
Shah will be amitted down steps this p.m

## 2014-11-07 NOTE — Consult Note (Signed)
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 Diagnosis:   Patient Active Problem List   Diagnosis Date Noted  . 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 Bipolar disorder with depression [F31.30] 11/03/2014  . Arthritis [M19.90] 11/03/2014  . 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 History of ADHD [Z86.59] 11/03/2014  . Allergic rhinitis [J30.9] 11/05/2012  . Hypothyroidism [E03.9] 03/28/2012    Total Time spent with patient: 45 minutes  Subjective:   Daniel Holmes is a 44 y.o. male patient admitted with " suicidal ideas and wanting to hurt myself with a belt around throat and cutting self with a broken glass.".  HPI:   Pt has a long H/O MI and lives ina  [place] where he had conflicts with another clint and started heaving voices telling him to hurt himself." HPI Elements:     Past Medical History:  Past Medical History  Diagnosis Date  . Psychosis     See Dr Laverta [place] [name]   History reviewed. No pertinent past surgical history. Family History: No family history on file. Social History:  History  Alcohol Use No     History  Drug Use No    History   Social History  . Marital Status: Single    Spouse Name: N/A  . Number of Children: N/A  . Years of Education: N/A   Social History Main Topics  . Smoking status: Former Smoker -- 1.00 packs/day    Types: Cigarettes  . Smokeless tobacco: Not on file  . Alcohol Use: No  . Drug Use: No  . Sexual Activity: No   Other Topics Concern  . None   Social History Narrative   Lives in group home - [facility] in [place]   Additional Social History:    Pain Medications: None Reported Prescriptions: None Reported Over the Counter: None Reported History of alcohol / drug  use?: No history of alcohol / drug abuse Longest period of sobriety (when/how long): None Reported Negative Consequences of Use:  (None Reported) Withdrawal Symptoms:  (None Reported)                     Allergies:   Allergies  Allergen Reactions  . Penicillins Swelling    Labs:  Results for orders placed or performed during the hospital encounter of 11/07/14 (from the past 48 hour(s))  Acetaminophen level     Status: Abnormal   Collection Time: 11/07/14 12:21 PM  Result Value Ref Range   Acetaminophen (Tylenol), Serum <10 (L) 10 - 30 ug/mL    Comment:        THERAPEUTIC CONCENTRATIONS VARY SIGNIFICANTLY. A RANGE OF 10-30 ug/mL MAY BE AN EFFECTIVE CONCENTRATION FOR MANY PATIENTS. HOWEVER, SOME ARE BEST TREATED AT CONCENTRATIONS OUTSIDE THIS RANGE. ACETAMINOPHEN CONCENTRATIONS >150 ug/mL AT 4 HOURS AFTER INGESTION AND >50 ug/mL AT 12 HOURS AFTER INGESTION ARE OFTEN ASSOCIATED WITH TOXIC REACTIONS.   CBC     Status: Abnormal   Collection Time: 11/07/14 12:21 PM  Result Value Ref Range   WBC 4.6 3.8 - 10.6 K/uL   RBC 3.94 (L) 4.40 - 5.90 MIL/uL   Hemoglobin 12.4 (L) 13.0 - 18.0 g/dL   HCT 37.0 (L) 40.0 - 52.0 %   MCV 93.8 80.0 - 100.0 fL   MCH 31.5 26.0 -  34.0 pg   MCHC 33.6 32.0 - 36.0 g/dL   RDW 12.5 11.5 - 14.5 %   Platelets 161 150 - 440 K/uL  Comprehensive metabolic panel     Status: None   Collection Time: 11/07/14 12:21 PM  Result Value Ref Range   Sodium 137 135 - 145 mmol/L   Potassium 3.8 3.5 - 5.1 mmol/L   Chloride 108 101 - 111 mmol/L   CO2 23 22 - 32 mmol/L   Glucose, Bld 99 65 - 99 mg/dL   BUN 11 6 - 20 mg/dL   Creatinine, Ser 0.82 0.61 - 1.24 mg/dL   Calcium 8.9 8.9 - 10.3 mg/dL   Total Protein 6.8 6.5 - 8.1 g/dL   Albumin 4.3 3.5 - 5.0 g/dL   AST 25 15 - 41 U/L   ALT 21 17 - 63 U/L   Alkaline Phosphatase 68 38 - 126 U/L   Total Bilirubin 0.3 0.3 - 1.2 mg/dL   GFR calc non Af Amer >60 >60 mL/min   GFR calc Af Amer >60 >60 mL/min     Comment: (NOTE) The eGFR has been calculated using the CKD EPI equation. This calculation has not been validated in all clinical situations. eGFR's persistently <60 mL/min signify possible Chronic Kidney Disease.    Anion gap 6 5 - 15  Ethanol (ETOH)     Status: None   Collection Time: 11/07/14 12:21 PM  Result Value Ref Range   Alcohol, Ethyl (B) <5 <5 mg/dL    Comment:        LOWEST DETECTABLE LIMIT FOR SERUM ALCOHOL IS 5 mg/dL FOR MEDICAL PURPOSES ONLY   Salicylate level     Status: None   Collection Time: 11/07/14 12:21 PM  Result Value Ref Range   Salicylate Lvl <9.2 2.8 - 30.0 mg/dL  Urine Drug Screen, Qualitative ([facility] only)     Status: None   Collection Time: 11/07/14 12:21 PM  Result Value Ref Range   Tricyclic, Ur Screen NONE DETECTED NONE DETECTED   Amphetamines, Ur Screen NONE DETECTED NONE DETECTED   MDMA (Ecstasy)Ur Screen NONE DETECTED NONE DETECTED   Cocaine Metabolite,Ur [place] NONE DETECTED NONE DETECTED   Opiate, Ur Screen NONE DETECTED NONE DETECTED   Phencyclidine (PCP) Ur S NONE DETECTED NONE DETECTED   Cannabinoid 50 Ng, Ur [place] NONE DETECTED NONE DETECTED   Barbiturates, Ur Screen NONE DETECTED NONE DETECTED   Benzodiazepine, Ur Scrn NONE DETECTED NONE DETECTED   Methadone Scn, Ur NONE DETECTED NONE DETECTED    Comment: (NOTE) 426  Tricyclics, urine               Cutoff 1000 ng/mL 200  Amphetamines, urine             Cutoff 1000 ng/mL 300  MDMA (Ecstasy), urine           Cutoff 500 ng/mL 400  Cocaine Metabolite, urine       Cutoff 300 ng/mL 500  Opiate, urine                   Cutoff 300 ng/mL 600  Phencyclidine (PCP), urine      Cutoff 25 ng/mL 700  Cannabinoid, urine              Cutoff 50 ng/mL 800  Barbiturates, urine             Cutoff 200 ng/mL 900  Benzodiazepine, urine           Cutoff 200  ng/mL 1000 Methadone, urine                Cutoff 300 ng/mL 1100 1200 The urine drug screen provides only a preliminary, unconfirmed 1300 analytical test  result and should not be used for non-medical 1400 purposes. Clinical consideration and professional judgment should 1500 be applied to any positive drug screen result due to possible 1600 interfering substances. A more specific alternate chemical method 1700 must be used in order to obtain a confirmed analytical result.  1800 Gas chromato graphy / mass spectrometry (GC/MS) is the preferred 1900 confirmatory method.   Urinalysis complete, with microscopic ([facility] only)     Status: Abnormal   Collection Time: 11/07/14 12:21 PM  Result Value Ref Range   Color, Urine COLORLESS (A) YELLOW   APPearance CLEAR (A) CLEAR   Glucose, UA NEGATIVE NEGATIVE mg/dL   Bilirubin Urine NEGATIVE NEGATIVE   Ketones, ur NEGATIVE NEGATIVE mg/dL   Specific Gravity, Urine 1.002 (L) 1.005 - 1.030   Hgb urine dipstick NEGATIVE NEGATIVE   pH 7.0 5.0 - 8.0   Protein, ur NEGATIVE NEGATIVE mg/dL   Nitrite NEGATIVE NEGATIVE   Leukocytes, UA NEGATIVE NEGATIVE   RBC / HPF NONE SEEN 0 - 5 RBC/hpf   WBC, UA 0-5 0 - 5 WBC/hpf   Bacteria, UA NONE SEEN NONE SEEN   Squamous Epithelial / LPF 0-5 (A) NONE SEEN    Vitals: Blood pressure 201/83, pulse 79, temperature 98 F (36.7 C), temperature source Oral, resp. rate 16, height 5' 6"  (1.676 m), weight 72.576 kg (160 lb), SpO2 100 %.  Risk to Self: Suicidal Ideation: Yes-Currently Present Suicidal Intent: No Is patient at risk for suicide?: Yes Suicidal Plan?: Yes-Currently Present (Put a belt around his neck) Specify Current Suicidal Plan: "Put a belt around my neck." Access to Means: No What has been your use of drugs/alcohol within the last 12 months?: None Reported How many times?: 1 Other Self Harm Risks: Previous admission, he attempted to cut his wrist with a toilet tissue roll. Triggers for Past Attempts: Other (Comment) (When he doesn't get his way and when stressed.) Intentional Self Injurious Behavior: Bruising Comment - Self Injurious Behavior: Put a belt  around his neck. Risk to Others: Homicidal Ideation: No Thoughts of Harm to Others: No Current Homicidal Intent: No Current Homicidal Plan: No Access to Homicidal Means: No Identified Victim: None Reported History of harm to others?: No Assessment of Violence: None Noted Violent Behavior Description: None Reported Does patient have access to weapons?: No Criminal Charges Pending?: No Does patient have a court date: No Prior Inpatient Therapy: Prior Inpatient Therapy: Yes Prior Therapy Dates: 10/2014 Prior Therapy Facilty/Provider(s): [facility] Reason for Treatment: Depression and Suicidal Gestures Prior Outpatient Therapy: Prior Outpatient Therapy: No Prior Therapy Dates: n/a Prior Therapy Facilty/Provider(s): n/a Reason for Treatment: bipolar Does patient have an ACCT team?: No Does patient have Intensive In-House Services?  : No Does patient have [facility] services? : No Does patient have P4CC services?: No  Current Facility-Administered Medications  Medication Dose Route Frequency Provider Last Rate Last Dose  . OLANZapine (ZYPREXA) 10 MG tablet           . OLANZapine (ZYPREXA) tablet 10 mg  10 mg Oral QHS Harvest Dark, MD       Current Outpatient Prescriptions  Medication Sig Dispense Refill  . cloNIDine (CATAPRES) 0.1 MG tablet Take 0.1 mg by mouth 2 (two) times daily.     Marland Kitchen levothyroxine (SYNTHROID, LEVOTHROID) 75  MCG tablet Take 1 tablet (75 mcg total) by mouth daily. 90 tablet 1  . lithium carbonate (ESKALITH) 450 MG CR tablet Take 1 tablet (450 mg total) by mouth every 12 (twelve) hours. 60 tablet 0  . loratadine (CLARITIN) 10 MG tablet Take 1 tablet (10 mg total) by mouth daily as needed for allergies. 30 tablet 11  . Multiple Vitamins-Minerals (MULTIVITAMIN ADULTS 50+) TABS Take 1 tablet by mouth daily.    Marland Kitchen OLANZapine (ZYPREXA) 20 MG tablet Take 30 mg by mouth at bedtime. Take 1 and 1/2 tablets by mouth at bedtime.    . pantoprazole (PROTONIX) 40 MG tablet Take 40  mg by mouth daily.    . traZODone (DESYREL) 150 MG tablet Take 150 mg by mouth at bedtime.    . Vitamin D, Ergocalciferol, (DRISDOL) 50000 UNITS CAPS capsule Take 50,000 Units by mouth every 14 (fourteen) days.      Musculoskeletal: Strength & Muscle Tone: within normal limits Gait & Station: normal Patient leans: N/A  Psychiatric Specialty Exam: Physical Exam  Review of Systems  Constitutional: Negative.   HENT: Negative.   Eyes: Negative.   Respiratory: Negative.   Cardiovascular: Negative.   Gastrointestinal: Negative.   Genitourinary: Negative.   Musculoskeletal: Negative.   Skin: Positive for rash.  Neurological: Negative.   Endo/Heme/Allergies: Negative.   Psychiatric/Behavioral: Positive for depression, suicidal ideas and hallucinations. The patient is nervous/anxious.     Blood pressure 201/83, pulse 79, temperature 98 F (36.7 C), temperature source Oral, resp. rate 16, height 5' 6"  (1.676 m), weight 72.576 kg (160 lb), SpO2 100 %.Body mass index is 25.84 kg/(m^2).  General Appearance: Casual  Eye Contact::  Fair  Speech:  Slurred" no teeth."  Volume:  Normal  Mood:  Angry and upset and irritable  Affect:  Constricted  Thought Process:  Disorganized  Orientation:  Full (Time, Place, and Person)  Thought Content:  Hallucinations: Auditory Command:  telling him to hurt himself" Olfactory  Suicidal Thoughts:  Yes.  with intent/plan but contracts for safety here.  Homicidal Thoughts:  No  Memory:  Immediate;   Fair Recent;   Fair Remote;   Fair adequate  Judgement:  Poor  Insight:  Lacking  Psychomotor Activity:  Normal  Concentration:  Poor  Recall:  Poor  Fund of Knowledge:Fair  Language: Fair  Akathisia:  No  Handed:  Right  AIMS (if indicated):     Assets:  Communication Skills Desire for Improvement Housing Social Support Transportation  ADL's:  Intact  Cognition: WNL  Sleep:      Medical Decision Making: Review of Psycho-Social Stressors  (1)  Treatment Plan Summary: Plan Inpt to [facility] for follow up and help as needed.   Plan:  Recommend psychiatric Inpatient admission when medically cleared. Disposition: as above  Shakeeta Godette K 11/07/2014 5:03 PM

## 2014-11-07 NOTE — BH Assessment (Signed)
Assessment Note  Daniel Holmes is an 44 y.o. male who presents to the ER after voicing SI with the attempt of choking his self with a belt. Pt. States he was suicidal due to being stressed out at his [place]. Pt. Speech was difficult to understand. However, he was clear that he didn't want to return to the [facility] and that he will continue to kill himself if he returned.  Pt. Has MR diagnosis and limited in his thinking & processing skills.  According to [facility] staff, the pt. Was doing well this morning. They were doing their normal morning routine and completing their Saturday morning chores. Pt. Started stating he was unable to "handle it."  Went into his room and close the door. Staff went in behind him and discovered that he had taking his belt off and wrapped it around his neck. Pt. Began to state he was going to kill himself. His ER visit on 11/02/2014, the pt. Made similar statements. He used the cardboard, from the inner/middle of toilet tissue, and rubbed in the surface of his forearm as a means to cut his wrist, to kill himself.  Current behaviors he is displaying are out of character for him   According to the [facility] staff (Mr. [phone]), the pt. Behavior has changed over the course of several weeks. He further explains, the pt. Has been there for approximately 3 years. Prior to that he was living at another [facility] that was closed by the state and it resulted in him being an emergency placement with the current Group Home.  The current residents that are in the [facility] were the same ones he was living with before. They have been together for approximately 10 years.  According to the mood changes, the pt. Has been stable and had no problems. He has gone approximately 10 years with no hospitalizations, any required psychiatric treatment or behavioral plans.   According to records, pt.'s Father is his Guardian.  Axis I: Bipolar, Depressed Axis II: Mental  retardation, severity unknown Axis III:  Past Medical History  Diagnosis Date  . Psychosis     See Dr Orene [name]   Axis IV: educational problems, other psychosocial or environmental problems, problems related to social environment and problems with primary support group  Past Medical History:  Past Medical History  Diagnosis Date  . Psychosis     See Dr Laverta [place] [name]    History reviewed. No pertinent past surgical history.  Family History: No family history on file.  Social History:  reports that he has quit smoking. His smoking use included Cigarettes. He smoked 1.00 pack per day. He does not have any smokeless tobacco history on file. He reports that he does not drink alcohol or use illicit drugs.  Additional Social History:  Alcohol / Drug Use Pain Medications: None Reported Prescriptions: None Reported Over the Counter: None Reported History of alcohol / drug use?: No history of alcohol / drug abuse Longest period of sobriety (when/how long): None Reported Negative Consequences of Use:  (None Reported) Withdrawal Symptoms:  (None Reported)  CIWA: CIWA-Ar BP: (!) 201/83 mmHg Pulse Rate: 79 COWS:    Allergies:  Allergies  Allergen Reactions  . Penicillins Swelling    Home Medications:  (Not in a hospital admission)  OB/GYN Status:  No LMP for male patient.  General Assessment Data Location of Assessment: [facility] ED TTS Assessment: In system Is this a Tele  or Face-to-Face Assessment?: Face-to-Face Is this an Initial Assessment or a Re-assessment for this encounter?: Initial Assessment Marital status: Single Maiden name: n/a Is patient pregnant?: No Pregnancy Status: No Living Arrangements: Group Home ([facility] Can pt return to current living arrangement?: Yes Admission Status: Voluntary Is patient capable of signing voluntary admission?: No Referral Source: Self/Family/Friend Insurance type:  Medicaid  Medical Screening Exam ([place]) Medical Exam completed: Yes  Crisis Care Plan Living Arrangements: Group Home ([facility] Name of Psychiatrist: n/a Name of Therapist: n/a  Education Status Is patient currently in school?: No Current Grade: n/a Highest grade of school patient has completed: Unknown Name of school: n/a Contact person: n/a  Risk to self with the past 6 months Suicidal Ideation: Yes-Currently Present Suicidal Intent: No Has patient had any suicidal intent within the past 6 months prior to admission? : Yes (Per his report.) Is patient at risk for suicide?: Yes Suicidal Plan?: Yes-Currently Present (Put a belt around his neck) Has patient had any suicidal plan within the past 6 months prior to admission? : Yes Specify Current Suicidal Plan: "Put a belt around my neck." Access to Means: No What has been your use of drugs/alcohol within the last 12 months?: None Reported Previous Attempts/Gestures: Yes How many times?: 1 Other Self Harm Risks: Previous admission, he attempted to cut his wrist with a toilet tissue roll. Triggers for Past Attempts: Other (Comment) (When he doesn't get his way and when stressed.) Intentional Self Injurious Behavior: Bruising Comment - Self Injurious Behavior: Put a belt around his neck. Family Suicide History: Unknown Recent stressful life event(s): Conflict (Comment) (Group Home dynamics are changing. ) Persecutory voices/beliefs?: No Depression: Yes Depression Symptoms: Feeling angry/irritable, Feeling worthless/self pity, Isolating Substance abuse history and/or treatment for substance abuse?: No Suicide prevention information given to non-admitted patients: Not applicable  Risk to Others within the past 6 months Homicidal Ideation: No Does patient have any lifetime risk of violence toward others beyond the six months prior to admission? : No Thoughts of Harm to Others: No Current Homicidal Intent:  No Current Homicidal Plan: No Access to Homicidal Means: No Identified Victim: None Reported History of harm to others?: No Assessment of Violence: None Noted Violent Behavior Description: None Reported Does patient have access to weapons?: No Criminal Charges Pending?: No Does patient have a court date: No Is patient on probation?: No  Psychosis Hallucinations: None noted Delusions: None noted  Mental Status Report Appearance/Hygiene: Unremarkable, In scrubs, In hospital gown Eye Contact: Good Motor Activity: Freedom of movement, Unremarkable Speech: Soft, Slurred (Difficult to understand) Level of Consciousness: Alert, Irritable Mood: Anxious, Irritable, Preoccupied Affect: Anxious, Appropriate to circumstance, Irritable Anxiety Level: Moderate Thought Processes: Coherent, Relevant Judgement: 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, Low IQ) Orientation: Person, Place, Situation, Appropriate for developmental age Obsessive Compulsive Thoughts/Behaviors: Minimal  Cognitive Functioning Concentration: Normal Memory: Recent Intact, Remote Intact IQ: Below Average Level of Function: Mild Insight: Poor Impulse Control: Poor Appetite: Poor Weight Loss: 0 Weight Gain: 0 Sleep: No Change Total Hours of Sleep: 7 Vegetative Symptoms: None  ADLScreening [facility] Assessment Services) Patient's cognitive ability adequate to safely complete daily activities?: Yes Patient able to express need for assistance with ADLs?: Yes Independently performs ADLs?: Yes (appropriate for developmental age)  Prior Inpatient Therapy Prior Inpatient Therapy: Yes Prior Therapy Dates: 10/2014 Prior Therapy Facilty/Provider([facility]): [facility] Reason for Treatment: Depression and Suicidal Gestures  Prior Outpatient Therapy Prior Outpatient Therapy: No Prior Therapy Dates: n/a Prior Therapy Facilty/Provider(s): n/a Reason  for Treatment: bipolar Does patient have an ACCT team?: No Does patient have Intensive In-House Services?   : No Does patient have [facility] services? : No Does patient have P4CC services?: No  ADL Screening (condition at time of admission) Patient's cognitive ability adequate to safely complete daily activities?: Yes Patient able to express need for assistance with ADLs?: Yes Independently performs ADLs?: Yes (appropriate for developmental age)       Abuse/Neglect Assessment (Assessment to be complete while patient is alone) Physical Abuse: Denies Verbal Abuse: Denies Sexual Abuse: Denies Exploitation of patient/patient's resources: Denies Self-Neglect: Denies Values / Beliefs Cultural Requests During Hospitalization: None Spiritual Requests During Hospitalization: None Consults Spiritual Care Consult Needed: No Social Work Consult Needed: No Regulatory affairs officer (For Healthcare) Does patient have an advance directive?: No Would patient like information on creating an advanced directive?: Yes Higher education careers adviser given    Additional Information 1:1 In Past 12 Months?: No CIRT Risk: No Elopement Risk: No Does patient have medical clearance?: Yes  Child/Adolescent Assessment Running Away Risk: Denies (Pt. is an adult)  Disposition:  Disposition Initial Assessment Completed for this Encounter: Yes Disposition of Patient: Other dispositions (Psych MD to see) Other disposition(s): Other (Comment) (Psych MD to see) Patient referred to: Other (Comment) (Psych MD to see)  On Site Evaluation by:   Reviewed with Physician:    Gunnar Fusi, MS, LCAS, LPC, [place], CCSI 11/07/2014 5:29 PM

## 2014-11-07 NOTE — ED Provider Notes (Addendum)
[facility] Emergency Department Provider Note     Time seen: ----------------------------------------- 12:29 PM on 11/07/2014 -----------------------------------------    I have reviewed the triage vital signs and the nursing notes.   HISTORY  Chief Complaint Suicide Attempt    HPI Daniel Holmes is a 44 y.o. male who presents ER for attempt at suicide from the group home. Patient was found trying to hang himself with a spell this morning. Abrasions were noted to both inner elbows from him scratching and abrading. She doesn't make to suicidal ideation, denies homicidal ideation.   Past Medical History  Diagnosis Date  . Psychosis     See Dr Laverta [name]    Patient Active Problem List   Diagnosis Date Noted  . 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 Bipolar disorder with depression 11/03/2014  . Arthritis 11/03/2014  . 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 History of ADHD 11/03/2014  . Allergic rhinitis 11/05/2012  . Hypothyroidism 03/28/2012    History reviewed. No pertinent past surgical history.  Allergies Penicillins  Social History History  Substance Use Topics  . Smoking status: Former Smoker -- 1.00 packs/day    Types: Cigarettes  . Smokeless tobacco: Not on file  . Alcohol Use: No    Review of Systems Constitutional: Negative for fever. Eyes: Negative for visual changes. ENT: Negative for sore throat. Cardiovascular: Negative for chest pain. Respiratory: Negative for shortness of breath. Gastrointestinal: Negative for abdominal pain, vomiting and diarrhea. Genitourinary: Negative for dysuria. Musculoskeletal: Negative for back pain. Skin: Negative for rash. Neurological: Negative for headaches, focal weakness or numbness. Psychiatric: Positive for suicidal ideation 10-point ROS otherwise negative.  ____________________________________________   PHYSICAL EXAM:  VITAL SIGNS: ED  Triage Vitals  Enc Vitals Group     BP 11/07/14 1218 201/83 mmHg     Pulse Rate 11/07/14 1218 79     Resp 11/07/14 1218 16     Temp 11/07/14 1218 98 F (36.7 C)     Temp Source 11/07/14 1218 Oral     SpO2 11/07/14 1218 100 %     Weight 11/07/14 1218 160 lb (72.576 kg)     Height 11/07/14 1218 5\' 6"  (1.676 m)     Head Cir --      Peak Flow --      Pain Score 11/07/14 1219 0     Pain Loc --      Pain Edu? --      Excl. in [place]? --     Constitutional: Alert and oriented. Well appearing and in no distress. Eyes: Conjunctivae are normal. PERRL. Normal extraocular movements. ENT   Head: Normocephalic and atraumatic.   Nose: No congestion/rhinnorhea.   Mouth/Throat: Mucous membranes are moist.   Neck: No stridor. Hematological/Lymphatic/Immunilogical: No cervical lymphadenopathy. Cardiovascular: Normal rate, regular rhythm. Normal and symmetric distal pulses are present in all extremities. No murmurs, rubs, or gallops. Respiratory: Normal respiratory effort without tachypnea nor retractions. Breath sounds are clear and equal bilaterally. No wheezes/rales/rhonchi. Gastrointestinal: Soft and nontender. No distention. No abdominal bruits. There is no CVA tenderness. Musculoskeletal: Nontender with normal range of motion in all extremities. No joint effusions.  No lower extremity tenderness nor edema. Neurologic:  Normal speech and language. No gross focal neurologic deficits are appreciated. Speech is normal. No gait instability. Skin:  Skin is warm, dry and intact. No rash noted. Psychiatric: Patient with positive suicidal ideation, denies HI   ____________________________________________  ED COURSE:  Pertinent labs & imaging results  that were available during my care of the patient were reviewed by me and considered in my medical decision making (see chart for details). Patient will need clearance from psychiatry before clearance to the group  home ____________________________________________    LABS (pertinent positives/negatives)  Labs Reviewed  ACETAMINOPHEN LEVEL - Abnormal; Notable for the following:    Acetaminophen (Tylenol), Serum <10 (*)    All other components within normal limits  CBC - Abnormal; Notable for the following:    RBC 3.94 (*)    Hemoglobin 12.4 (*)    HCT 37.0 (*)    All other components within normal limits  URINALYSIS COMPLETEWITH MICROSCOPIC ([facility] ONLY) - Abnormal; Notable for the following:    Color, Urine COLORLESS (*)    APPearance CLEAR (*)    Specific Gravity, Urine 1.002 (*)    Squamous Epithelial / LPF 0-5 (*)    All other components within normal limits  COMPREHENSIVE METABOLIC PANEL  ETHANOL  SALICYLATE LEVEL  URINE DRUG SCREEN, QUALITATIVE ([facility] ONLY)    RADIOLOGY  None  ____________________________________________  FINAL ASSESSMENT AND PLAN  Suicidal ideation  Plan: Patient is in no acute distress this time, stable pending psychiatric consultation   Earleen Newport, MD   Earleen Newport, MD 11/07/14 Richardson, MD 11/07/14 1455

## 2014-11-07 NOTE — ED Notes (Signed)
Pt said he was hearing voices.  RN asked pt if he could tell the voices to stop and leave him alone.  Pt said that he couldn't do that.  RN promised to return to check on pt in a little while.  Pt said that was OK.

## 2014-11-07 NOTE — ED Notes (Signed)
Patient assigned to appropriate care area. Patient oriented to unit/care area: Informed that, for their safety, care areas are designed for safety and monitored by security cameras at all times; and visiting hours explained to patient. Patient verbalizes understanding, and verbal contract for safety obtained. 

## 2014-11-07 NOTE — B[facility] Counselor (Addendum)
Venetia Constable is the [facility] who managed pt. Medicaid. Writer is unable to access [employer] to submit TAR.  Writer called [employer] ([phone]) to verify they weren't the [facility] managing pt. Medicaid. [employer] looked it up in [place] and it's under [place].

## 2014-11-07 NOTE — ED Notes (Signed)
Transfer to BHU  

## 2014-11-08 ENCOUNTER — Inpatient Hospital Stay
Admission: EM | Admit: 2014-11-08 | Discharge: 2014-11-10 | DRG: 880 | Disposition: A | Discharge status: Home / Self Care | Payer: Medicaid Other | Source: Intra-hospital | Attending: Psychiatry | Admitting: Psychiatry

## 2014-11-08 DIAGNOSIS — F79 Unspecified intellectual disabilities: Secondary | ICD-10-CM | POA: Diagnosis present

## 2014-11-08 DIAGNOSIS — F909 Attention-deficit hyperactivity disorder, unspecified type: Secondary | ICD-10-CM | POA: Diagnosis present

## 2014-11-08 DIAGNOSIS — Z8659 Personal history of other mental and behavioral disorders: Secondary | ICD-10-CM

## 2014-11-08 DIAGNOSIS — G8929 Other chronic pain: Secondary | ICD-10-CM | POA: Diagnosis present

## 2014-11-08 DIAGNOSIS — M1990 Unspecified osteoarthritis, unspecified site: Secondary | ICD-10-CM | POA: Diagnosis present

## 2014-11-08 DIAGNOSIS — F70 Mild intellectual disabilities: Secondary | ICD-10-CM | POA: Diagnosis present

## 2014-11-08 DIAGNOSIS — J302 Other seasonal allergic rhinitis: Secondary | ICD-10-CM | POA: Diagnosis present

## 2014-11-08 DIAGNOSIS — Z880 Allergy status to penicillin: Secondary | ICD-10-CM | POA: Diagnosis not present

## 2014-11-08 DIAGNOSIS — J309 Allergic rhinitis, unspecified: Secondary | ICD-10-CM | POA: Diagnosis present

## 2014-11-08 DIAGNOSIS — G4700 Insomnia, unspecified: Secondary | ICD-10-CM | POA: Diagnosis present

## 2014-11-08 DIAGNOSIS — R4587 Impulsiveness: Secondary | ICD-10-CM | POA: Diagnosis present

## 2014-11-08 DIAGNOSIS — E039 Hypothyroidism, unspecified: Secondary | ICD-10-CM | POA: Diagnosis present

## 2014-11-08 DIAGNOSIS — R440 Auditory hallucinations: Secondary | ICD-10-CM | POA: Diagnosis present

## 2014-11-08 DIAGNOSIS — M79606 Pain in leg, unspecified: Secondary | ICD-10-CM | POA: Diagnosis present

## 2014-11-08 DIAGNOSIS — F319 Bipolar disorder, unspecified: Secondary | ICD-10-CM | POA: Diagnosis present

## 2014-11-08 DIAGNOSIS — F17210 Nicotine dependence, cigarettes, uncomplicated: Secondary | ICD-10-CM | POA: Diagnosis present

## 2014-11-08 DIAGNOSIS — R45851 Suicidal ideations: Principal | ICD-10-CM | POA: Diagnosis present

## 2014-11-08 DIAGNOSIS — F800 Phonological disorder: Secondary | ICD-10-CM | POA: Diagnosis present

## 2014-11-08 DIAGNOSIS — F3130 Bipolar disorder, current episode depressed, mild or moderate severity, unspecified: Secondary | ICD-10-CM | POA: Diagnosis not present

## 2014-11-08 DIAGNOSIS — K219 Gastro-esophageal reflux disease without esophagitis: Secondary | ICD-10-CM | POA: Diagnosis present

## 2014-11-08 MED ORDER — OLANZAPINE 10 MG PO TABS
30.0000 mg | ORAL_TABLET | Freq: Every day | ORAL | Status: DC
  Administered 2014-11-08 – 2014-11-09 (×2): 30 mg via ORAL
  Filled 2014-11-08 (×3): qty 3

## 2014-11-08 MED ORDER — ALUM & MAG HYDROXIDE-SIMETH 200-200-20 MG/5ML PO SUSP: 30.0000 mL | ORAL | Status: DC | PRN

## 2014-11-08 MED ORDER — LEVOTHYROXINE SODIUM 75 MCG PO TABS
75.0000 ug | ORAL_TABLET | Freq: Every day | ORAL | Status: DC
  Administered 2014-11-08 – 2014-11-10 (×3): 75 ug via ORAL
  Filled 2014-11-08 (×3): qty 1

## 2014-11-08 MED ORDER — ADULT MULTIVITAMIN W/MINERALS CH
1.0000 | ORAL_TABLET | Freq: Every day | ORAL | Status: DC
  Administered 2014-11-08 – 2014-11-10 (×3): 1 via ORAL
  Filled 2014-11-08 (×3): qty 1

## 2014-11-08 MED ORDER — TRAZODONE HCL 50 MG PO TABS
150.0000 mg | ORAL_TABLET | Freq: Every day | ORAL | Status: DC
  Administered 2014-11-08 – 2014-11-09 (×2): 150 mg via ORAL
  Filled 2014-11-08 (×3): qty 1

## 2014-11-08 MED ORDER — MAGNESIUM HYDROXIDE 400 MG/5ML PO SUSP: 30.0000 mL | Freq: Every day | ORAL | Status: DC | PRN

## 2014-11-08 MED ORDER — CLONIDINE HCL 0.1 MG PO TABS
0.1000 mg | ORAL_TABLET | Freq: Two times a day (BID) | ORAL | Status: DC
  Administered 2014-11-08 – 2014-11-10 (×5): 0.1 mg via ORAL
  Filled 2014-11-08 (×6): qty 1

## 2014-11-08 MED ORDER — ACETAMINOPHEN 325 MG PO TABS
650.0000 mg | ORAL_TABLET | Freq: Four times a day (QID) | ORAL | Status: DC | PRN
  Administered 2014-11-09 – 2014-11-10 (×3): 650 mg via ORAL
  Filled 2014-11-08 (×4): qty 2

## 2014-11-08 MED ORDER — LORATADINE 10 MG PO TABS: 10.0000 mg | ORAL_TABLET | Freq: Every day | ORAL | Status: DC | PRN

## 2014-11-08 MED ORDER — LITHIUM CARBONATE ER 450 MG PO TBCR
450.0000 mg | EXTENDED_RELEASE_TABLET | Freq: Two times a day (BID) | ORAL | Status: DC
  Administered 2014-11-08 – 2014-11-10 (×5): 450 mg via ORAL
  Filled 2014-11-08 (×5): qty 1

## 2014-11-08 MED ORDER — PANTOPRAZOLE SODIUM 40 MG PO TBEC
40.0000 mg | DELAYED_RELEASE_TABLET | Freq: Every day | ORAL | Status: DC
  Administered 2014-11-08 – 2014-11-10 (×3): 40 mg via ORAL
  Filled 2014-11-08 (×3): qty 1

## 2014-11-08 MED ORDER — NICOTINE 21 MG/24HR TD PT24
21.0000 mg | MEDICATED_PATCH | Freq: Every day | TRANSDERMAL | Status: DC
  Filled 2014-11-08: qty 1

## 2014-11-08 MED ORDER — VITAMIN D (ERGOCALCIFEROL) 1.25 MG (50000 UNIT) PO CAPS
50000.0000 [IU] | ORAL_CAPSULE | ORAL | Status: DC
  Administered 2014-11-08: 50000 [IU] via ORAL
  Filled 2014-11-08: qty 1

## 2014-11-08 NOTE — [facility] Group Notes (Signed)
[place] [facility] Group Therapy  11/08/2014 3:58 PM  Type of Therapy:  Group Therapy  Participation Level:  Did Not Attend  Participation Quality:    Affect:    Cognitive:    Insight:    Engagement in Therapy:    Modes of Intervention:    Summary of Progress/Problems:  Joana Reamer 11/08/2014, 3:58 PM

## 2014-11-08 NOTE — ED Notes (Signed)
BEHAVIORAL HEALTH ROUNDING  Patient sleeping: Yes.  Patient alert and oriented: no  Behavior appropriate: Yes. ; If no, describe:  Nutrition and fluids offered: No  Toileting and hygiene offered: No  Sitter present: no  Law enforcement present: Yes   

## 2014-11-08 NOTE — Progress Notes (Signed)
[facility] nurse) in to interview Pt. Ms. Daniel Holmes stated she would contact Pt's guardian to gather more information. An examination has not been done at this point.

## 2014-11-08 NOTE — ED Notes (Signed)
BEHAVIORAL HEALTH ROUNDING Patient sleeping: No. Patient alert and oriented: yes Behavior appropriate: Yes.  ; If no, describe:  Nutrition and fluids offered: Yes  Toileting and hygiene offered: Yes  Sitter present: not applicable Law enforcement present: Yes ODS/shift

## 2014-11-08 NOTE — ED Notes (Signed)
Pt. Up using bathroom. 
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
## 2014-11-08 NOTE — ED Notes (Signed)
Pt sitting in dayroom; engaging in conversation with other pt.

## 2014-11-08 NOTE — ED Provider Notes (Signed)
Patient admitted to inpatient psych, but apparently still pending bed availability. No events overnight.  Lavonia Drafts, MD 11/08/14 [phone]
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
## 2014-11-08 NOTE — Plan of Care (Signed)
Problem: Ineffective Coping Goal: Demonstrates ability to cope effectively Patient able to name and demonstrate 3 coping skills for managing stressors.  Outcome: Progressing Patient states that he would like to cope with his stressors more effectively. Patient to attend therapy groups and to practice deep breathing, participating in therapy groups and communicating with staff.

## 2014-11-08 NOTE — BHH Suicide Risk Assessment (Signed)
[facility] Admission Suicide Risk Assessment   Nursing information obtained from:    Demographic factors:    Current Mental Status:    Loss Factors:    Historical Factors:    Risk Reduction Factors:    Total Time spent with patient: 1 hour Principal Problem: Bipolar disorder with depression Diagnosis:   Patient Active Problem List   Diagnosis Date Noted  . 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 Bipolar disorder with depression [F31.30] 11/03/2014  . Arthritis [M19.90] 11/03/2014  . 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 History of ADHD [Z86.59] 11/03/2014  . Allergic rhinitis [J30.9] 11/05/2012  . Hypothyroidism [E03.9] 03/28/2012     Continued Clinical Symptoms:    The "Alcohol Use Disorders Identification Test", Guidelines for Use in Primary Care, Second Edition.  World Pharmacologist [facility]). Score between 0-7:  no or low risk or alcohol related problems. Score between 8-15:  moderate risk of alcohol related problems. Score between 16-19:  high risk of alcohol related problems. Score 20 or above:  warrants further diagnostic evaluation for alcohol dependence and treatment.   CLINICAL FACTORS:   Severe Anxiety and/or Agitation More than one psychiatric diagnosis Previous Psychiatric Diagnoses and Treatments   Psychiatric Specialty Exam: Physical Exam  ROS   COGNITIVE FEATURES THAT CONTRIBUTE TO RISK:  Closed-mindedness    SUICIDE RISK:   Moderate:  Frequent suicidal ideation with limited intensity, and duration, some specificity in terms of plans, no associated intent, good self-control, limited dysphoria/symptomatology, some risk factors present, and identifiable protective factors, including available and accessible social support.  PLAN OF CARE: Admit to behavioral health  Medical Decision Making:  Established Problem, Worsening (2)  I certify that inpatient services furnished can reasonably be expected to improve the patient's condition.    Hildred Priest 11/08/2014, 1:00 PM

## 2014-11-08 NOTE — ED Notes (Signed)
Pt c/o 5/10 headache.  MD informed.  See MAR.

## 2014-11-08 NOTE — ED Notes (Signed)
Pt is being discharged from ED B[facility] and will be readmitted to BMU.

## 2014-11-08 NOTE — ED Notes (Signed)

## 2014-11-08 NOTE — ED Notes (Signed)
BEHAVIORAL HEALTH ROUNDING Patient sleeping: No. Patient alert and oriented: yes Behavior appropriate: Yes.  ; If no, describe:  Nutrition and fluids offered: Yes  Toileting and hygiene offered: Yes  Sitter present: not applicable Law enforcement present: Yes  

## 2014-11-08 NOTE — Progress Notes (Signed)
error 
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
## 2014-11-08 NOTE — ED Notes (Signed)
ED BHU [place] Is the patient under IVC or is there intent for IVC: No. Is the patient medically cleared: Yes.   Is there vacancy in the ED BHU: Yes.   Is the population mix appropriate for patient: Yes.   Is the patient awaiting placement in inpatient or outpatient setting: Yes.   Has the patient had a psychiatric consult: Yes.   Survey of unit performed for contraband, proper placement and condition of furniture, tampering with fixtures in bathroom, shower, and each patient room: Yes.  ; Findings:  APPEARANCE/BEHAVIOR calm, cooperative and adequate rapport can be established NEURO ASSESSMENT Orientation: place and person Hallucinations: No.None noted (Hallucinations) Speech: Normal Gait: normal RESPIRATORY ASSESSMENT Normal expansion.  Clear to auscultation.  No rales, rhonchi, or wheezing. CARDIOVASCULAR ASSESSMENT regular rate and rhythm, S1, S2 normal, no murmur, click, rub or gallop GASTROINTESTINAL ASSESSMENT soft, nontender, BS WNL, no r/g EXTREMITIES normal strength, tone, and muscle mass PLAN OF CARE Provide calm/safe environment. Vital signs assessed twice daily. ED BHU Assessment once each 12-hour shift. Collaborate with intake RN daily or as condition indicates. Assure the ED provider has rounded once each shift. Provide and encourage hygiene. Provide redirection as needed. Assess for escalating behavior; address immediately and inform ED provider.  Assess family dynamic and appropriateness for visitation as needed: Yes.  ; If necessary, describe findings:  Educate the patient/family about BHU procedures/visitation: Yes.  ; If necessary, describe findings:
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
## 2014-11-09 ENCOUNTER — Encounter: Payer: Self-pay | Admitting: Psychiatry

## 2014-11-09 DIAGNOSIS — F3130 Bipolar disorder, current episode depressed, mild or moderate severity, unspecified: Secondary | ICD-10-CM

## 2014-11-09 LAB — LITHIUM LEVEL: Lithium Lvl: 0.47 mmol/L — ABNORMAL LOW (ref 0.60–1.20)

## 2014-11-09 MED ORDER — WHITE PETROLATUM GEL
Status: DC | PRN
  Administered 2014-11-09: 13:00:00 via TOPICAL
  Filled 2014-11-09: qty 5

## 2014-11-09 NOTE — Tx Team (Addendum)
Initial Interdisciplinary Treatment Plan   PATIENT STRESSORS: Financial difficulties Medication change or noncompliance   PATIENT STRENGTHS: General fund of knowledge Motivation for treatment/growth   PROBLEM LIST: Problem List/Patient Goals Date to be addressed Date deferred Reason deferred Estimated date of resolution  Suicide risk 11/08/2014     Depression 11/08/2014     Living arrangements 11/08/2014                                          DISCHARGE CRITERIA:  Adequate post-discharge living arrangements Improved stabilization in mood, thinking, and/or behavior  PRELIMINARY DISCHARGE PLAN: Attend aftercare/continuing care group Placement in alternative living arrangements  PATIENT/FAMIILY INVOLVEMENT: This treatment plan has been presented to and reviewed with the patient, ABRAHIM SARGENT, and/or family member,  The patient and family have been given the opportunity to ask questions and make suggestions.  Rica Records Graylen [place] 11/09/2014, 12:23 PM
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
## 2014-11-09 NOTE — Progress Notes (Signed)
Recreation Therapy Notes  Date: 06.13.16 Time: 3:10 pm Location: Craft Room  Group Topic: Self-expression  Goal Area(s) Addresses:  Patient will be able to identify a color that represents each emotion. Patient will verbalize benefit of using art as a means of self-expression. Patient will verbalize one positive emotion experienced while participating in group.  Behavioral Response: Attentive  Intervention: The Colors Within Me  Activity: Patients were given a blank face and instructed to analyze what emotions they were feeling. For each emotion they felt, they were instructed to pick a color and show how much of that emotion they were feeling on the worksheet.  Education: LRT educated patients on different forms of self-expression.   Education Outcome: Acknowledges education/In group clarification offered  Clinical Observations/Feedback: Patient did not participate in activity. Patient left group at approximately 3:26 pm to use the bathroom. Patient returned to group at approximately 3:30 pm. Patient did not contribute to group discussion.  Leonette Monarch, LRT/CTRS 11/09/2014 4:28 PM

## 2014-11-09 NOTE — Progress Notes (Signed)
Patient pleasant and cooperative upon my assessment. Patient completed Patient Self Inventory, reports  and  appetite is good.Patient rates depression as  2 /10, patient rates. Patient denies SI/HI, denies A/V hallucinations.   Patient offered support and encouragement, patient encouraged to discuss feelings/concerns with staff. Patient verbalized understanding. Patient monitored Q15 minutes for safety. Patient met with MD  to discuss today's goals and plan of care.  Patient visible in milieu, attending groups in day room and meals in dining room. Patient appropriate with staff and peers.   Patient taking medications as ordered. Will continue to monitor.

## 2014-11-09 NOTE — Progress Notes (Signed)
D: Patient denies HI.  Patient does state that he is hearing voices telling him to hurt himself but agrees to contract.  Patient affect and mood are anxious.  Patient did attend evening group. Patient visible on the milieu. No distress noted. A: Support and encouragement offered. Scheduled medications given to pt. Q 15 min checks continued for patient safety. R: Patient receptive. Patient remains safe on the unit.

## 2014-11-09 NOTE — BHH Group Notes (Signed)
[place] Group Notes:  (Nursing/MHT/Case Management/Adjunct)  Date:  11/09/2014  Time:  3:01 PM  Type of Therapy:  Psychoeducational Skills  Participation Level:  Active  Participation Quality:  Intrusive, Inattentive and Sharing  Affect:  Appropriate and Excited  Cognitive:  Alert, Disorganized, Confused and Lacking  Insight:  Limited  Engagement in Group:  Developing/Improving, Distracting, Lacking, Limited and Off Topic  Modes of Intervention:  Discussion, Education and Support  Summary of Progress/Problems:  Daniel Holmes [facility] 11/09/2014, 3:01 PM

## 2014-11-09 NOTE — Plan of Care (Signed)
Problem: Diagnosis: Increased Risk For Suicide Attempt Goal: LTG-Patient Will Report Improved Mood and Deny Suicidal LTG (by discharge) Patient will report improved mood and deny suicidal ideation.  Outcome: Progressing Patient denies suicidal ideation.

## 2014-11-10 LAB — HIV ANTIBODY (ROUTINE TESTING W REFLEX): HIV SCREEN 4TH GENERATION: NONREACTIVE

## 2014-11-10 MED ORDER — PANTOPRAZOLE SODIUM 40 MG PO TBEC: 40.0000 mg | DELAYED_RELEASE_TABLET | Freq: Every day | ORAL | Status: DC

## 2014-11-10 MED ORDER — MULTIVITAMIN ADULTS 50+ PO TABS: 1.0000 | ORAL_TABLET | Freq: Every day | ORAL | Status: DC

## 2014-11-10 MED ORDER — TRAZODONE HCL 150 MG PO TABS: 150.0000 mg | ORAL_TABLET | Freq: Every day | ORAL | Status: DC

## 2014-11-10 MED ORDER — OLANZAPINE 20 MG PO TABS
30.0000 mg | ORAL_TABLET | Freq: Every day | ORAL | Status: AC
Start: 2014-11-10 — End: ?

## 2014-11-10 MED ORDER — LEVOTHYROXINE SODIUM 75 MCG PO TABS: 75.0000 ug | ORAL_TABLET | Freq: Every day | ORAL | Status: DC

## 2014-11-10 MED ORDER — CLONIDINE HCL 0.1 MG PO TABS: 0.1000 mg | ORAL_TABLET | Freq: Two times a day (BID) | ORAL | Status: DC

## 2014-11-10 MED ORDER — LORATADINE 10 MG PO TABS: 10.0000 mg | ORAL_TABLET | Freq: Every day | ORAL | Status: DC | PRN

## 2014-11-10 MED ORDER — LITHIUM CARBONATE ER 450 MG PO TBCR: 450.0000 mg | EXTENDED_RELEASE_TABLET | Freq: Two times a day (BID) | ORAL | Status: AC

## 2014-11-10 MED ORDER — VITAMIN D (ERGOCALCIFEROL) 1.25 MG (50000 UNIT) PO CAPS: 50000.0000 [IU] | ORAL_CAPSULE | ORAL | Status: DC

## 2014-11-10 NOTE — Progress Notes (Signed)
  [facility] Adult Case Management Discharge Plan :  Will you be returning to the same living situation after discharge:  Yes,  back to [facility] group home At discharge, do you have transportation home?: Yes,  group home will pick up Do you have the ability to pay for your medications: Yes,  patient has insurance  Release of information consent forms completed and in the chart;  Patient's signature needed at discharge.  Patient to Follow up at: Follow-up Information    Follow up with [facility]. Go in 3 days.   Why:  For follow-up care; 9:40am appointment   Contact information:   [place], [place] [phone] Fax [phone]      Patient denies SI/HI: Yes,  patient denies SI/HI    Safety Planning and Suicide Prevention discussed: Yes,  SPE discussed with patient and group home staff  Have you used any form of tobacco in the last 30 days? (Cigarettes, Smokeless Tobacco, Cigars, and/or Pipes): No  Has patient been referred to the Quitline?: N/A patient is not a smoker  Carmell Austria T 11/10/2014, 4:19 PM

## 2014-11-10 NOTE — Plan of Care (Signed)
Problem: Diagnosis: Increased Risk For Suicide Attempt Goal: LTG-Patient Will Report Improvement in Psychotic Symptoms LTG (by discharge) : Patient will report improvement in psychotic symptoms.  Outcome: Progressing Patient denies SI/HI/AVH at this time. Patient actively participating in plan of care, cooperative with meds and attends therapy groups today.

## 2014-11-10 NOTE — BHH Group Notes (Signed)
[place] Group Notes:  (Nursing/MHT/Case Management/Adjunct)  Date:  11/10/2014  Time:  12:59 AM  Type of Therapy:  Group Therapy  Participation Level:  Active  Participation Quality:  Appropriate  Affect:  Appropriate  Cognitive:  Appropriate  Insight:  Limited  Engagement in Group:  Engaged  Modes of Intervention:  n/a  Summary of Progress/Problems:  Daniel Holmes 11/10/2014, 12:59 AM
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
## 2014-11-10 NOTE — Progress Notes (Signed)
Patient discharged at this time to [facility]. Patient belongings returned. Patient has his dentures with him. Verbalizes understanding related to recommended discharge plan of care. Denies SI/HI/AVH at this time. Safety maintained.

## 2014-11-10 NOTE — Progress Notes (Signed)
Patient with appropriate affect and cooperative behavior with meals, meds and plan of care. Denies SI/HI/AVH at this time. Good adls. Actively participating in plan of care, attending therapy groups. Verbalizes needs well and social with peers, watching tv and talking in day room. Patient to discharge today when group home arrives for transport. Safety maintained.

## 2014-11-10 NOTE — Progress Notes (Signed)
Patient rates his depression a"10"; continues to have thoughts of suicide with a plan to stab himself in his neck; denies any SI at present and contracts for safety; denies any anxiety; admits to having auditory hallucinations; states "The voices tell me to hurt myself." Patient has been calm, quiet, and cooperative throughout the shift; no further voiced complaints; continue to monitor.

## 2014-11-10 NOTE — Clinical Social Work Note (Signed)
CSW made 1 last attempt to call patient's guardian/father Derrien Anschutz [phone] and left vm that patient is discharging but no response. [facility] [phone] will pick patient up this afternoon for discharge.

## 2014-11-10 NOTE — Clinical Social Work Note (Signed)
CSW called patient's father again this morning Dimarco Minkin [phone] to get updated information about patient's discharge plan as patient states he spoke with his father last night and stated that patient is returning to group home. CSW left vm for patient's father to call back. CSW spoke with group home [facility] [phone] and [facility] Simmond[facility] [phone] yesterday and patient's return was uncertain.

## 2014-11-10 NOTE — Plan of Care (Signed)
Problem: Ineffective individual coping Goal: STG: Patient will remain free from self harm Outcome: Progressing Patient continues to have SI, but denies any at present, and contracts for safety.

## 2014-11-10 NOTE — BHH Group Notes (Signed)
[place] LCSW Group Therapy  11/10/2014 12:47 PM  Type of Therapy:  Group Therapy  Participation Level:  Minimal  Participation Quality:  Intrusive  Affect:  Not Congruent  Cognitive:  Confused  Insight:  Lacking  Engagement in Therapy:  Lacking  Modes of Intervention:  Discussion, Education, Exploration and Support  Summary of Progress/Problems:LCSW introduced group rules and today's discussion was on Understanding SELF, patients as a whole group were asked to explore their false beliefs, how they address them to form insight about issues,thoughts or feelings. Each person was asked to reflect on what is important to them and communications styles were discussed to assist with individuals reaching their goals of self discovery. This patient was supportive of  Peers and did follow redirection well however he went in and out of group a few times   [name] 11/10/2014, 12:47 PM

## 2014-11-10 NOTE — Progress Notes (Signed)
Recreation Therapy Notes  Date: 06.14.16 Time: 3:00 pm Location: Craft Room  Group Topic: Goal Setting  Goal Area(s) Addresses:  Patient will list at least one goal. Patient will list at least one obstacle.  Behavioral Response: Attentive, Interactive  Intervention: Recovery Goal Chart  Activity: Patients were instructed to make a goal chart with goals, obstacles, the date they started working on their goal, and the date they achieved their goal.   Education: LRT educated patients on different healthy ways they can celebrate reaching their goal.   Education Outcome: Acknowledges education/In group clarification offered  Clinical Observations/Feedback: Patient completed activity with assistance from LRT. Patient contributed to group discussion.  Leonette Monarch, LRT/CTRS 11/10/2014 4:31 PM

## 2014-11-10 NOTE — Clinical Social Work Note (Signed)
CSW called patient's father Ebbie Cherry [phone] and left vm for him to call back to discussion discharge of patient.

## 2014-11-10 NOTE — BHH Suicide Risk Assessment (Signed)
[facility] Discharge Suicide Risk Assessment   Demographic Factors:  Male and Caucasian  Total Time spent with patient: 30 minutes  Musculoskeletal: Strength & Muscle Tone: within normal limits Gait & Station: normal Patient leans: N/A  Psychiatric Specialty Exam: Physical Exam  Nursing note and vitals reviewed.   Review of Systems  All other systems reviewed and are negative.   Blood pressure 106/69, pulse 71, temperature 98.2 F (36.8 C), temperature source Oral, resp. rate 20, height 5\' 7"  (1.702 m), weight 71.215 kg (157 lb), SpO2 99 %.Body mass index is 24.58 kg/(m^2).  General Appearance: Casual  Eye Contact::  Good  Speech:  Garbled409  Volume:  Normal  Mood:  Euthymic  Affect:  Appropriate  Thought Process:  Goal Directed, Linear and Logical  Orientation:  Full (Time, Place, and Person)  Thought Content:  WDL  Suicidal Thoughts:  No  Homicidal Thoughts:  No  Memory:  Immediate;   Fair Recent;   Fair Remote;   Fair  Judgement:  Fair  Insight:  Fair  Psychomotor Activity:  Normal  Concentration:  Fair  Recall:  [employer] [place]  Language: Fair  Akathisia:  No  Handed:  Right  AIMS (if indicated):     Assets:  Communication Skills Desire for Improvement Financial Resources/Insurance Housing Physical Health Resilience Social Support  Sleep:  Number of Hours: 5.45  Cognition: WNL  ADL's:  Intact   Have you used any form of tobacco in the last 30 days? (Cigarettes, Smokeless Tobacco, Cigars, and/or Pipes): No  Has this patient used any form of tobacco in the last 30 days? (Cigarettes, Smokeless Tobacco, Cigars, and/or Pipes) No  Mental Status Per Nursing Assessment::   On Admission:     Current Mental Status by Physician: NA  Loss Factors: NA  Historical Factors: Prior suicide attempts and Impulsivity  Risk Reduction Factors:   Sense of responsibility to family, Living with another person, especially a relative and Positive social  support  Continued Clinical Symptoms:  Bipolar Disorder:   Depressive phase  Cognitive Features That Contribute To Risk:  None    Suicide Risk:  Minimal: No identifiable suicidal ideation.  Patients presenting with no risk factors but with morbid ruminations; may be classified as minimal risk based on the severity of the depressive symptoms  Principal Problem: Bipolar disorder with depression Discharge Diagnoses:  Patient Active Problem List   Diagnosis Date Noted  . Bipolar 1 disorder [F31.9] 11/08/2014  . 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 Bipolar disorder with depression [F31.30] 11/03/2014  . Arthritis [M19.90] 11/03/2014  . 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 History of ADHD [Z86.59] 11/03/2014  . Allergic rhinitis [J30.9] 11/05/2012  . Hypothyroidism [E03.9] 03/28/2012      Plan Of Care/Follow-up recommendations:  Activity:  As tolerated. Diet:  Low sodium heart healthy. Other:  Keep follow-up appointments.  Is patient on multiple antipsychotic therapies at discharge:  No   Has Patient had three or more failed trials of antipsychotic monotherapy by history:  No  Recommended Plan for Multiple Antipsychotic Therapies: NA    Daniel Holmes 11/10/2014, 2:11 PM

## 2014-11-10 NOTE — [facility] Suicide Risk Assessment (Signed)
[place] INPATIENT:  Family/Significant Other Suicide Prevention Education  Suicide Prevention Education:  Education Completed; Avelina Laine (group home manager) [phone] has been identified by the patient as the family member/significant other with whom the patient will be residing, and identified as the person(s) who will aid the patient in the event of a mental health crisis (suicidal ideations/suicide attempt).  With written consent from the patient, the family member/significant other has been provided the following suicide prevention education, prior to the and/or following the discharge of the patient.  The suicide prevention education provided includes the following:  Suicide risk factors  Suicide prevention and interventions  National Suicide Hotline telephone number  [facility] assessment telephone number  [facility] Emergency Assistance [place] and/or Residential Mobile Crisis Unit telephone number  Request made of family/significant other to:  Remove weapons (e.g., guns, rifles, knives), all items previously/currently identified as safety concern.    Remove drugs/medications (over-the-counter, prescriptions, illicit drugs), all items previously/currently identified as a safety concern.  The family member/significant other verbalizes understanding of the suicide prevention education information provided.  The family member/significant other agrees to remove the items of safety concern listed above.  Carmell Austria T 11/10/2014, 4:18 PM

## 2014-11-11 NOTE — Progress Notes (Signed)
[facility]S H&P Discharge Summary faxed to [facility]

## 2014-11-15 ENCOUNTER — Encounter: Payer: Self-pay | Admitting: Emergency Medicine

## 2014-11-15 ENCOUNTER — Emergency Department
Admission: EM | Admit: 2014-11-15 | Discharge: 2014-11-16 | Disposition: A | Discharge status: Home / Self Care | Payer: Medicaid Other | Attending: Emergency Medicine | Admitting: Emergency Medicine

## 2014-11-15 DIAGNOSIS — E039 Hypothyroidism, unspecified: Secondary | ICD-10-CM | POA: Diagnosis present

## 2014-11-15 DIAGNOSIS — F3130 Bipolar disorder, current episode depressed, mild or moderate severity, unspecified: Secondary | ICD-10-CM | POA: Diagnosis not present

## 2014-11-15 DIAGNOSIS — Z87891 Personal history of nicotine dependence: Secondary | ICD-10-CM | POA: Diagnosis not present

## 2014-11-15 DIAGNOSIS — Z880 Allergy status to penicillin: Secondary | ICD-10-CM | POA: Insufficient documentation

## 2014-11-15 DIAGNOSIS — F70 Mild intellectual disabilities: Secondary | ICD-10-CM | POA: Diagnosis present

## 2014-11-15 DIAGNOSIS — E786 Lipoprotein deficiency: Secondary | ICD-10-CM

## 2014-11-15 DIAGNOSIS — Z8659 Personal history of other mental and behavioral disorders: Secondary | ICD-10-CM

## 2014-11-15 DIAGNOSIS — F319 Bipolar disorder, unspecified: Secondary | ICD-10-CM

## 2014-11-15 DIAGNOSIS — E780 Pure hypercholesterolemia: Secondary | ICD-10-CM | POA: Insufficient documentation

## 2014-11-15 DIAGNOSIS — R45851 Suicidal ideations: Secondary | ICD-10-CM | POA: Diagnosis present

## 2014-11-15 LAB — URINALYSIS COMPLETE WITH MICROSCOPIC (ARMC ONLY)
BACTERIA UA: NONE SEEN
Bilirubin Urine: NEGATIVE
Glucose, UA: NEGATIVE mg/dL
Hgb urine dipstick: NEGATIVE
Ketones, ur: NEGATIVE mg/dL
Leukocytes, UA: NEGATIVE
NITRITE: NEGATIVE
PROTEIN: NEGATIVE mg/dL
SQUAMOUS EPITHELIAL / LPF: NONE SEEN
Specific Gravity, Urine: 1.01 (ref 1.005–1.030)
WBC, UA: NONE SEEN WBC/hpf (ref 0–5)
pH: 6 (ref 5.0–8.0)

## 2014-11-15 LAB — CBC WITH DIFFERENTIAL/PLATELET
BASOS PCT: 0 %
Basophils Absolute: 0 10*3/uL (ref 0–0.1)
EOS ABS: 0.1 10*3/uL (ref 0–0.7)
Eosinophils Relative: 1 %
HEMATOCRIT: 38.5 % — AB (ref 40.0–52.0)
Hemoglobin: 13.5 g/dL (ref 13.0–18.0)
LYMPHS PCT: 51 %
Lymphs Abs: 2.9 10*3/uL (ref 1.0–3.6)
MCH: 32.9 pg (ref 26.0–34.0)
MCHC: 35.1 g/dL (ref 32.0–36.0)
MCV: 93.7 fL (ref 80.0–100.0)
Monocytes Absolute: 0.3 10*3/uL (ref 0.2–1.0)
Monocytes Relative: 6 %
Neutro Abs: 2.4 10*3/uL (ref 1.4–6.5)
Neutrophils Relative %: 42 %
PLATELETS: 166 10*3/uL (ref 150–440)
RBC: 4.1 MIL/uL — ABNORMAL LOW (ref 4.40–5.90)
RDW: 12.7 % (ref 11.5–14.5)
WBC: 5.7 10*3/uL (ref 3.8–10.6)

## 2014-11-15 LAB — URINE DRUG SCREEN, QUALITATIVE (ARMC ONLY)
AMPHETAMINES, UR SCREEN: NOT DETECTED
BARBITURATES, UR SCREEN: NOT DETECTED
BENZODIAZEPINE, UR SCRN: NOT DETECTED
Cannabinoid 50 Ng, Ur ~~LOC~~: NOT DETECTED
Cocaine Metabolite,Ur ~~LOC~~: NOT DETECTED
MDMA (Ecstasy)Ur Screen: NOT DETECTED
METHADONE SCREEN, URINE: NOT DETECTED
OPIATE, UR SCREEN: NOT DETECTED
Phencyclidine (PCP) Ur S: NOT DETECTED
Tricyclic, Ur Screen: NOT DETECTED

## 2014-11-15 LAB — COMPREHENSIVE METABOLIC PANEL
ALBUMIN: 4.4 g/dL (ref 3.5–5.0)
ALT: 20 U/L (ref 17–63)
AST: 28 U/L (ref 15–41)
Alkaline Phosphatase: 84 U/L (ref 38–126)
BUN: 9 mg/dL (ref 6–20)
CHLORIDE: 109 mmol/L (ref 101–111)
CO2: 26 mmol/L (ref 22–32)
Calcium: 9.7 mg/dL (ref 8.9–10.3)
Creatinine, Ser: 0.84 mg/dL (ref 0.61–1.24)
GFR calc non Af Amer: >60 mL/min (ref >60)
GLUCOSE: 128 mg/dL — AB (ref 65–99)
Potassium: 3.4 mmol/L — ABNORMAL LOW (ref 3.5–5.1)

## 2014-11-15 LAB — ETHANOL: Alcohol, Ethyl (B): <5 mg/dL (ref <5)

## 2014-11-15 MED ORDER — ADULT MULTIVITAMIN W/MINERALS CH
1.0000 | ORAL_TABLET | Freq: Every day | ORAL | Status: DC
  Administered 2014-11-16: 1 via ORAL
  Filled 2014-11-15: qty 1

## 2014-11-15 MED ORDER — PANTOPRAZOLE SODIUM 40 MG PO TBEC
40.0000 mg | DELAYED_RELEASE_TABLET | Freq: Every day | ORAL | Status: DC
  Administered 2014-11-16: 40 mg via ORAL

## 2014-11-15 MED ORDER — TRAZODONE HCL 50 MG PO TABS
150.0000 mg | ORAL_TABLET | Freq: Every day | ORAL | Status: DC
  Administered 2014-11-16: 150 mg via ORAL
  Filled 2014-11-15: qty 1

## 2014-11-15 MED ORDER — OLANZAPINE 5 MG PO TABS
15.0000 mg | ORAL_TABLET | Freq: Every day | ORAL | Status: DC
  Administered 2014-11-16: 15 mg via ORAL
  Filled 2014-11-15: qty 1

## 2014-11-15 MED ORDER — LEVOTHYROXINE SODIUM 75 MCG PO TABS
75.0000 ug | ORAL_TABLET | Freq: Every day | ORAL | Status: DC
  Administered 2014-11-16: 75 ug via ORAL
  Filled 2014-11-15: qty 1

## 2014-11-15 MED ORDER — VITAMIN D (ERGOCALCIFEROL) 1.25 MG (50000 UNIT) PO CAPS
50000.0000 [IU] | ORAL_CAPSULE | ORAL | Status: DC
  Administered 2014-11-16: 50000 [IU] via ORAL
  Filled 2014-11-15: qty 1

## 2014-11-15 NOTE — ED Notes (Signed)
Pt presents to ER via [employer], IVC. Pt states he doesn't like his group home and put a belt aorund his neck to try and kill himself. pt states "If I go back to that group home, I will do it again."

## 2014-11-15 NOTE — ED Notes (Signed)

## 2014-11-15 NOTE — ED Notes (Signed)
BEHAVIORAL HEALTH ROUNDING  Patient sleeping: No.  Patient alert and oriented: yes  Behavior appropriate: Yes. ; If no, describe:  Nutrition and fluids offered: Yes  Toileting and hygiene offered: Yes  Sitter present: not applicable  Law enforcement present: Yes ODS  

## 2014-11-15 NOTE — ED Notes (Signed)
Pt unsure of medications taken

## 2014-11-15 NOTE — ED Notes (Signed)
Report to [place], rn. In preparation to move pt to [facility].

## 2014-11-15 NOTE — ED Notes (Signed)
Calvin with psych intake speaking with pt.

## 2014-11-15 NOTE — ED Notes (Signed)
Pt eating meal tray 

## 2014-11-15 NOTE — ED Provider Notes (Signed)
[facility] Emergency Department Provider Note  ____________________________________________  Time seen: Approximately 9:24 PM  I have reviewed the triage vital signs and the nursing notes.   HISTORY  Chief Complaint Suicidal    HPI Daniel Holmes is a 44 y.o. male history of bipolar disorder who presents today stating that she goes back to his group home he will choke himself and plans to hang himself with a belt.  He is brought on IVC by the police. In the ER he denies wine hurt others, but states he will kill himself if he has to go to his group home because he does not like it there. They do not treat him away he wishes to be.  Denies any medical concerns. Denies fevers or chills. Denies having any recent illness.   Past Medical History  Diagnosis Date  . Psychosis     See Dr Laverta [facility] [name]    Patient Active Problem List   Diagnosis Date Noted  . Bipolar 1 disorder 11/08/2014  . 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 Bipolar disorder with depression 11/03/2014  . Arthritis 11/03/2014  . 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 History of ADHD 11/03/2014  . Allergic rhinitis 11/05/2012  . Hypothyroidism 03/28/2012    History reviewed. No pertinent past surgical history.  Current Outpatient Rx  Name  Route  Sig  Dispense  Refill  . cloNIDine (CATAPRES) 0.1 MG tablet   Oral   Take 1 tablet (0.1 mg total) by mouth 2 (two) times daily.   60 tablet   0   . levothyroxine (SYNTHROID, LEVOTHROID) 75 MCG tablet   Oral   Take 1 tablet (75 mcg total) by mouth daily.   30 tablet   0   . lithium carbonate (ESKALITH) 450 MG CR tablet   Oral   Take 1 tablet (450 mg total) by mouth every 12 (twelve) hours.   60 tablet   0   . loratadine (CLARITIN) 10 MG tablet   Oral   Take 1 tablet (10 mg total) by mouth daily as needed for allergies.   30 tablet   0   . Multiple Vitamins-Minerals (MULTIVITAMIN ADULTS 50+)  TABS   Oral   Take 1 tablet by mouth daily.   30 tablet   0   . OLANZapine (ZYPREXA) 20 MG tablet   Oral   Take 1.5 tablets (30 mg total) by mouth at bedtime. Take 1 and 1/2 tablets by mouth at bedtime.   45 tablet   0   . pantoprazole (PROTONIX) 40 MG tablet   Oral   Take 1 tablet (40 mg total) by mouth daily.   30 tablet   0   . traZODone (DESYREL) 150 MG tablet   Oral   Take 1 tablet (150 mg total) by mouth at bedtime.   30 tablet   0   . Vitamin D, Ergocalciferol, (DRISDOL) 50000 UNITS CAPS capsule   Oral   Take 1 capsule (50,000 Units total) by mouth every 14 (fourteen) days.   2 capsule   0     Allergies Penicillins  History reviewed. No pertinent family history.  Social History History  Substance Use Topics  . Smoking status: Former Smoker -- 1.00 packs/day    Types: Cigarettes  . Smokeless tobacco: Not on file  . Alcohol Use: No    Review of Systems Constitutional: No fever/chills Eyes: No visual changes. ENT: No sore throat. Cardiovascular: Denies chest pain. Respiratory: Denies shortness  of breath. Gastrointestinal: No abdominal pain.  No nausea, no vomiting.  No diarrhea.  No constipation. Genitourinary: Negative for dysuria. Musculoskeletal: Negative for back pain. Skin: Negative for rash. Neurological: Negative for headaches, focal weakness or numbness.  Patient does reports he hears the devil talk to him. They'll sometimes say to kill himself.  10-point ROS otherwise negative.  ____________________________________________   PHYSICAL EXAM:  VITAL SIGNS: ED Triage Vitals  Enc Vitals Group     BP 11/15/14 2009 137/73 mmHg     Pulse Rate 11/15/14 2009 91     Resp 11/15/14 2009 20     Temp 11/15/14 2009 98.1 F (36.7 C)     Temp src --      SpO2 11/15/14 2009 96 %     Weight 11/15/14 2009 175 lb (79.379 kg)     Height 11/15/14 2009 5\' 7"  (1.702 m)     Head Cir --      Peak Flow --      Pain Score --      Pain Loc --      Pain  Edu? --      Excl. in [place]? --     Constitutional: Alert and oriented. Well appearing and in no acute distress. Eyes: Conjunctivae are normal. PERRL. EOMI. Head: Atraumatic. Nose: No congestion/rhinnorhea. Mouth/Throat: Mucous membranes are moist.  Oropharynx non-erythematous. Neck: No stridor.   Cardiovascular: Normal rate, regular rhythm. Grossly normal heart sounds.  Good peripheral circulation. Respiratory: Normal respiratory effort.  No retractions. Lungs CTAB. Gastrointestinal: Soft and nontender. No distention. No abdominal bruits. No CVA tenderness. Musculoskeletal: No lower extremity tenderness nor edema.  No joint effusions. Neurologic:  Patient is very thick, slightly slurred speech. He states this is normal. No gross focal neurologic deficits are appreciated. . Skin:  Skin is warm, dry and intact. No rash noted. Psychiatric: Mood and affect are very flat. He reports that he will kill himself if he goes back  ____________________________________________   LABS (all labs ordered are listed, but only abnormal results are displayed)  Labs Reviewed  CBC WITH DIFFERENTIAL/PLATELET - Abnormal; Notable for the following:    RBC 4.10 (*)    HCT 38.5 (*)    All other components within normal limits  COMPREHENSIVE METABOLIC PANEL - Abnormal; Notable for the following:    Potassium 3.4 (*)    Glucose, Bld 128 (*)    Total Bilirubin <0.1 (*)    All other components within normal limits  URINALYSIS COMPLETEWITH MICROSCOPIC ([facility] ONLY) - Abnormal; Notable for the following:    Color, Urine YELLOW (*)    APPearance CLEAR (*)    All other components within normal limits  ETHANOL  URINE DRUG SCREEN, QUALITATIVE ([facility] ONLY)   ____________________________________________  EKG   ____________________________________________  RADIOLOGY   ____________________________________________   PROCEDURES  Procedure(s) performed: None  Critical Care performed:  No  ____________________________________________   INITIAL IMPRESSION / ASSESSMENT AND PLAN / ED COURSE  Pertinent labs & imaging results that were available during my care of the patient were reviewed by me and considered in my medical decision making (see chart for details).  Patient denies acute medical. Labs didn't indicate severe lipemia. Otherwise, no acute abnormalities. Did discuss with the patient and recommend he follow up with his primary care doctor for further testing. In addition, we will maintain the patient on IVC and obtain psychiatric consultation because of his desire to hurt himself. He denies active attempts, but does have a plan to strangle  self.  Further care and disposition assigned to Dr. Charlesetta Ivory ____________________________________________   FINAL CLINICAL IMPRESSION(S) / ED DIAGNOSES  Final diagnoses:  Bipolar disorder with depression      Delman Kitten, MD 11/15/14 2342

## 2014-11-16 DIAGNOSIS — F70 Mild intellectual disabilities: Secondary | ICD-10-CM

## 2014-11-16 LAB — LITHIUM LEVEL

## 2014-11-16 LAB — TSH: TSH: 3.642 u[IU]/mL (ref 0.350–4.500)

## 2014-11-16 MED ORDER — ACETAMINOPHEN 325 MG PO TABS
650.0000 mg | ORAL_TABLET | Freq: Once | ORAL | Status: AC
  Administered 2014-11-16: 650 mg via ORAL

## 2014-11-16 MED ORDER — LITHIUM CARBONATE ER 450 MG PO TBCR: 450.0000 mg | EXTENDED_RELEASE_TABLET | Freq: Two times a day (BID) | ORAL | Status: DC

## 2014-11-16 MED ORDER — ACETAMINOPHEN 325 MG PO TABS
ORAL_TABLET | ORAL | Status: AC
  Filled 2014-11-16: qty 2

## 2014-11-16 MED ORDER — PANTOPRAZOLE SODIUM 40 MG PO TBEC
DELAYED_RELEASE_TABLET | ORAL | Status: AC
  Filled 2014-11-16: qty 1

## 2014-11-16 NOTE — ED Notes (Signed)
BEHAVIORAL HEALTH ROUNDING Patient sleeping: Yes.   Patient alert and oriented: not applicable Behavior appropriate: Yes.  ; If no, describe:   Nutrition and fluids offered: No Toileting and hygiene offered: No Sitter present: no Law enforcement present: Yes  and ODS    

## 2014-11-16 NOTE — ED Notes (Signed)
BEHAVIORAL HEALTH ROUNDING Patient sleeping: No. Patient alert and oriented: yes Behavior appropriate: Yes.  ; If no, describe:  Nutrition and fluids offered: Yes  Toileting and hygiene offered: Yes  Sitter present: no Law enforcement present: Yes  

## 2014-11-16 NOTE — Consult Note (Signed)
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 Bipolar 1 disorder [F31.9] 11/08/2014  . 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 Bipolar disorder with depression [F31.30] 11/03/2014  . Arthritis [M19.90] 11/03/2014  . 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 History of ADHD [Z86.59] 11/03/2014  . Allergic rhinitis [J30.9] 11/05/2012  . Hypothyroidism [E03.9] 03/28/2012    Total Time spent with patient: 45 minutes  Subjective:   Daniel Holmes is a 44 y.o. male patient admitted with "if I go back I'll do it again". Also "they hollered at me and cussed at me".  HPI:  Patient was brought here from his group home because he was threatening that he was going to try to choke himself if he had to go back to his group home. Patient is very limited intellectually and in his speech capacity but he made it clear to me that some staff member at the group home had hollered at him and cussed at him. He says that this happened because he had asked for some Tylenol. Patient says that he is now promising that if he has to go back to the group home he will take his belt and try to choke himself again. He denies any particular depression. He denies any particular recent trauma or other complaints about the group home. He says he's been compliant with his medicine and denies any substance abuse. He says that he is hearing voices but he shows no signs of responding to internal stimuli.  Past psychiatric history: Patient has a history of mild to moderate mental retardation. Has had psychiatric hospitalizations in the past including one here recently and  has had several visits to the emergency room. Tends to act out when he is frustrated at the group home. Has acted out by putting a belt around his neck in the past. Medications include lithium and olanzapine. Lithium level this time is 0 raising the question of whether he had been compliant with it recently.  Social history: Father is his guardian apparently. Patient is unclear as to why he can't go back and live with his father. It sounds like he's had trouble adapting to group home life.  Medical history: Hypothyroidism, history of arthritis, he has no teeth, mild to moderate chronic intellectual disability history of ADHD.  Family history: Unknown  Substance abuse history: Denies any drug or alcohol use now or in the past. HPI Elements:   Quality:  Threats to hurt himself if he has to go back to his group home. Frustration.. Severity:  Mild to moderate and unlikely to be life-threatening. Timing:  Recent just after a run in at the group home. Duration:  Chronic problem. Context:  Claims that they were cussing at him at the group home.  Past Medical History:  Past Medical History  Diagnosis Date  . Psychosis     See Dr Laverta Baltimore [facility] [name]   History reviewed. No pertinent past surgical history. Family History: History reviewed. No pertinent family history. Social History:  History  Alcohol Use No     History  Drug Use No    History   Social History  . Marital Status: Single  Spouse Name: N/A  . Number of Children: N/A  . Years of Education: N/A   Social History Main Topics  . Smoking status: Former Smoker -- 1.00 packs/day    Types: Cigarettes  . Smokeless tobacco: Not on file  . Alcohol Use: No  . Drug Use: No  . Sexual Activity: No   Other Topics Concern  . None   Social History Narrative   Lives in group home - [facility] in [place]   Additional Social History:    Pain Medications: None Reported Prescriptions: None  Reported Over the Counter: None Reported History of alcohol / drug use?: No history of alcohol / drug abuse Longest period of sobriety (when/how long): None Reported (None Reported) Negative Consequences of Use:  (None Reported) Withdrawal Symptoms:  (None Reported)                     Allergies:   Allergies  Allergen Reactions  . Penicillins Swelling    Labs:  Results for orders placed or performed during the hospital encounter of 11/15/14 (from the past 48 hour(s))  CBC WITH DIFFERENTIAL     Status: Abnormal   Collection Time: 11/15/14  8:23 PM  Result Value Ref Range   WBC 5.7 3.8 - 10.6 K/uL   RBC 4.10 (L) 4.40 - 5.90 MIL/uL   Hemoglobin 13.5 13.0 - 18.0 g/dL   HCT 38.5 (L) 40.0 - 52.0 %   MCV 93.7 80.0 - 100.0 fL   MCH 32.9 26.0 - 34.0 pg   MCHC 35.1 32.0 - 36.0 g/dL   RDW 12.7 11.5 - 14.5 %   Platelets 166 150 - 440 K/uL   Neutrophils Relative % 42 %   Neutro Abs 2.4 1.4 - 6.5 K/uL   Lymphocytes Relative 51 %   Lymphs Abs 2.9 1.0 - 3.6 K/uL   Monocytes Relative 6 %   Monocytes Absolute 0.3 0.2 - 1.0 K/uL   Eosinophils Relative 1 %   Eosinophils Absolute 0.1 0 - 0.7 K/uL   Basophils Relative 0 %   Basophils Absolute 0.0 0 - 0.1 K/uL  Comprehensive metabolic panel     Status: Abnormal   Collection Time: 11/15/14  8:23 PM  Result Value Ref Range   Sodium SEE COMMENTS 135 - 145 mmol/L    Comment: UNABLE TO REPORT DUE TO SEVERE LIPEMIA   Potassium 3.4 (L) 3.5 - 5.1 mmol/L   Chloride 109 101 - 111 mmol/L   CO2 26 22 - 32 mmol/L   Glucose, Bld 128 (H) 65 - 99 mg/dL   BUN 9 6 - 20 mg/dL   Creatinine, Ser 0.84 0.61 - 1.24 mg/dL   Calcium 9.7 8.9 - 10.3 mg/dL   Total Protein SEE COMMENTS 6.5 - 8.1 g/dL    Comment: UNABLE TO REPORT DUE TO SEVERE LIPEMIA   Albumin 4.4 3.5 - 5.0 g/dL   AST 28 15 - 41 U/L   ALT 20 17 - 63 U/L   Alkaline Phosphatase 84 38 - 126 U/L   Total Bilirubin <0.1 (L) 0.3 - 1.2 mg/dL   GFR calc non Af Amer >60 >60 mL/min   GFR calc Af  Amer >60 >60 mL/min    Comment: (NOTE) The eGFR has been calculated using the CKD EPI equation. This calculation has not been validated in all clinical situations. eGFR's persistently <60 mL/min signify possible Chronic Kidney Disease.    Anion gap SEE COMMENTS 5 - 15  Comment: UNABLE TO REPORT DUE TO SEVERE LIPEMIA  Ethanol     Status: None   Collection Time: 11/15/14  8:23 PM  Result Value Ref Range   Alcohol, Ethyl (B) <5 <5 mg/dL    Comment:        LOWEST DETECTABLE LIMIT FOR SERUM ALCOHOL IS 5 mg/dL FOR MEDICAL PURPOSES ONLY   TSH     Status: None   Collection Time: 11/15/14  8:23 PM  Result Value Ref Range   TSH 3.642 0.350 - 4.500 uIU/mL  Lithium level     Status: Abnormal   Collection Time: 11/15/14  8:23 PM  Result Value Ref Range   Lithium Lvl <0.06 (L) 0.60 - 1.20 mmol/L  Urine Drug Screen, Qualitative ([facility] only)     Status: None   Collection Time: 11/15/14  8:47 PM  Result Value Ref Range   Tricyclic, Ur Screen NONE DETECTED NONE DETECTED   Amphetamines, Ur Screen NONE DETECTED NONE DETECTED   MDMA (Ecstasy)Ur Screen NONE DETECTED NONE DETECTED   Cocaine Metabolite,Ur [place] NONE DETECTED NONE DETECTED   Opiate, Ur Screen NONE DETECTED NONE DETECTED   Phencyclidine (PCP) Ur S NONE DETECTED NONE DETECTED   Cannabinoid 50 Ng, Ur [place] NONE DETECTED NONE DETECTED   Barbiturates, Ur Screen NONE DETECTED NONE DETECTED   Benzodiazepine, Ur Scrn NONE DETECTED NONE DETECTED   Methadone Scn, Ur NONE DETECTED NONE DETECTED    Comment: (NOTE) 169  Tricyclics, urine               Cutoff 1000 ng/mL 200  Amphetamines, urine             Cutoff 1000 ng/mL 300  MDMA (Ecstasy), urine           Cutoff 500 ng/mL 400  Cocaine Metabolite, urine       Cutoff 300 ng/mL 500  Opiate, urine                   Cutoff 300 ng/mL 600  Phencyclidine (PCP), urine      Cutoff 25 ng/mL 700  Cannabinoid, urine              Cutoff 50 ng/mL 800  Barbiturates, urine             Cutoff 200 ng/mL 900   Benzodiazepine, urine           Cutoff 200 ng/mL 1000 Methadone, urine                Cutoff 300 ng/mL 1100 1200 The urine drug screen provides only a preliminary, unconfirmed 1300 analytical test result and should not be used for non-medical 1400 purposes. Clinical consideration and professional judgment should 1500 be applied to any positive drug screen result due to possible 1600 interfering substances. A more specific alternate chemical method 1700 must be used in order to obtain a confirmed analytical result.  1800 Gas chromato graphy / mass spectrometry (GC/MS) is the preferred 1900 confirmatory method.   Urinalysis complete, with microscopic ([facility] only)     Status: Abnormal   Collection Time: 11/15/14  8:47 PM  Result Value Ref Range   Color, Urine YELLOW (A) YELLOW   APPearance CLEAR (A) CLEAR   Glucose, UA NEGATIVE NEGATIVE mg/dL   Bilirubin Urine NEGATIVE NEGATIVE   Ketones, ur NEGATIVE NEGATIVE mg/dL   Specific Gravity, Urine 1.010 1.005 - 1.030   Hgb urine dipstick NEGATIVE NEGATIVE   pH 6.0 5.0 - 8.0   Protein, ur  NEGATIVE NEGATIVE mg/dL   Nitrite NEGATIVE NEGATIVE   Leukocytes, UA NEGATIVE NEGATIVE   RBC / HPF 0-5 0 - 5 RBC/hpf   WBC, UA NONE SEEN 0 - 5 WBC/hpf   Bacteria, UA NONE SEEN NONE SEEN   Squamous Epithelial / LPF NONE SEEN NONE SEEN   Mucous PRESENT     Vitals: Blood pressure 109/74, pulse 81, temperature 98.4 F (36.9 C), temperature source Oral, resp. rate 20, height _0  (1.702 m), weight 79.379 kg (175 lb), SpO2 100 %.  Risk to Self: Suicidal Ideation: Yes-Currently Present Suicidal Intent:  (to put a belt around my neck) Is patient at risk for suicide?: Yes Suicidal Plan?: Yes-Currently Present Specify Current Suicidal Plan:  (to put a belt around my neck) Access to Means: Yes Specify Access to Suicidal Means: to use a belt What has been your use of drugs/alcohol within the last 12 months?: none How many times?: 0 Other Self Harm Risks:  0 Triggers for Past Attempts: Other (Comment) Intentional Self Injurious Behavior: None Comment - Self Injurious Behavior: none Risk to Others: Homicidal Ideation: No Thoughts of Harm to Others: No Current Homicidal Intent: No Current Homicidal Plan: No Access to Homicidal Means: No Identified Victim:  (none) History of harm to others?: No Assessment of Violence: On admission Violent Behavior Description: none Does patient have access to weapons?: No Criminal Charges Pending?: No Does patient have a court date: No Prior Inpatient Therapy: Prior Inpatient Therapy: Yes Prior Therapy Dates: 10/2014 Prior Therapy Facilty/Provider(s): [facility] Reason for Treatment: Depression and Suicidal Gestures Prior Outpatient Therapy: Prior Outpatient Therapy: No Prior Therapy Dates: n/a Prior Therapy Facilty/Provider(s): n/a Reason for Treatment: bipolar Does patient have an ACCT team?: No Does patient have Intensive In-House Services?  : No Does patient have [facility] services? : No Does patient have [facility] services?: No  Current Facility-Administered Medications  Medication Dose Route Frequency Provider Last Rate Last Dose  . levothyroxine (SYNTHROID, LEVOTHROID) tablet 75 mcg  75 mcg Oral QAC breakfast Delman Kitten, MD   75 mcg at 11/16/14 0745  . lithium carbonate (ESKALITH) CR tablet 450 mg  450 mg Oral Q12H Gonzella Lex, MD      . multivitamin with minerals tablet 1 tablet  1 tablet Oral Daily Delman Kitten, MD   1 tablet at 11/16/14 0946  . OLANZapine (ZYPREXA) tablet 15 mg  15 mg Oral QHS Delman Kitten, MD   15 mg at 11/16/14 0013  . pantoprazole (PROTONIX) EC tablet 40 mg  40 mg Oral Daily Delman Kitten, MD   40 mg at 11/16/14 0947  . traZODone (DESYREL) tablet 150 mg  150 mg Oral QHS Delman Kitten, MD   150 mg at 11/16/14 0014  . Vitamin D (Ergocalciferol) (DRISDOL) capsule 50,000 Units  50,000 Units Oral Q14 Days Delman Kitten, MD   50,000 Units at 11/16/14 0116   Current Outpatient Prescriptions   Medication Sig Dispense Refill  . cloNIDine (CATAPRES) 0.1 MG tablet Take 1 tablet (0.1 mg total) by mouth 2 (two) times daily. 60 tablet 0  . levothyroxine (SYNTHROID, LEVOTHROID) 75 MCG tablet Take 1 tablet (75 mcg total) by mouth daily. 30 tablet 0  . lithium carbonate (ESKALITH) 450 MG CR tablet Take 1 tablet (450 mg total) by mouth every 12 (twelve) hours. 60 tablet 0  . loratadine (CLARITIN) 10 MG tablet Take 1 tablet (10 mg total) by mouth daily as needed for allergies. 30 tablet 0  . Multiple Vitamins-Minerals (MULTIVITAMIN ADULTS 50+) TABS  Take 1 tablet by mouth daily. 30 tablet 0  . OLANZapine (ZYPREXA) 20 MG tablet Take 1.5 tablets (30 mg total) by mouth at bedtime. Take 1 and 1/2 tablets by mouth at bedtime. 45 tablet 0  . pantoprazole (PROTONIX) 40 MG tablet Take 1 tablet (40 mg total) by mouth daily. 30 tablet 0  . traZODone (DESYREL) 150 MG tablet Take 1 tablet (150 mg total) by mouth at bedtime. 30 tablet 0  . Vitamin D, Ergocalciferol, (DRISDOL) 50000 UNITS CAPS capsule Take 1 capsule (50,000 Units total) by mouth every 14 (fourteen) days. 2 capsule 0    Musculoskeletal: Strength & Muscle Tone: within normal limits Gait & Station: normal Patient leans: N/A  Psychiatric Specialty Exam: Physical Exam  Constitutional: He appears well-developed and well-nourished.  HENT:  Head: Normocephalic and atraumatic.  Mouth/Throat: Abnormal dentition.    Eyes: Conjunctivae are normal. Pupils are equal, round, and reactive to light.  Neck: Normal range of motion.  Cardiovascular: Normal heart sounds.   Respiratory: Effort normal.  GI: Soft.  Musculoskeletal: Normal range of motion.  Neurological: He is alert.  Skin: Skin is warm and dry.  Psychiatric: His behavior is normal. Thought content normal. His affect is blunt. His speech is delayed and slurred. Cognition and memory are impaired. He expresses impulsivity and inappropriate judgment.  Patient presents as being calm and  appropriate. Good eye contact. Normal interaction for the most part. His speech tends to be repetitive and extremely simple and content. Affect is constricted but not sad and not angry. Does not appear to be responding to internal stimuli.    Review of Systems  Constitutional: Negative.   HENT: Negative.   Eyes: Negative.   Respiratory: Negative.   Cardiovascular: Negative.   Gastrointestinal: Negative.   Musculoskeletal: Negative.   Skin: Negative.   Neurological: Negative.   Psychiatric/Behavioral: Positive for depression. Negative for suicidal ideas, hallucinations and substance abuse. The patient is nervous/anxious.     Blood pressure 109/74, pulse 81, temperature 98.4 F (36.9 C), temperature source Oral, resp. rate 20, height _0  (1.702 m), weight 79.379 kg (175 lb), SpO2 100 %.Body mass index is 27.4 kg/(m^2).  General Appearance: Disheveled  Eye Contact::  Good  Speech:  Garbled and Slurred  Volume:  Normal  Mood:  Anxious  Affect:  Congruent  Thought Process:  Circumstantial and Loose  Orientation:  Full (Time, Place, and Person)  Thought Content:  Negative  Suicidal Thoughts:  No  Homicidal Thoughts:  No  Memory:  Immediate;   Fair Recent;   Fair Remote;   Poor  Judgement:  Impaired  Insight:  Shallow  Psychomotor Activity:  Normal  Concentration:  Fair  Recall:  [employer] of Knowledge:Poor  Language: Poor  Akathisia:  No  Handed:  Right  AIMS (if indicated):     Assets:  Financial Resources/Insurance Housing Social Support  ADL's:  Intact  Cognition: Impaired,  Moderate  Sleep:      Medical Decision Making: Established Problem, Stable/Improving (1), Review of Psycho-Social Stressors (1), Review or order clinical lab tests (1), Review of Medication Regimen & Side Effects (2) and Review of New Medication or Change in Dosage (2)  Treatment Plan Summary: Medication management and Plan After speaking with me briefly I informed the patient that we would not  be able to find him a new group home as that is simply not what we are going to do here in the emergency room. As soon as I told him this  the patient stated that he would be glad to go back to the group home. He said that he would not try to hurt himself or kill himself. Sincere with this as he had previously. He has not shown any dangerous aggressive or violent behavior here in the emergency room. Patient has probably moderate severity chronic intellectual impairment with poor coping skills. No sign that he is actually sad or depressed. Has had chronic problems getting along at the group home. At this point he appears to be at his baseline and is unlikely to benefit from further hospitalization. I went ahead and continue the lithium that he was on as of 4 days prior to his coming back here. Patient can be returned back to his group home continued follow-up treatment as previously.  Plan:  Patient does not meet criteria for psychiatric inpatient admission. Supportive therapy provided about ongoing stressors. Disposition: Discontinue commitment paperwork refer to outpatient treatment  Alethia Berthold 11/16/2014 3:19 PM

## 2014-11-16 NOTE — Discharge Instructions (Signed)
Bipolar Disorder Bipolar disorder is a mental illness. The term bipolar disorder actually is used to describe a group of disorders that all share varying degrees of emotional highs and lows that can interfere with daily functioning, such as work, school, or relationships. Bipolar disorder also can lead to drug abuse, hospitalization, and suicide. The emotional highs of bipolar disorder are periods of elation or irritability and high energy. These highs can range from a mild form (hypomania) to a severe form (mania). People experiencing episodes of hypomania may appear energetic, excitable, and highly productive. People experiencing mania may behave impulsively or erratically. They often make poor decisions. They may have difficulty sleeping. The most severe episodes of mania can involve having very distorted beliefs or perceptions about the world and seeing or hearing things that are not real (psychotic delusions and hallucinations).  The emotional lows of bipolar disorder (depression) also can range from mild to severe. Severe episodes of bipolar depression can involve psychotic delusions and hallucinations. Sometimes people with bipolar disorder experience a state of mixed mood. Symptoms of hypomania or mania and depression are both present during this mixed-mood episode. SIGNS AND SYMPTOMS There are signs and symptoms of the episodes of hypomania and mania as well as the episodes of depression. The signs and symptoms of hypomania and mania are similar but vary in severity. They include:  Inflated self-esteem or feeling of increased self-confidence.  Decreased need for sleep.  Unusual talkativeness (rapid or pressured speech) or the feeling of a need to keep talking.  Sensation of racing thoughts or constant talking, with quick shifts between topics that may or may not be related (flight of ideas).  Decreased ability to focus or concentrate.  Increased purposeful activity, such as work, studies,  or social activity, or nonproductive activity, such as pacing, squirming and fidgeting, or finger and toe tapping.  Impulsive behavior and use of poor judgment, resulting in high-risk activities, such as having unprotected sex or spending excessive amounts of money. Signs and symptoms of depression include the following:   Feelings of sadness, hopelessness, or helplessness.  Frequent or uncontrollable episodes of crying.  Lack of feeling anything or caring about anything.  Difficulty sleeping or sleeping too much.  Inability to enjoy the things you used to enjoy.   Desire to be alone all the time.   Feelings of guilt or worthlessness.  Lack of energy or motivation.   Difficulty concentrating, remembering, or making decisions.  Change in appetite or weight beyond normal fluctuations.  Thoughts of death or the desire to harm yourself. DIAGNOSIS  Bipolar disorder is diagnosed through an assessment by your caregiver. Your caregiver will ask questions about your emotional episodes. There are two main types of bipolar disorder. People with type I bipolar disorder have manic episodes with or without depressive episodes. People with type II bipolar disorder have hypomanic episodes and major depressive episodes, which are more serious than mild depression. The type of bipolar disorder you have can make an important difference in how your illness is monitored and treated. Your caregiver may ask questions about your medical history and use of alcohol or drugs, including prescription medication. Certain medical conditions and substances also can cause emotional highs and lows that resemble bipolar disorder (secondary bipolar disorder).  TREATMENT  Bipolar disorder is a long-term illness. It is best controlled with continuous treatment rather than treatment only when symptoms occur. The following treatments can be prescribed for bipolar disorders:  Medication--Medication can be prescribed by  a doctor that  is an expert in treating mental disorders (psychiatrists). Medications called mood stabilizers are usually prescribed to help control the illness. Other medications are sometimes added if symptoms of mania, depression, or psychotic delusions and hallucinations occur despite the use of a mood stabilizer.  Talk therapy--Some forms of talk therapy are helpful in providing support, education, and guidance. A combination of medication and talk therapy is best for managing the disorder over time. A procedure in which electricity is applied to your brain through your scalp (electroconvulsive therapy) is used in cases of severe mania when medication and talk therapy do not work or work too slowly. Document Released: 08/21/2000 Document Revised: 09/09/2012 Document Reviewed: 06/10/2012 [facility] Patient Information 2015 [place], [place]. This information is not intended to replace advice given to you by your health care provider. Make sure you discuss any questions you have with your health care provider.  Dyslipidemia Dyslipidemia is an imbalance of the lipids in your blood. Lipids are waxy, fat-like proteins that your body needs in small amounts. Dyslipidemia often involves the lipids cholesterol or triglycerides. Common forms of dyslipidemia are:  High levels of bad cholesterol (LDL cholesterol). LDL cholesterol is the type of cholesterol that causes heart disease.  Low levels of good cholesterol (HDL cholesterol). HDL cholesterol is the type of cholesterol that helps protect against heart disease.  High levels of triglycerides. Triglycerides are a fatty substance in the blood linked to a buildup of plaque on your arteries. RISK FACTORS  Increased age.  Having a family history of high cholesterol.  Certain medicines, including birth control pills, diuretics, beta-blockers, and some medicines for depression.  Smoking.  Eating a high-fat diet.  Being overweight.  Medical conditions  such as diabetes, polycystic ovary syndrome, pregnancy, kidney disease, and hypothyroidism.  Lack of regular exercise. SIGNS AND SYMPTOMS There are no signs or symptoms with dyslipidemia.  DIAGNOSIS  A simple blood test called a fasting blood test can be done to determine your level of:  Total cholesterol. This is the combined number of LDL cholesterol and HDL cholesterol. A healthy number is lower than 200.  LDL cholesterol. The goal number for LDL cholesterol is different for each person depending on risk factors. Ask your health care provider what your LDL cholesterol number should be.  HDL cholesterol. A healthy level of HDL cholesterol is 60 or higher. A number lower than 40 for men or 50 for women is a danger sign.  Triglycerides. A healthy triglyceride number is less than 150. TREATMENT  Dyslipidemia is a treatable condition. Your health care provider will advise you on what type of treatment is best based on your age, your test results, and current guidelines. Treatment may include:   Dietary changes. A dietitian can help you create a meal plan. You may need to:  Eat more foods that contain omega-3s, such as salmon and other fish.  Replace saturated fats and trans fats in your diet with healthy fats such as nuts, seeds, avocados, olive oil, and canola oil.  Regular exercise. This can help lower your LDL cholesterol, raise your HDL cholesterol, and help with weight management. Check with your health care provider before beginning an exercise program. Most people should participate in 30 minutes of brisk exercise 5 days a week.  Quitting smoking.  Medicines to lower LDL cholesterol and triglycerides. Your health care provider will monitor your lipid levels with regular blood tests. HOME CARE INSTRUCTIONS  Eat a healthy diet. Follow any diet instructions if they were given to you  by your health care provider.  Maintain a healthy weight.  Exercise regularly based on the  recommendations of your health care provider.  Do not use any tobacco products, including cigarettes, chewing tobacco, or electronic cigarettes.  Take medicines only as directed by your health care provider.  Keep all follow-up visits as directed by your health care provider. SEEK MEDICAL CARE IF: You are having possible side effects from your medicines. Document Released: 05/20/2013 Document Revised: 09/29/2013 Document Reviewed: 05/20/2013 [facility] Patient Information 2015 [place], [place]. This information is not intended to replace advice given to you by your health care provider. Make sure you discuss any questions you have with your health care provider.  Bipolar Disorder Bipolar disorder is a mental illness. The term bipolar disorder actually is used to describe a group of disorders that all share varying degrees of emotional highs and lows that can interfere with daily functioning, such as work, school, or relationships. Bipolar disorder also can lead to drug abuse, hospitalization, and suicide. The emotional highs of bipolar disorder are periods of elation or irritability and high energy. These highs can range from a mild form (hypomania) to a severe form (mania). People experiencing episodes of hypomania may appear energetic, excitable, and highly productive. People experiencing mania may behave impulsively or erratically. They often make poor decisions. They may have difficulty sleeping. The most severe episodes of mania can involve having very distorted beliefs or perceptions about the world and seeing or hearing things that are not real (psychotic delusions and hallucinations).  The emotional lows of bipolar disorder (depression) also can range from mild to severe. Severe episodes of bipolar depression can involve psychotic delusions and hallucinations. Sometimes people with bipolar disorder experience a state of mixed mood. Symptoms of hypomania or mania and depression are both present  during this mixed-mood episode. SIGNS AND SYMPTOMS There are signs and symptoms of the episodes of hypomania and mania as well as the episodes of depression. The signs and symptoms of hypomania and mania are similar but vary in severity. They include:  Inflated self-esteem or feeling of increased self-confidence.  Decreased need for sleep.  Unusual talkativeness (rapid or pressured speech) or the feeling of a need to keep talking.  Sensation of racing thoughts or constant talking, with quick shifts between topics that may or may not be related (flight of ideas).  Decreased ability to focus or concentrate.  Increased purposeful activity, such as work, studies, or social activity, or nonproductive activity, such as pacing, squirming and fidgeting, or finger and toe tapping.  Impulsive behavior and use of poor judgment, resulting in high-risk activities, such as having unprotected sex or spending excessive amounts of money. Signs and symptoms of depression include the following:   Feelings of sadness, hopelessness, or helplessness.  Frequent or uncontrollable episodes of crying.  Lack of feeling anything or caring about anything.  Difficulty sleeping or sleeping too much.  Inability to enjoy the things you used to enjoy.   Desire to be alone all the time.   Feelings of guilt or worthlessness.  Lack of energy or motivation.   Difficulty concentrating, remembering, or making decisions.  Change in appetite or weight beyond normal fluctuations.  Thoughts of death or the desire to harm yourself. DIAGNOSIS  Bipolar disorder is diagnosed through an assessment by your caregiver. Your caregiver will ask questions about your emotional episodes. There are two main types of bipolar disorder. People with type I bipolar disorder have manic episodes with or without depressive episodes. People  with type II bipolar disorder have hypomanic episodes and major depressive episodes, which are  more serious than mild depression. The type of bipolar disorder you have can make an important difference in how your illness is monitored and treated. Your caregiver may ask questions about your medical history and use of alcohol or drugs, including prescription medication. Certain medical conditions and substances also can cause emotional highs and lows that resemble bipolar disorder (secondary bipolar disorder).  TREATMENT  Bipolar disorder is a long-term illness. It is best controlled with continuous treatment rather than treatment only when symptoms occur. The following treatments can be prescribed for bipolar disorders:  Medication--Medication can be prescribed by a doctor that is an expert in treating mental disorders (psychiatrists). Medications called mood stabilizers are usually prescribed to help control the illness. Other medications are sometimes added if symptoms of mania, depression, or psychotic delusions and hallucinations occur despite the use of a mood stabilizer.  Talk therapy--Some forms of talk therapy are helpful in providing support, education, and guidance. A combination of medication and talk therapy is best for managing the disorder over time. A procedure in which electricity is applied to your brain through your scalp (electroconvulsive therapy) is used in cases of severe mania when medication and talk therapy do not work or work too slowly. Document Released: 08/21/2000 Document Revised: 09/09/2012 Document Reviewed: 06/10/2012 [facility] Patient Information 2015 [place], [place]. This information is not intended to replace advice given to you by your health care provider. Make sure you discuss any questions you have with your health care provider.

## 2014-11-16 NOTE — ED Notes (Signed)
Group home notified that pt is to be discharged.

## 2014-11-16 NOTE — BH Assessment (Signed)
Assessment Note  Daniel Holmes is an 44 y.o. male who presents to the ER, after calling 911, due to being upset with a staff member. According to the pt., Group Home staff was being mean to him, because he didn't give him something for his pain. The pt. Admits to throwing his water at the staff member and calling 911. When [employer] arrived, pt. Voiced SI and then he started rambling things, that didn't make since.  According to the [facility] staff ([phone]), the pt. Asked him for some tylenol and staff offered it to him. The pt, then stated, it was the wrong tylenol. The pt. Then became upset and start voicing threats of wanting to hurt the staff member and other residents. After the staff member didn't respond to the threats, the pt. Throw a cup of water at him and start hitting the wall. Pt. Still didn't get the response he was looking for, so he called 91. Once law enforcement arrived, the pt. Start voicing SI.  When this writer introduced himself and his role, the first thing the pt. Stated was, he don't want to go back to the [facility] and the police told him he can't go back. Writer informed him, he may have to return to the [facility] but it is uncertain at this time. Pt. Then stated, he was going to kill himself. When asked if he had a plan, the pt. Took his right index finger and started pointing towards his the neck. Writer asked the pt. To use his words to explain what he was trying to show him. Pt. Stated "this is how I'm going to kill myself," and then he started poking his neck with his finger. Writer stated he didn't understand what he was saying, "it look like you are poking yourself in the neck." Pt. Replied, "Yep that's what, I'm going to do. I'm going to poke myself in the neck till I die..."  Pt. 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 At this time, we are unsure of his IQ.  This will be the 3rd ER  visit the pt. Has had in the month of June, 2016.  The last two ER visits, the pt. Was admitted to [facility]. He was discharged back to his Group Home. Prior to the months of June, the pt ER Visits was due to medical concerns and not behavioral.    Axis I: ADHD, hyperactive type and Bipolar, Depressed Axis II: Mental retardation, severity unknown Axis III:  Past Medical History  Diagnosis Date  . Psychosis     See Dr O[name]   Axis IV: educational problems, housing problems, occupational problems, other psychosocial or environmental problems, problems related to social environment, problems with access to health care services and problems with primary support group  Past Medical History:  Past Medical History  Diagnosis Date  . Psychosis     See Dr Laverta [name]    History reviewed. No pertinent past surgical history.  Family History: History reviewed. No pertinent family history.  Social History:  reports that he has quit smoking. His smoking use included Cigarettes. He smoked 1.00 pack per day. He does not have any smokeless tobacco history on file. He reports that he does not drink alcohol or use illicit drugs.  Additional Social History:  Alcohol / Drug Use Pain Medications: None Reported Prescriptions: None Reported  Over the Counter: None Reported History of alcohol / drug use?: No history of alcohol / drug abuse Longest period of sobriety (when/how long): None Reported (None Reported) Negative Consequences of Use:  (None Reported) Withdrawal Symptoms:  (None Reported)  CIWA: CIWA-Ar BP: 137/73 mmHg Pulse Rate: 91 COWS:    Allergies:  Allergies  Allergen Reactions  . Penicillins Swelling    Home Medications:  (Not in a hospital admission)  OB/GYN Status:  No LMP for male patient.  General Assessment Data Location of Assessment: [facility] ED TTS Assessment: In system Is this a Tele or Face-to-Face Assessment?:  Face-to-Face Is this an Initial Assessment or a Re-assessment for this encounter?: Initial Assessment Marital status: Single Maiden name: n/a Is patient pregnant?: No Living Arrangements: Group Home ([facility]-[phone]) Can pt return to current living arrangement?: Yes Admission Status: Involuntary Is patient capable of signing voluntary admission?: No Referral Source: Other Risk manager) Insurance type: Medicaid  Medical Screening Exam ([place]) Medical Exam completed: Yes  Crisis Care Plan Living Arrangements: Group Home ([facility]-[phone]) Name of Psychiatrist: n/a Name of Therapist: n/a  Education Status Is patient currently in school?: No Current Grade: n/a Highest grade of school patient has completed: Unknown Name of school: n/a Contact person: n/a  Risk to self with the past 6 months Suicidal Ideation: Yes-Currently Present Has patient been a risk to self within the past 6 months prior to admission? : No Suicidal Intent: No Has patient had any suicidal intent within the past 6 months prior to admission? : Yes (Per Pt. report) Is patient at risk for suicide?: No Suicidal Plan?: Yes-Currently Present (Yes, but not realsitic) Has patient had any suicidal plan within the past 6 months prior to admission? : Yes (Yes, but not realsitic) Specify Current Suicidal Plan: Poke himself in the neck with his finger Access to Means: No What has been your use of drugs/alcohol within the last 12 months?: None Reported Previous Attempts/Gestures: No Other Self Harm Risks: None Reported Triggers for Past Attempts: Other (Comment) (When he doesn't get his way) Intentional Self Injurious Behavior: None Comment - Self Injurious Behavior: None Reported Family Suicide History: Unknown Recent stressful life event(s): Other (Comment) (When he doesn't get his way) Persecutory voices/beliefs?: No Depression: Yes Depression Symptoms: Feeling  angry/irritable, Feeling worthless/self pity, Loss of interest in usual pleasures Substance abuse history and/or treatment for substance abuse?: No Suicide prevention information given to non-admitted patients: Not applicable  Risk to Others within the past 6 months Homicidal Ideation: No Does patient have any lifetime risk of violence toward others beyond the six months prior to admission? : No Thoughts of Harm to Others: No Current Homicidal Intent: No Current Homicidal Plan: No Access to Homicidal Means: No Identified Victim: None Reported History of harm to others?: No Assessment of Violence: In distant past Violent Behavior Description: Throw water at a [facility] staff member Does patient have access to weapons?: No Does patient have a court date: No Is patient on probation?: No  Psychosis Hallucinations: Auditory Delusions: Persecutory  Mental Status Report Appearance/Hygiene: Unremarkable, In scrubs, In hospital gown Eye Contact: Fair Motor Activity: Freedom of movement Speech: Pressured, Slurred (Difficult to understand) Level of Consciousness: Alert, Restless Mood: Anxious, Suspicious, Helpless, Irritable, Sad Affect: Appropriate to circumstance, Sad Anxiety Level: Moderate Thought Processes: Irrelevant, Tangential Judgement: Impaired Orientation: Person, Place, Time, Situation, Appropriate for developmental age Obsessive Compulsive Thoughts/Behaviors: Minimal  Cognitive Functioning Concentration: Fair Memory: Recent Intact, Remote Intact IQ: Below Average  Level of Function: MIld Insight: Poor Impulse Control: Poor Appetite: Good Weight Loss: 0 Weight Gain: 0 Sleep: Decreased Total Hours of Sleep: 5 Vegetative Symptoms: None  ADLScreening [facility] Assessment Services) Patient's cognitive ability adequate to safely complete daily activities?: Yes Patient able to express need for assistance with ADLs?: Yes Independently performs ADLs?: Yes (appropriate for  developmental age)  Prior Inpatient Therapy Prior Inpatient Therapy: Yes Prior Therapy Dates: 10/2014 Prior Therapy Facilty/Provider(s): [facility] Reason for Treatment: Depression and Suicidal Gestures  Prior Outpatient Therapy Prior Outpatient Therapy: No Prior Therapy Dates: n/a Prior Therapy Facilty/Provider(s): n/a Reason for Treatment: bipolar Does patient have an ACCT team?: No Does patient have Intensive In-House Services?  : No Does patient have [facility] services? : No Does patient have P4CC services?: No  ADL Screening (condition at time of admission) Patient's cognitive ability adequate to safely complete daily activities?: Yes Patient able to express need for assistance with ADLs?: Yes Independently performs ADLs?: Yes (appropriate for developmental age)       Abuse/Neglect Assessment (Assessment to be complete while patient is alone) Physical Abuse: Denies Verbal Abuse: Denies Sexual Abuse: Denies Exploitation of patient/patient's resources: Denies Self-Neglect: Denies Possible abuse reported to:: [place] department of social services Values / Beliefs Cultural Requests During Hospitalization: None Spiritual Requests During Hospitalization: None Consults Spiritual Care Consult Needed: No Social Work Consult Needed: No Regulatory affairs officer (For Healthcare) Does patient have an advance directive?: No Would patient like information on creating an advanced directive?: Yes Higher education careers adviser given    Additional Information 1:1 In Past 12 Months?: No CIRT Risk: No Elopement Risk: No Does patient have medical clearance?: Yes  Child/Adolescent Assessment Running Away Risk: Denies (Pt. is an adult)  Disposition:  Disposition Initial Assessment Completed for this Encounter: Yes Disposition of Patient: Other dispositions (Psych MD to see) Other disposition(s): Other (Comment) (Psych MD to see) Patient referred to: Other (Comment) (Psych MD to see)  On Site  Evaluation by:   Reviewed with Physician:    Durene Cal 11/16/2014 12:30 AM

## 2014-11-16 NOTE — ED Notes (Signed)
BEHAVIORAL HEALTH ROUNDING Patient sleeping: No. Patient alert and oriented: yes Behavior appropriate: Yes.  ; If no, describe:  Nutrition and fluids offered: Yes  Toileting and hygiene offered: Yes  Sitter present: no Law enforcement present: Yes    Daniel Holmes in to speak with pt at this time

## 2014-11-16 NOTE — ED Notes (Signed)
BEHAVIORAL HEALTH ROUNDING Patient sleeping: No. Patient alert and oriented: yes Behavior appropriate: Yes.  ; If no, describe:  Nutrition and fluids offered: Yes  Toileting and hygiene offered: Yes  Sitter present: no Law enforcement present: Yes   Pt sitting in day room at this time.

## 2014-11-16 NOTE — ED Notes (Signed)
BEHAVIORAL HEALTH ROUNDING Patient sleeping: Yes.   Patient alert and oriented: yes Behavior appropriate: Yes.  ; If no, describe:  Nutrition and fluids offered: Yes  Toileting and hygiene offered: Yes  Sitter present: no Law enforcement present: Yes  

## 2014-11-16 NOTE — ED Notes (Signed)
Pt awaiting discharged instructions

## 2014-11-16 NOTE — BH Assessment (Signed)
Assessment Note  Daniel Holmes is an 44 y.o. male, who presented to the ED stating, " I want a new group home; I don't like this one; they holler and fuss at me; If I go back there; I'll do it again; I'll put a belt around my neck." Client has had (2) inpatient admissions within the past (2) weeks. Now stating; I just want a new group home. Client has had explanations from the staff in the emergency room of how the ER is not a place for new group home placement. Client then stated that, he will be glad to go back to the group home; and that, he will not try to hurt himself when he gets backi there." Crisis line information has been provided.  Axis I: ADHD, combined type and Bipolar, Depressed Axis II: Deferred Axis III:  Past Medical History  Diagnosis Date  . Psychosis     See Dr O[name]   Axis IV: housing problems and other psychosocial or environmental problems Axis V: 51-60 moderate symptoms  Past Medical History:  Past Medical History  Diagnosis Date  . Psychosis     See Dr Laverta [name]    History reviewed. No pertinent past surgical history.  Family History: History reviewed. No pertinent family history.  Social History:  reports that he has quit smoking. His smoking use included Cigarettes. He smoked 1.00 pack per day. He does not have any smokeless tobacco history on file. He reports that he does not drink alcohol or use illicit drugs.  Additional Social History:  Alcohol / Drug Use Pain Medications: None Reported Prescriptions: None Reported Over the Counter: None Reported History of alcohol / drug use?: No history of alcohol / drug abuse Longest period of sobriety (when/how long): None Reported (None Reported) Negative Consequences of Use:  (None Reported) Withdrawal Symptoms:  (None Reported)  CIWA: CIWA-Ar BP: 109/74 mmHg Pulse Rate: 81 COWS:    Allergies:  Allergies  Allergen Reactions  . Penicillins  Swelling    Home Medications:  (Not in a hospital admission)  OB/GYN Status:  No LMP for male patient.  General Assessment Data Location of Assessment: [facility] ED TTS Assessment: In system Is this a Tele or Face-to-Face Assessment?: Face-to-Face Is this an Initial Assessment or a Re-assessment for this encounter?: Re-Assessment Marital status: Single Maiden name:  (n/a) Is patient pregnant?: No Pregnancy Status: No Living Arrangements: Group Home Can pt return to current living arrangement?:  (unknown) Admission Status: Involuntary Is patient capable of signing voluntary admission?: No Referral Source: Self/Family/Friend Insurance type: [place] Medicaid  Medical Screening Exam ([place]) Medical Exam completed:  Claria Dice)  Crisis Care Plan Living Arrangements: Group Home Name of Psychiatrist: n/a Name of Therapist: n/a  Education Status Is patient currently in school?: No Current Grade: n/a Highest grade of school patient has completed: Unknown Name of school: n/a Contact person: n/a  Risk to self with the past 6 months Suicidal Ideation: Yes-Currently Present Has patient been a risk to self within the past 6 months prior to admission? : No Suicidal Intent:  (to put a belt around my neck) Has patient had any suicidal intent within the past 6 months prior to admission? : Yes Is patient at risk for suicide?: Yes Suicidal Plan?: Yes-Currently Present Has patient had any suicidal plan within the past 6 months prior to admission? : Yes Specify Current Suicidal Plan:  (to put a belt around my neck) Access to  Means: Yes Specify Access to Suicidal Means: to use a belt What has been your use of drugs/alcohol within the last 12 months?: none Previous Attempts/Gestures: No How many times?: 0 Other Self Harm Risks: 0 Triggers for Past Attempts: Other (Comment) Intentional Self Injurious Behavior: None Comment - Self Injurious Behavior: none Family Suicide History:  Unknown Recent stressful life event(s): Conflict (Comment) (at the group home with the staff) Persecutory voices/beliefs?: No Depression: Yes Depression Symptoms:  (anxious) Substance abuse history and/or treatment for substance abuse?: No Suicide prevention information given to non-admitted patients: Yes  Risk to Others within the past 6 months Homicidal Ideation: No Does patient have any lifetime risk of violence toward others beyond the six months prior to admission? : No Thoughts of Harm to Others: No Current Homicidal Intent: No Current Homicidal Plan: No Access to Homicidal Means: No Identified Victim:  (none) History of harm to others?: No Assessment of Violence: On admission Violent Behavior Description: none Does patient have access to weapons?: No Criminal Charges Pending?: No Does patient have a court date: No Is patient on probation?: No  Psychosis Hallucinations: None noted Delusions: None noted  Mental Status Report Appearance/Hygiene: In scrubs, Unremarkable Eye Contact: Good Motor Activity: Unremarkable Speech: Pressured, Slurred, Rapid Level of Consciousness: Alert, Quiet/awake Mood: Helpless Affect: Appropriate to circumstance, Sad Anxiety Level: Moderate Thought Processes: Circumstantial Judgement: Impaired Orientation: Person, Place, Not oriented (requesting a different group home placement) Obsessive Compulsive Thoughts/Behaviors: Minimal  Cognitive Functioning Concentration: Decreased Memory:  (Staff was fussing at me; has had 2 admissions in 2 weeks) IQ: Below Average Level of Function: [place] Insight: Poor Impulse Control: Fair Appetite: Good Weight Loss: 0 Weight Gain: 0 Sleep: No Change Total Hours of Sleep: 5 Vegetative Symptoms: None  ADLScreening [facility] Assessment Services) Patient's cognitive ability adequate to safely complete daily activities?: Yes Patient able to express need for assistance with ADLs?: Yes Independently performs  ADLs?: Yes (appropriate for developmental age)  Prior Inpatient Therapy Prior Inpatient Therapy: Yes Prior Therapy Dates: 10/2014 Prior Therapy Facilty/Provider(s): [facility] Reason for Treatment: Depression and Suicidal Gestures  Prior Outpatient Therapy Prior Outpatient Therapy: No Prior Therapy Dates: n/a Prior Therapy Facilty/Provider(s): n/a Reason for Treatment: bipolar Does patient have an ACCT team?: No Does patient have Intensive In-House Services?  : No Does patient have [facility] services? : No Does patient have P4CC services?: No  ADL Screening (condition at time of admission) Patient's cognitive ability adequate to safely complete daily activities?: Yes Patient able to express need for assistance with ADLs?: Yes Independently performs ADLs?: Yes (appropriate for developmental age)       Abuse/Neglect Assessment (Assessment to be complete while patient is alone) Physical Abuse: Denies Verbal Abuse: Denies Sexual Abuse: Denies Exploitation of patient/patient's resources: Denies Self-Neglect: Denies Possible abuse reported to:: [place] / Beliefs Cultural Requests During Hospitalization: None Spiritual Requests During Hospitalization: None Consults Spiritual Care Consult Needed: No Social Work Consult Needed: No Regulatory affairs officer (For Healthcare) Does patient have an advance directive?: No Would patient like information on creating an advanced directive?: Yes Higher education careers adviser given    Additional Information 1:1 In Past 12 Months?: No CIRT Risk: No Elopement Risk: No Does patient have medical clearance?: Yes  Child/Adolescent Assessment Running Away Risk: Denies Bed-Wetting: Denies Destruction of Property: Denies Cruelty to Animals: Denies Stealing: Denies Rebellious/Defies Authority: Denies Satanic Involvement: Denies Science writer: Denies Problems at [employer]: Denies Gang Involvement: Denies  Disposition:   Disposition Initial Assessment Completed for  this Encounter: Yes Disposition of Patient: Outpatient treatment Type of outpatient treatment: Adult Other disposition(s): Other (Comment) Patient referred to: Other (Comment)  On Site Evaluation by:   Reviewed with Physician:    Maris Berger 11/16/2014 4:55 PM

## 2014-11-16 NOTE — ED Notes (Signed)
  Patient assigned to appropriate care area. Patient oriented to unit/care area: Informed that, for their safety, care areas are designed for safety and monitored by security cameras at all times; and visiting hours explained to patient. Patient verbalizes understanding, and verbal contract for safety obtained.  Pt verbalized understanding. Sitting in room using TV remote.   ENVIRONMENTAL ASSESSMENT Potentially harmful objects out of patient reach: Yes.   Personal belongings secured: Yes.   Patient dressed in hospital provided attire only: Yes.   Plastic bags out of patient reach: Yes.   Patient care equipment (cords, cables, call bells, lines, and drains) shortened, removed, or accounted for: Yes.   Equipment and supplies removed from bottom of stretcher: Yes.   Potentially toxic materials out of patient reach: Yes.   Sharps container removed or out of patient reach: Yes.    ED BHU [place] Is the patient under IVC or is there intent for IVC: No.   Is the patient medically cleared: Yes.   Is there vacancy in the ED BHU: Yes.   Is the population mix appropriate for patient: Yes.   Is the patient awaiting placement in inpatient or outpatient setting: Yes.   Has the patient had a psychiatric consult: Yes.   Survey of unit performed for contraband, proper placement and condition of furniture, tampering with fixtures in bathroom, shower, and each patient room: Yes.  ; Findings: all clear APPEARANCE/BEHAVIOR calm and cooperative NEURO ASSESSMENT Orientation: time, place and person Hallucinations: No.None noted (Hallucinations) Speech: Normal Gait: normal RESPIRATORY ASSESSMENT Normal expansion.  Clear to auscultation.  No rales, rhonchi, or wheezing. CARDIOVASCULAR ASSESSMENT regular rate and rhythm, S1, S2 normal, no murmur, click, rub or gallop GASTROINTESTINAL ASSESSMENT soft, nontender, BS WNL, no r/g EXTREMITIES normal strength, tone, and muscle mass PLAN OF  CARE Provide calm/safe environment. Vital signs assessed twice daily. ED BHU Assessment once each 12-hour shift. Collaborate with intake RN daily or as condition indicates. Assure the ED provider has rounded once each shift. Provide and encourage hygiene. Provide redirection as needed. Assess for escalating behavior; address immediately and inform ED provider.  Assess family dynamic and appropriateness for visitation as needed: No.; If necessary, describe findings: not observed Educate the patient/family about BHU procedures/visitation: No.; If necessary, describe findings: NA

## 2014-11-16 NOTE — ED Notes (Signed)
Dr Weber Cooks in to see pt at this time.

## 2014-11-19 ENCOUNTER — Other Ambulatory Visit: Payer: Self-pay | Admitting: Family Medicine

## 2014-11-22 ENCOUNTER — Encounter (HOSPITAL_COMMUNITY): Payer: Self-pay

## 2014-11-22 ENCOUNTER — Emergency Department (HOSPITAL_COMMUNITY)
Admission: EM | Admit: 2014-11-22 | Discharge: 2014-11-24 | Disposition: A | Discharge status: Home / Self Care | Payer: Medicaid Other | Attending: Emergency Medicine | Admitting: Emergency Medicine

## 2014-11-22 DIAGNOSIS — Z79899 Other long term (current) drug therapy: Secondary | ICD-10-CM | POA: Diagnosis not present

## 2014-11-22 DIAGNOSIS — Z046 Encounter for general psychiatric examination, requested by authority: Secondary | ICD-10-CM | POA: Diagnosis present

## 2014-11-22 DIAGNOSIS — Y9389 Activity, other specified: Secondary | ICD-10-CM | POA: Insufficient documentation

## 2014-11-22 DIAGNOSIS — F70 Mild intellectual disabilities: Secondary | ICD-10-CM | POA: Diagnosis present

## 2014-11-22 DIAGNOSIS — S50312A Abrasion of left elbow, initial encounter: Secondary | ICD-10-CM | POA: Insufficient documentation

## 2014-11-22 DIAGNOSIS — J45909 Unspecified asthma, uncomplicated: Secondary | ICD-10-CM | POA: Insufficient documentation

## 2014-11-22 DIAGNOSIS — Z880 Allergy status to penicillin: Secondary | ICD-10-CM | POA: Diagnosis not present

## 2014-11-22 DIAGNOSIS — R45851 Suicidal ideations: Secondary | ICD-10-CM

## 2014-11-22 DIAGNOSIS — S1081XA Abrasion of other specified part of neck, initial encounter: Secondary | ICD-10-CM | POA: Insufficient documentation

## 2014-11-22 DIAGNOSIS — Y998 Other external cause status: Secondary | ICD-10-CM | POA: Insufficient documentation

## 2014-11-22 DIAGNOSIS — Z87891 Personal history of nicotine dependence: Secondary | ICD-10-CM | POA: Insufficient documentation

## 2014-11-22 DIAGNOSIS — X788XXA Intentional self-harm by other sharp object, initial encounter: Secondary | ICD-10-CM | POA: Insufficient documentation

## 2014-11-22 DIAGNOSIS — F29 Unspecified psychosis not due to a substance or known physiological condition: Secondary | ICD-10-CM | POA: Diagnosis not present

## 2014-11-22 DIAGNOSIS — Y92091 Bathroom in other non-institutional residence as the place of occurrence of the external cause: Secondary | ICD-10-CM | POA: Insufficient documentation

## 2014-11-22 HISTORY — DX: Unspecified asthma, uncomplicated: J45.909

## 2014-11-22 LAB — RAPID URINE DRUG SCREEN, HOSP PERFORMED
AMPHETAMINES: NOT DETECTED
Barbiturates: NOT DETECTED
Benzodiazepines: NOT DETECTED
Cocaine: NOT DETECTED
Opiates: NOT DETECTED
TETRAHYDROCANNABINOL: NOT DETECTED

## 2014-11-22 MED ORDER — ACETAMINOPHEN 325 MG PO TABS
650.0000 mg | ORAL_TABLET | Freq: Once | ORAL | Status: AC
  Administered 2014-11-22: 650 mg via ORAL
  Filled 2014-11-22: qty 2

## 2014-11-22 NOTE — ED Notes (Signed)
Tele psych machine moved to room,

## 2014-11-22 NOTE — ED Provider Notes (Signed)
CSN: [id number]     Arrival date & time 11/22/14  1827 History   First MD Initiated Contact with Patient 11/22/14 1844     Chief Complaint  Patient presents with  . V70.1      HPI  Patient presents for evaluation after some self-inflicted abrasions to his neck and left arm. Patient lives in a group home. History is limited intellectual function. History of previous recent episodes of evaluation for suicidal thoughts or statements.  He normally lives at a group home in [place]. They're visiting another group home today for a church event. "Scott" as he likes to be called states that someone on the phone was talking and he felt that they were talking about threatening him or killing him. He then went into a bathroom and use a safety razor to try to hurt himself he has some abrasions to his neck and arm. Staff was able to get the razor from him and bring him here.   Past Medical History  Diagnosis Date  . Psychosis     See Dr Laverta [name]  . Asthma    Past Surgical History  Procedure Laterality Date  . Hernia repair     No family history on file. History  Substance Use Topics  . Smoking status: Former Smoker -- 1.00 packs/day    Types: Cigarettes  . Smokeless tobacco: Not on file  . Alcohol Use: No    Review of Systems  Constitutional: Negative for fever, chills, diaphoresis, appetite change and fatigue.  HENT: Negative for mouth sores, sore throat and trouble swallowing.   Eyes: Negative for visual disturbance.  Respiratory: Negative for cough, chest tightness, shortness of breath and wheezing.   Cardiovascular: Negative for chest pain.  Gastrointestinal: Negative for nausea, vomiting, abdominal pain, diarrhea and abdominal distention.  Endocrine: Negative for polydipsia, polyphagia and polyuria.  Genitourinary: Negative for dysuria, frequency and hematuria.  Musculoskeletal: Negative for gait problem.  Skin: Positive for wound. Negative for  color change, pallor and rash.  Neurological: Negative for dizziness, syncope, light-headedness and headaches.  Hematological: Does not bruise/bleed easily.  Psychiatric/Behavioral: Positive for suicidal ideas and self-injury. Negative for behavioral problems and confusion.      Allergies  Penicillins  Home Medications   Prior to Admission medications   Medication Sig Start Date End Date Taking? Authorizing Provider  cloNIDine (CATAPRES) 0.1 MG tablet Take 1 tablet (0.1 mg total) by mouth 2 (two) times daily. 11/10/14   Clovis Fredrickson, MD  levothyroxine (SYNTHROID, LEVOTHROID) 75 MCG tablet TAKE (1) TABLET BY MOUTH ONCE DAILY BEFORE BREAKFAST. 11/19/14   Lind Covert, MD  lithium carbonate (ESKALITH) 450 MG CR tablet Take 1 tablet (450 mg total) by mouth every 12 (twelve) hours. 11/10/14   Clovis Fredrickson, MD  loratadine (CLARITIN) 10 MG tablet TAKE (1) TABLET BY MOUTH ONCE DAILY. 11/19/14   Lind Covert, MD  Multiple Vitamins-Minerals (MULTIVITAMIN ADULTS 50+) TABS Take 1 tablet by mouth daily. 11/10/14   Clovis Fredrickson, MD  OLANZapine (ZYPREXA) 20 MG tablet Take 1.5 tablets (30 mg total) by mouth at bedtime. Take 1 and 1/2 tablets by mouth at bedtime. 11/10/14   Clovis Fredrickson, MD  pantoprazole (PROTONIX) 40 MG tablet Take 1 tablet (40 mg total) by mouth daily. 11/10/14   Clovis Fredrickson, MD  traZODone (DESYREL) 150 MG tablet Take 1 tablet (150 mg total) by mouth at bedtime. 11/10/14   Clovis Fredrickson, MD  Vitamin D, Ergocalciferol, (  DRISDOL) 50000 UNITS CAPS capsule Take 1 capsule (50,000 Units total) by mouth every 14 (fourteen) days. 11/10/14   Jolanta B Pucilowska, MD   BP 104/66 mmHg  Pulse 70  Temp(Src) 98.4 F (36.9 C) (Oral)  Resp 16  Wt 175 lb (79.379 kg)  SpO2 97% Physical Exam  Constitutional: He is oriented to person, place, and time. He appears well-developed and well-nourished. No distress.  Rapid and dysarthric speech.  Caregiver states this is his baseline.  HENT:  Head: Normocephalic.  Eyes: Conjunctivae are normal. Pupils are equal, round, and reactive to light. No scleral icterus.  Neck: Normal range of motion. Neck supple. No thyromegaly present.  Cardiovascular: Normal rate and regular rhythm.  Exam reveals no gallop and no friction rub.   No murmur heard. Pulmonary/Chest: Effort normal and breath sounds normal. No respiratory distress. He has no wheezes. He has no rales.  Abdominal: Soft. Bowel sounds are normal. He exhibits no distension. There is no tenderness. There is no rebound.  Musculoskeletal: Normal range of motion.  Neurological: He is alert and oriented to person, place, and time.  Skin: Skin is warm and dry. No rash noted.  Minimal superficial abrasions to the anterior neck, and left antecubital fossa  Psychiatric: He has a normal mood and affect. His behavior is normal.    ED Course  Procedures (including critical care time) Labs Review Labs Reviewed  URINE RAPID DRUG SCREEN, HOSP PERFORMED  ACETAMINOPHEN LEVEL  SALICYLATE LEVEL  CBC WITH DIFFERENTIAL/PLATELET  COMPREHENSIVE METABOLIC PANEL    Imaging Review No results found.   EKG Interpretation None      MDM   Final diagnoses:  Suicidal ideation    Patient awaiting TTS evaluation.   Tanna Furry, MD 11/23/14 Dyann Kief

## 2014-11-22 NOTE — BH Assessment (Signed)
Tele Assessment Note   Daniel Holmes is a 44 y.o. male who voluntarily presents to [facility] with SI thoughts and depression x 5 mos.  Pt reports the following: pt cut his neck and arm with razor blade, stating that he became upset with his caregivers at [facility] and states he was visiting [place] and staff members were on the phone making threats about a pt.  Pt was visiting group for church event and says he thought the phone called was about the people were trying threatening him or killing him.Pt states he's tried to harm himself 5x's in the past by cutting his wrists with a butter knife and has been hearing voices since his teens with command to harm himself.  Pt was previously seen for the same thing at [facility], he tried to hang self with a belt.  Pt has abrasions to neck and arm, wound has been dressed.  Pt has speech   Axis I: Bipolar I disorder, Current or most recent episode depressed, With psychotic features Axis II: Mental retardation, severity unknown Axis III:  Past Medical History  Diagnosis Date  . Psychosis     See Dr Laverta [place] [name]  . Asthma    Axis IV: other psychosocial or environmental problems, problems related to social environment and problems with primary support group Axis V: 31-40 impairment in reality testing  Past Medical History:  Past Medical History  Diagnosis Date  . Psychosis     See Dr Laverta [name]  . Asthma     Past Surgical History  Procedure Laterality Date  . Hernia repair      Family History: No family history on file.  Social History:  reports that he has quit smoking. His smoking use included Cigarettes. He smoked 1.00 pack per day. He does not have any smokeless tobacco history on file. He reports that he does not drink alcohol or use illicit drugs.  Additional Social History:  Alcohol / Drug Use Pain Medications: See MAR  Prescriptions: See MAR  Over the Counter: See MAR   History of alcohol / drug use?: No history of alcohol / drug abuse Longest period of sobriety (when/how long): None   CIWA: CIWA-Ar BP: 104/66 mmHg Pulse Rate: 70 COWS:    PATIENT STRENGTHS: (choose at least two) Motivation for treatment/growth  Allergies:  Allergies  Allergen Reactions  . Penicillins Swelling    Home Medications:  (Not in a hospital admission)  OB/GYN Status:  No LMP for male patient.  General Assessment Data Location of Assessment: [facility] Assessment: In system Is this a Tele or Face-to-Face Assessment?: Tele Assessment Is this an Initial Assessment or a Re-assessment for this encounter?: Initial Assessment Marital status: Single Maiden name: None  Is patient pregnant?: No Pregnancy Status: No Living Arrangements: Group Home ([facility] ) Can pt return to current living arrangement?: Yes Admission Status: Voluntary Is patient capable of signing voluntary admission?: Yes Referral Source: MD Insurance type: MCD  Medical Screening Exam ([place]) Medical Exam completed: No Reason for MSE not completed: Other:  Crisis Care Plan Living Arrangements: Group Home ([facility] ) Name of Psychiatrist: [facility]  Name of Therapist: None   Education Status Is patient currently in school?: No Current Grade: None  Highest grade of school patient has completed: None  Name of school: None  Contact person: None   Risk to self with the past 6 months Suicidal Ideation: Yes-Currently Present  Has patient been a risk to self within the past 6 months prior to admission? : Yes Suicidal Intent: Yes-Currently Present Has patient had any suicidal intent within the past 6 months prior to admission? : Yes Is patient at risk for suicide?: Yes Suicidal Plan?: Yes-Currently Present Has patient had any suicidal plan within the past 6 months prior to admission? : Yes Specify Current Suicidal Plan: Cut self w/razor blade  Access to Means:  Yes Specify Access to Suicidal Means: Sharps, razor blades  What has been your use of drugs/alcohol within the last 12 months?: Denies  Previous Attempts/Gestures: Yes How many times?: 5 Other Self Harm Risks: Noen  Triggers for Past Attempts: Other personal contacts, Unpredictable Intentional Self Injurious Behavior: None Comment - Self Injurious Behavior: None  Family Suicide History: No Recent stressful life event(s): Conflict (Comment) (Issues with group home caregivers ) Persecutory voices/beliefs?: Yes Depression: Yes Depression Symptoms: Loss of interest in usual pleasures, Feeling worthless/self pity Substance abuse history and/or treatment for substance abuse?: No Suicide prevention information given to non-admitted patients: Not applicable  Risk to Others within the past 6 months Homicidal Ideation: No Does patient have any lifetime risk of violence toward others beyond the six months prior to admission? : No Thoughts of Harm to Others: No Current Homicidal Intent: No Current Homicidal Plan: No Access to Homicidal Means: No Identified Victim: None  History of harm to others?: No Assessment of Violence: None Noted Violent Behavior Description: None  Does patient have access to weapons?: No Criminal Charges Pending?: No Does patient have a court date: No Is patient on probation?: No  Psychosis Hallucinations: Auditory, With command Delusions: None noted  Mental Status Report Appearance/Hygiene: In scrubs, Unremarkable Eye Contact: Good Motor Activity: Unremarkable Speech: Logical/coherent, Other (Comment) (Speech impediment ) Level of Consciousness: Alert Mood: Depressed, Preoccupied Affect: Depressed, Appropriate to circumstance, Preoccupied Anxiety Level: None Thought Processes: Coherent, Relevant Judgement: Impaired Orientation: Person, Place, Time, Situation Obsessive Compulsive Thoughts/Behaviors: None  Cognitive Functioning Concentration:  Normal Memory: Recent Intact, Remote Intact IQ: Below Average Level of Function: Unk  Insight: Poor Impulse Control: Poor Appetite: Good Weight Loss: 0 Weight Gain: 0 Sleep: No Change Total Hours of Sleep: 5 Vegetative Symptoms: None  ADLScreening [facility]) Patient's cognitive ability adequate to safely complete daily activities?: Yes Patient able to express need for assistance with ADLs?: Yes Independently performs ADLs?: Yes (appropriate for developmental age)  Prior Inpatient Therapy Prior Inpatient Therapy: Yes Prior Therapy Dates: 2016, 2008,2002,2001,  Prior Therapy Facilty/Provider(s): [facility] Reason for Treatment: Depression and Suicidal Gestures  Prior Outpatient Therapy Prior Outpatient Therapy: Yes Prior Therapy Dates: Current Prior Therapy Facilty/Provider(s): [facility]  Reason for Treatment: Bipolar/SI  Does patient have an ACCT team?: No Does patient have Intensive In-House Services?  : No Does patient have [facility] services? : No Does patient have P4CC services?: No  ADL Screening (condition at time of admission) Patient's cognitive ability adequate to safely complete daily activities?: Yes Is the patient deaf or have difficulty hearing?: No Does the patient have difficulty seeing, even when wearing glasses/contacts?: No Does the patient have difficulty concentrating, remembering, or making decisions?: Yes Patient able to express need for assistance with ADLs?: Yes Does the patient have difficulty dressing or bathing?: No Independently performs ADLs?: Yes (appropriate for developmental age) Does the patient have difficulty walking or climbing stairs?: No Weakness of Legs: None Weakness of Arms/Hands: None  Home Assistive Devices/Equipment Home Assistive Devices/Equipment: None  Therapy Consults (therapy consults require  a physician order) PT Evaluation Needed: No OT Evalulation Needed: No SLP Evaluation Needed: No Abuse/Neglect  Assessment (Assessment to be complete while patient is alone) Physical Abuse: Denies Verbal Abuse: Denies Sexual Abuse: Denies Exploitation of patient/patient's resources: Denies Self-Neglect: Denies Values / Beliefs Cultural Requests During Hospitalization: None Spiritual Requests During Hospitalization: None Consults Spiritual Care Consult Needed: No Social Work Consult Needed: No Regulatory affairs officer (For Healthcare) Does patient have an advance directive?: No Would patient like information on creating an advanced directive?: No - patient declined information    Additional Information 1:1 In Past 12 Months?: No CIRT Risk: No Elopement Risk: No Does patient have medical clearance?: Yes     Disposition:  Disposition Initial Assessment Completed for this Encounter: Yes Disposition of Patient: Inpatient treatment program, Referred to (Per Arlester Marker, NP meets criteria fo rinpt admit ) Type of inpatient treatment program: Adult Type of outpatient treatment: Adult Other disposition(s): Other (Comment) (Per Arlester Marker, NP meets criteria for inpt admit ) Patient referred to: Other (Comment) (Per Arlester Marker, NP meets criteria for inpt admit )  Girtha Rm 11/22/2014 11:46 PM

## 2014-11-22 NOTE — ED Notes (Addendum)
EMS reports pt from [facility] home and got upset with caregivers and cut neck with "safety razor blade."  EMS reports very superficial wound to neck and left ac.   Per pt, he was visiting [facility] group home today and a staff member from [place]  was on the phone making threats about pt.

## 2014-11-22 NOTE — ED Notes (Signed)
Tele psych being performed at present time,  

## 2014-11-22 NOTE — ED Notes (Signed)
Pt has abrasions to neck and let a/c no deep laceration or bleeding noted. Group home staff states he did it with a razor

## 2014-11-22 NOTE — ED Notes (Signed)
Patient states he feel he can't breathe well.  Lungs clear bilaterally and assured patient his O2 saturation is okay.  Several superficial abrasions over anterior surface of neck.  Cleaned dried blood from face and neck.  Dressing to L forearm clean and intact.  Patient reports SI.

## 2014-11-22 NOTE — ED Notes (Signed)
Pt c/o pain to left elbow, Dr Jeneen Rinks Notified, additional orders given,

## 2014-11-23 LAB — CBC WITH DIFFERENTIAL/PLATELET
Basophils Absolute: 0 10*3/uL (ref 0.0–0.1)
Basophils Relative: 0 % (ref 0–1)
EOS ABS: 0.1 10*3/uL (ref 0.0–0.7)
EOS PCT: 1 % (ref 0–5)
HCT: 36.3 % — ABNORMAL LOW (ref 39.0–52.0)
Hemoglobin: 12.4 g/dL — ABNORMAL LOW (ref 13.0–17.0)
LYMPHS ABS: 2.4 10*3/uL (ref 0.7–4.0)
Lymphocytes Relative: 45 % (ref 12–46)
MCH: 32 pg (ref 26.0–34.0)
MCHC: 34.2 g/dL (ref 30.0–36.0)
MCV: 93.6 fL (ref 78.0–100.0)
Monocytes Absolute: 0.3 10*3/uL (ref 0.1–1.0)
Monocytes Relative: 6 % (ref 3–12)
Neutro Abs: 2.6 10*3/uL (ref 1.7–7.7)
Neutrophils Relative %: 48 % (ref 43–77)
PLATELETS: 154 10*3/uL (ref 150–400)
RBC: 3.88 MIL/uL — ABNORMAL LOW (ref 4.22–5.81)
RDW: 12.1 % (ref 11.5–15.5)
WBC: 5.4 10*3/uL (ref 4.0–10.5)

## 2014-11-23 LAB — COMPREHENSIVE METABOLIC PANEL
ALK PHOS: 68 U/L (ref 38–126)
ALT: 17 U/L (ref 17–63)
AST: 23 U/L (ref 15–41)
Albumin: 4.1 g/dL (ref 3.5–5.0)
Anion gap: 7 (ref 5–15)
BUN: 6 mg/dL (ref 6–20)
CO2: 23 mmol/L (ref 22–32)
Calcium: 8.9 mg/dL (ref 8.9–10.3)
Chloride: 109 mmol/L (ref 101–111)
Creatinine, Ser: 0.72 mg/dL (ref 0.61–1.24)
GLUCOSE: 101 mg/dL — AB (ref 65–99)
Potassium: 3.6 mmol/L (ref 3.5–5.1)
SODIUM: 139 mmol/L (ref 135–145)
Total Bilirubin: 0.2 mg/dL — ABNORMAL LOW (ref 0.3–1.2)
Total Protein: 6.4 g/dL — ABNORMAL LOW (ref 6.5–8.1)

## 2014-11-23 LAB — SALICYLATE LEVEL: Salicylate Lvl: <4 mg/dL (ref 2.8–30.0)

## 2014-11-23 LAB — ACETAMINOPHEN LEVEL

## 2014-11-23 MED ORDER — CLONIDINE HCL 0.1 MG PO TABS
0.1000 mg | ORAL_TABLET | Freq: Two times a day (BID) | ORAL | Status: DC
  Administered 2014-11-23 – 2014-11-24 (×3): 0.1 mg via ORAL
  Filled 2014-11-23 (×3): qty 1

## 2014-11-23 MED ORDER — ADULT MULTIVITAMIN W/MINERALS CH
1.0000 | ORAL_TABLET | Freq: Every day | ORAL | Status: DC
  Administered 2014-11-23 – 2014-11-24 (×2): 1 via ORAL
  Filled 2014-11-23 (×2): qty 1

## 2014-11-23 MED ORDER — OLANZAPINE 5 MG PO TABS
30.0000 mg | ORAL_TABLET | Freq: Every day | ORAL | Status: DC
  Administered 2014-11-23: 30 mg via ORAL
  Filled 2014-11-23: qty 6

## 2014-11-23 MED ORDER — LEVOTHYROXINE SODIUM 50 MCG PO TABS
75.0000 ug | ORAL_TABLET | Freq: Every morning | ORAL | Status: DC
  Administered 2014-11-23 – 2014-11-24 (×2): 75 ug via ORAL
  Filled 2014-11-23 (×2): qty 2

## 2014-11-23 MED ORDER — LITHIUM CARBONATE ER 450 MG PO TBCR
450.0000 mg | EXTENDED_RELEASE_TABLET | Freq: Two times a day (BID) | ORAL | Status: DC
  Administered 2014-11-23 – 2014-11-24 (×3): 450 mg via ORAL
  Filled 2014-11-23 (×5): qty 1

## 2014-11-23 MED ORDER — BACITRACIN ZINC 500 UNIT/GM EX OINT
TOPICAL_OINTMENT | CUTANEOUS | Status: AC
  Administered 2014-11-23: 21:00:00
  Filled 2014-11-23: qty 0.9

## 2014-11-23 MED ORDER — TRAZODONE HCL 50 MG PO TABS
150.0000 mg | ORAL_TABLET | Freq: Every day | ORAL | Status: DC
  Administered 2014-11-23: 150 mg via ORAL
  Filled 2014-11-23: qty 3

## 2014-11-23 MED ORDER — VITAMIN D (ERGOCALCIFEROL) 1.25 MG (50000 UNIT) PO CAPS
50000.0000 [IU] | ORAL_CAPSULE | ORAL | Status: DC
  Administered 2014-11-23: 50000 [IU] via ORAL
  Filled 2014-11-23: qty 1

## 2014-11-23 MED ORDER — ACETAMINOPHEN 500 MG PO TABS: 1000.0000 mg | ORAL_TABLET | Freq: Two times a day (BID) | ORAL | Status: DC | PRN

## 2014-11-23 MED ORDER — PANTOPRAZOLE SODIUM 40 MG PO TBEC
40.0000 mg | DELAYED_RELEASE_TABLET | Freq: Every day | ORAL | Status: DC
  Administered 2014-11-23 – 2014-11-24 (×2): 40 mg via ORAL
  Filled 2014-11-23 (×3): qty 1

## 2014-11-23 MED ORDER — LAMOTRIGINE 100 MG PO TABS
100.0000 mg | ORAL_TABLET | Freq: Every day | ORAL | Status: DC
  Administered 2014-11-23: 100 mg via ORAL
  Filled 2014-11-23 (×2): qty 1

## 2014-11-23 NOTE — Progress Notes (Addendum)
Referral has been followed at: [facility] - per intake, referral process has to be made by attending physician [place] - per intake, no beds till tomorrow, "RN working on referrals right now." [facility] - left voicemail. [facility] - referral faxed. [facility] - referral faxed.  [facility] per Carlyon Shadow, only 1 geriatric bed open.  Declined at: [facility] - due to MR  CSW will continue to seek placement.  Verlon Setting, [facility] Disposition staff 11/23/2014 6:58 PM

## 2014-11-23 NOTE — ED Notes (Signed)
Pt resting with eyes closed, resp even and non labored, sitter remains at bedside,  

## 2014-11-23 NOTE — ED Notes (Signed)
Pharmacy staff to call [facility] this am to retrieve pt's medication list.

## 2014-11-23 NOTE — Progress Notes (Signed)
Pt referral faxed to the following facilities who report they are accepting referrals or have bed availability:  [facility]  Will continue seeking placement.  Peri Maris, [place] 11/23/2014 12:36 PM

## 2014-11-23 NOTE — ED Notes (Signed)
Pt awake, sitting semi folwer's in bed, sitter remains at bedside,

## 2014-11-23 NOTE — ED Notes (Signed)
Group home owner called and she was informed that we need the pt's MAR to enquire about his medication list.  Daniel Holmes [phone] Daniel Holmes- [phone] Group home after 4pm- [phone]

## 2014-11-23 NOTE — ED Notes (Signed)
Group home dropped off clothing for patient to take to [facility]. Clothing will be placed in pt's locker.

## 2014-11-23 NOTE — ED Notes (Signed)
Received report on pt, pt sitting in chair, sitter at bedside, new dressing placed to left elbow area per pt request,

## 2014-11-24 DIAGNOSIS — F70 Mild intellectual disabilities: Secondary | ICD-10-CM

## 2014-11-24 DIAGNOSIS — R45851 Suicidal ideations: Secondary | ICD-10-CM | POA: Diagnosis not present

## 2014-11-24 NOTE — Consult Note (Signed)
[facility] Telepsychiatry Consult   Reason for Consult:  Suicidal statements, scratching arm/neck Referring Physician:  EDP Patient Identification: Daniel Holmes MRN:  [id number] Principal Diagnosis: Mild intellectual disability Diagnosis:   Patient Active Problem List   Diagnosis Date Noted  . Suicidal ideation [R45.851]     Priority: High  . 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 Bipolar 1 disorder [F31.9] 11/08/2014  . 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 Bipolar disorder with depression [F31.30] 11/03/2014  . Arthritis [M19.90] 11/03/2014  . History of ADHD [Z86.59] 11/03/2014  . Allergic rhinitis [J30.9] 11/05/2012  . Hypothyroidism [E03.9] 03/28/2012    Total Time spent with patient: 35 minutes  Subjective:   Daniel Holmes is a 44 y.o. male patient admitted with reports of cutting himself with a butter knife, later clarified by group home staff that it was a shaving razor (plastic disposable). Pt seen and chart reviewed. Pt slow to respond with severe speech impediment, slurring, and delayed responses. However, pt was able to use complete sentences that were logical and linear. Pt reports that he wanted to harm himself because the group home staff were "talking about me" and that they were "wanting to hurt me". Pt reports that he feels this way often and does not want to return to the group home, if possible. Pt denies suicidal ideation today, stating that this has improved. Denies homicidal ideation and psychosis and does not appear to be responding to internal stimuli. Pt is adamant about not wanting to return to the home and reports that he will probably want to hurt himself if he has to go there again. Pt requesting help finding a new group home. Pt is calm, cooperative, alert/oriented x4, and answers questions appropriately. His responses are simplistic, yet rational and goal-directed.   This NP spoke to 3 group home staff members, the  first 2 of which had minimal information, but the last one being the administrator: "Latoya". Latoya informed this NP that pt was at a church function and went into the restroom and scratched his arm and neck with a razor. Abrasions were visible on camera although they did not appear to be lacerations. Pt does admit to scratching himself with the plastic razor that he found when he was upset. Latoya reports that pt has an intellectual disability and that they may have his IQ on file, which they will fax to [facility] if they can find it in their files, to assist with placement if necessary. However, pt appears to be at baseline and has been cooperative and appropriate in the ED.  [facility] notified about pt situation and multiple patients coming to ED from same group home with similar complaints; also notified about difficulty reaching group home staff to pick up pts when they are ready for discharge, sometimes necessitating 2-3 days of extra length-of-stay. At this time, this NP attempted to call the group home staff (all 3 phone numbers) at 2 different times, 30 minutes apart, with messages left, and no response was obtained. At this time, [facility] is awaiting a return phone call to discuss a group home representative visiting the ED to inform the pt that a new group home placement is being explored so that he can discharge home to the current group home today. Given pt's intellectual disability, reactionary responses, simplistic processing, no evidence of current psychosis or danger to self/others, he would likely not benefit from further institutionalization at this time.   HPI:  Daniel Holmes is a 44 y.o. male who voluntarily presents to [facility] with SI thoughts and depression x 5 mos. Pt reports the following: pt cut his neck and arm with razor blade, stating that he became upset with his caregivers at [employer] and states he was visiting [place] and staff members were on the phone making  threats about a pt. Pt was visiting group for church event and says he thought the phone called was about the people were trying threatening him or killing him.Pt states he's tried to harm himself 5x's in the past by cutting his wrists with a butter knife and has been hearing voices since his teens with command to harm himself. Pt was previously seen for the same thing at [facility], he tried to hang self with a belt. Pt has abrasions to neck and arm, wound has been dressed. Pt has speech   Pt has been to the ED 4 times in 6 months, most recently on the 19th of this month for similar complaints of wanting to harm himself secondary to not wanting to be at the group home.    HPI Elements:   Quality:  Threats to hurt himself if he has to go back to his group home. Frustration.. Severity:  Mild to moderate and unlikely to be life-threatening. Timing:  Recent just after a run in at the group home. Duration:  Chronic problem. Context:  Claims that they were cussing at him at the group home.  Past Medical History:  Past Medical History  Diagnosis Date  . Psychosis     See Dr Laverta Baltimore [name]  . Asthma     Past Surgical History  Procedure Laterality Date  . Hernia repair     Family History: No family history on file. Social History:  History  Alcohol Use No     History  Drug Use No    History   Social History  . Marital Status: Single    Spouse Name: N/A  . Number of Children: N/A  . Years of Education: N/A   Social History Main Topics  . Smoking status: Former Smoker -- 1.00 packs/day    Types: Cigarettes  . Smokeless tobacco: Not on file  . Alcohol Use: No  . Drug Use: No  . Sexual Activity: No   Other Topics Concern  . None   Social History Narrative   Lives in group home - [facility] in [place]   Additional Social History:    Pain Medications: See MAR  Prescriptions: See MAR  Over the Counter: See MAR  History of alcohol /  drug use?: No history of alcohol / drug abuse Longest period of sobriety (when/how long): None                      Allergies:   Allergies  Allergen Reactions  . Penicillins Swelling    Labs:  Results for orders placed or performed during the hospital encounter of 11/22/14 (from the past 48 hour(s))  Urine rapid drug screen (hosp performed)     Status: None   Collection Time: 11/22/14  6:49 PM  Result Value Ref Range   Opiates NONE DETECTED NONE DETECTED   Cocaine NONE DETECTED NONE DETECTED   Benzodiazepines NONE DETECTED NONE DETECTED   Amphetamines NONE DETECTED NONE DETECTED   Tetrahydrocannabinol NONE DETECTED NONE DETECTED   Barbiturates NONE DETECTED NONE DETECTED    Comment:  DRUG SCREEN FOR MEDICAL PURPOSES ONLY.  IF CONFIRMATION IS NEEDED FOR ANY PURPOSE, NOTIFY LAB WITHIN 5 DAYS.        LOWEST DETECTABLE LIMITS FOR URINE DRUG SCREEN Drug Class       Cutoff (ng/mL) Amphetamine      1000 Barbiturate      200 Benzodiazepine   423 Tricyclics       536 Opiates          300 Cocaine          300 THC              50   Acetaminophen level     Status: Abnormal   Collection Time: 11/23/14 12:00 AM  Result Value Ref Range   Acetaminophen (Tylenol), Serum <10 (L) 10 - 30 ug/mL    Comment:        THERAPEUTIC CONCENTRATIONS VARY SIGNIFICANTLY. A RANGE OF 10-30 ug/mL MAY BE AN EFFECTIVE CONCENTRATION FOR MANY PATIENTS. HOWEVER, SOME ARE BEST TREATED AT CONCENTRATIONS OUTSIDE THIS RANGE. ACETAMINOPHEN CONCENTRATIONS >150 ug/mL AT 4 HOURS AFTER INGESTION AND >50 ug/mL AT 12 HOURS AFTER INGESTION ARE OFTEN ASSOCIATED WITH TOXIC REACTIONS.   Salicylate level     Status: None   Collection Time: 11/23/14 12:00 AM  Result Value Ref Range   Salicylate Lvl <1.4 2.8 - 30.0 mg/dL  CBC with Differential/Platelet     Status: Abnormal   Collection Time: 11/23/14 12:00 AM  Result Value Ref Range   WBC 5.4 4.0 - 10.5 K/uL   RBC 3.88 (L) 4.22 - 5.81 MIL/uL    Hemoglobin 12.4 (L) 13.0 - 17.0 g/dL   HCT 36.3 (L) 39.0 - 52.0 %   MCV 93.6 78.0 - 100.0 fL   MCH 32.0 26.0 - 34.0 pg   MCHC 34.2 30.0 - 36.0 g/dL   RDW 12.1 11.5 - 15.5 %   Platelets 154 150 - 400 K/uL   Neutrophils Relative % 48 43 - 77 %   Neutro Abs 2.6 1.7 - 7.7 K/uL   Lymphocytes Relative 45 12 - 46 %   Lymphs Abs 2.4 0.7 - 4.0 K/uL   Monocytes Relative 6 3 - 12 %   Monocytes Absolute 0.3 0.1 - 1.0 K/uL   Eosinophils Relative 1 0 - 5 %   Eosinophils Absolute 0.1 0.0 - 0.7 K/uL   Basophils Relative 0 0 - 1 %   Basophils Absolute 0.0 0.0 - 0.1 K/uL  Comprehensive metabolic panel     Status: Abnormal   Collection Time: 11/23/14 12:00 AM  Result Value Ref Range   Sodium 139 135 - 145 mmol/L   Potassium 3.6 3.5 - 5.1 mmol/L   Chloride 109 101 - 111 mmol/L   CO2 23 22 - 32 mmol/L   Glucose, Bld 101 (H) 65 - 99 mg/dL   BUN 6 6 - 20 mg/dL   Creatinine, Ser 0.72 0.61 - 1.24 mg/dL   Calcium 8.9 8.9 - 10.3 mg/dL   Total Protein 6.4 (L) 6.5 - 8.1 g/dL   Albumin 4.1 3.5 - 5.0 g/dL   AST 23 15 - 41 U/L   ALT 17 17 - 63 U/L   Alkaline Phosphatase 68 38 - 126 U/L   Total Bilirubin 0.2 (L) 0.3 - 1.2 mg/dL   GFR calc non Af Amer >60 >60 mL/min   GFR calc Af Amer >60 >60 mL/min    Comment: (NOTE) The eGFR has been calculated using the CKD EPI equation. This calculation has not  been validated in all clinical situations. eGFR's persistently <60 mL/min signify possible Chronic Kidney Disease.    Anion gap 7 5 - 15    Vitals: Blood pressure 114/75, pulse 74, temperature 97.8 F (36.6 C), temperature source Oral, resp. rate 16, weight 79.379 kg (175 lb), SpO2 100 %.  Risk to Self: Suicidal Ideation: Yes-Currently Present Suicidal Intent: Yes-Currently Present Is patient at risk for suicide?: Yes Suicidal Plan?: Yes-Currently Present Specify Current Suicidal Plan: Cut self w/razor blade  Access to Means: Yes Specify Access to Suicidal Means: Sharps, razor blades  What has been  your use of drugs/alcohol within the last 12 months?: Denies  How many times?: 5 Other Self Harm Risks: Noen  Triggers for Past Attempts: Other personal contacts, Unpredictable Intentional Self Injurious Behavior: None Comment - Self Injurious Behavior: None  Risk to Others: Homicidal Ideation: No Thoughts of Harm to Others: No Current Homicidal Intent: No Current Homicidal Plan: No Access to Homicidal Means: No Identified Victim: None  History of harm to others?: No Assessment of Violence: None Noted Violent Behavior Description: None  Does patient have access to weapons?: No Criminal Charges Pending?: No Does patient have a court date: No Prior Inpatient Therapy: Prior Inpatient Therapy: Yes Prior Therapy Dates: 2016, 2008,2002,2001,  Prior Therapy Facilty/Provider(s): [facility] Reason for Treatment: Depression and Suicidal Gestures Prior Outpatient Therapy: Prior Outpatient Therapy: Yes Prior Therapy Dates: Current Prior Therapy Facilty/Provider(s): [facility]  Reason for Treatment: Bipolar/SI  Does patient have an ACCT team?: No Does patient have Intensive In-House Services?  : No Does patient have [facility] services? : No Does patient have P4CC services?: No  Current Facility-Administered Medications  Medication Dose Route Frequency Provider Last Rate Last Dose  . acetaminophen (TYLENOL) tablet 1,000 mg  1,000 mg Oral BID PRN Milton Ferguson, MD      . cloNIDine (CATAPRES) tablet 0.1 mg  0.1 mg Oral BID Milton Ferguson, MD   0.1 mg at 11/24/14 0951  . lamoTRIgine (LAMICTAL) tablet 100 mg  100 mg Oral QHS Milton Ferguson, MD   100 mg at 11/23/14 2116  . levothyroxine (SYNTHROID, LEVOTHROID) tablet 75 mcg  75 mcg Oral q morning - 10a Milton Ferguson, MD   75 mcg at 11/24/14 0950  . lithium carbonate (ESKALITH) CR tablet 450 mg  450 mg Oral Q12H Milton Ferguson, MD   450 mg at 11/24/14 0951  . multivitamin with minerals tablet 1 tablet  1 tablet Oral Daily Milton Ferguson, MD   1 tablet at  11/24/14 1104  . OLANZapine (ZYPREXA) tablet 30 mg  30 mg Oral QHS Milton Ferguson, MD   30 mg at 11/23/14 2116  . pantoprazole (PROTONIX) EC tablet 40 mg  40 mg Oral Daily Milton Ferguson, MD   40 mg at 11/24/14 0950  . traZODone (DESYREL) tablet 150 mg  150 mg Oral QHS Milton Ferguson, MD   150 mg at 11/23/14 2116  . Vitamin D (Ergocalciferol) (DRISDOL) capsule 50,000 Units  50,000 Units Oral Q14 Days Milton Ferguson, MD   50,000 Units at 11/23/14 1337   Current Outpatient Prescriptions  Medication Sig Dispense Refill  . acetaminophen (TYLENOL) 500 MG tablet Take 1,000 mg by mouth 2 (two) times daily as needed (knee pain).    . cloNIDine (CATAPRES) 0.1 MG tablet Take 1 tablet (0.1 mg total) by mouth 2 (two) times daily. 60 tablet 0  . lamoTRIgine (LAMICTAL) 25 MG tablet Take 100 mg by mouth at bedtime.    Marland Kitchen levothyroxine (SYNTHROID, LEVOTHROID)  75 MCG tablet TAKE (1) TABLET BY MOUTH ONCE DAILY BEFORE BREAKFAST. 30 tablet 6  . Multiple Vitamins-Minerals (MULTIVITAMIN ADULTS 50+) TABS Take 1 tablet by mouth daily. 30 tablet 0  . OLANZapine (ZYPREXA) 20 MG tablet Take 1.5 tablets (30 mg total) by mouth at bedtime. Take 1 and 1/2 tablets by mouth at bedtime. 45 tablet 0  . pantoprazole (PROTONIX) 40 MG tablet Take 1 tablet (40 mg total) by mouth daily. 30 tablet 0  . traZODone (DESYREL) 150 MG tablet Take 1 tablet (150 mg total) by mouth at bedtime. 30 tablet 0  . Vitamin D, Ergocalciferol, (DRISDOL) 50000 UNITS CAPS capsule Take 1 capsule (50,000 Units total) by mouth every 14 (fourteen) days. 2 capsule 0  . lithium carbonate (ESKALITH) 450 MG CR tablet Take 1 tablet (450 mg total) by mouth every 12 (twelve) hours. (Patient not taking: Reported on 11/23/2014) 60 tablet 0    Musculoskeletal: UTO, camera  Psychiatric Specialty Exam: Physical Exam  Psychiatric: His affect is blunt. His speech is delayed and slurred. Cognition and memory are impaired. He expresses impulsivity and inappropriate judgment.     Review of Systems  Constitutional: Negative.   HENT: Negative.   Eyes: Negative.   Respiratory: Negative.   Cardiovascular: Negative.   Gastrointestinal: Negative.   Musculoskeletal: Negative.   Skin: Negative.   Neurological: Negative.   Psychiatric/Behavioral: Positive for depression. Negative for suicidal ideas, hallucinations and substance abuse. The patient is nervous/anxious.     Blood pressure 114/75, pulse 74, temperature 97.8 F (36.6 C), temperature source Oral, resp. rate 16, weight 79.379 kg (175 lb), SpO2 100 %.Body mass index is 27.4 kg/(m^2).  General Appearance: Casual and Fairly Groomed  Eye Contact::  Good  Speech:  Garbled and Slurred  Volume:  Normal  Mood:  Euthymic  Affect:  Congruent  Thought Process:  Circumstantial and Loose  Orientation:  Full (Time, Place, and Person)  Thought Content:  Negative  Suicidal Thoughts:  No  Homicidal Thoughts:  No  Memory:  Immediate;   Fair Recent;   Fair Remote;   Poor  Judgement:  Impaired  Insight:  Shallow  Psychomotor Activity:  Normal  Concentration:  Fair  Recall:  [employer] of Knowledge:Poor  Language: Poor  Akathisia:  No  Handed:  Right  AIMS (if indicated):     Assets:  Financial Resources/Insurance Housing Social Support  ADL's:  Intact  Cognition: Impaired,  Moderate  Sleep:      Medical Decision Making: Established Problem, Stable/Improving (1), Review of Psycho-Social Stressors (1), Review or order clinical lab tests (1), Review of Medication Regimen & Side Effects (2) and Review of New Medication or Change in Dosage (2)  Treatment Plan Summary: Mild intellectual disability, stable for discharge to group home  Suicidal ideation; denies, stable for discharge  Plan:  Patient does not meet criteria for psychiatric inpatient admission. Supportive therapy provided about ongoing stressors.   Disposition: -Rescind IVC if in place -Discharge home to group home -Have representative from  group home come to console pt about exploring different group home options before taking him home today -Give outpatient resources for psychiatry/counseling   Benjamine Mola, FNP-BC 11/24/2014 12:15 PM

## 2014-11-24 NOTE — ED Notes (Signed)
Representatives from [facility] group [facility] at bedside to assess patient and determine if he is at baseline to return to their facility-[facility] in [place].

## 2014-11-24 NOTE — ED Notes (Signed)
RN spoke with Anders Simmonds, [phone], informed her that patient is to be d/c'd back to group home and SW will continue to seek new group home placement. Anders Simmonds stated that the group home manager, Avelina Laine,  who provides d/c transportation will be coming in at 3pm and she will call her and relay the message.

## 2014-11-24 NOTE — ED Provider Notes (Signed)
Patient was suicidal thoughts on admission now cleared by [facility] for discharge back to group home. They have made arrangements with the group home. They will be coming to get the patient sometime after 3:00 in the afternoon.  Fredia Sorrow, MD 11/24/14 1505

## 2014-11-24 NOTE — ED Notes (Signed)
RN and Avelina Laine at bedside to talk with patient about d/c back to group home until he can be placed at a new group home. Pt verbalizes understanding and is in agreement.

## 2014-11-24 NOTE — ED Notes (Signed)
TSS repeated this am.

## 2014-11-24 NOTE — Progress Notes (Addendum)
CSW spoke with Lavon Paganini [phone]), Care Coordinator at [facility]. She reports she is the Care Coordiantor for Mr. Azizi. CSW briefed her on his recentt hospitalizations and ER visits including suicide attempts and patient vocalizing his wish to not stay at the group home where he resides. Ms. Bobbye Charleston acknowledges concerns and will follow up to assist with potential alternative housing/group home options.    Eduard Clos, MSW, [place]

## 2014-11-24 NOTE — Discharge Instructions (Signed)
Cleared by [facility] for discharge back to group home.

## 2014-11-24 NOTE — ED Notes (Signed)
Pt resting with eyes closed, resp even and non labored, sitter remains at bedside,  

## 2014-12-02 ENCOUNTER — Encounter: Payer: Self-pay | Admitting: Family Medicine

## 2014-12-02 ENCOUNTER — Ambulatory Visit (INDEPENDENT_AMBULATORY_CARE_PROVIDER_SITE_OTHER): Payer: Medicaid Other | Admitting: Family Medicine

## 2014-12-02 DIAGNOSIS — M1990 Unspecified osteoarthritis, unspecified site: Secondary | ICD-10-CM | POA: Diagnosis not present

## 2014-12-02 MED ORDER — TROLAMINE SALICYLATE 10 % EX CREA: 1.0000 "application " | TOPICAL_CREAM | Freq: Four times a day (QID) | CUTANEOUS | Status: DC | PRN

## 2014-12-02 NOTE — Assessment & Plan Note (Addendum)
R knee worsened.  Will check xray.   Use aspercreme as he is on a large number of medications

## 2014-12-02 NOTE — Progress Notes (Signed)
   Subjective:    Patient ID: Daniel Holmes, male    DOB: 10-10-1970, 44 y.o.   MRN: [id number]  HPI  Knee pain - Left > right.  Worse with movement.  No soft tissue swelling or redness or giving out.  Using knee brace not helping.  Doing exercises.  Tylenol twice daily not helping.  No other joint pain or rashes  Suicidal Ideation - none since released from [facility] last week  Did not bring in his medications   Chief Complaint noted Review of Symptoms - see HPI PMH - Smoking status noted.   Vital Signs reviewed    Review of Systems     Objective:   Physical Exam  Alert no acute distress Heart - Regular rate and rhythm.  No murmurs, gallops or rubs.    Lungs:  Normal respiratory effort, chest expands symmetrically. Lungs are clear to auscultation, no crackles or wheezes. Extremities:  No cyanosis, edema, or deformity noted with good range of motion of all major joints.  Does have pain with flex exten in left knee.  No laxity effusion or redness.  Mild crepitus        Assessment & Plan:

## 2014-12-02 NOTE — Patient Instructions (Addendum)
Good to see you today!  Thanks for coming in.  Come back in 3 months - BRING ALL MEDS  And BRACE  I will contact you if your xray needs to change your meds - [phone]  - Anner Crete   I would use Aspercreme on your knee up to 4 x a day

## 2014-12-10 ENCOUNTER — Other Ambulatory Visit: Payer: Self-pay | Admitting: Family Medicine

## 2014-12-10 ENCOUNTER — Ambulatory Visit
Admission: RE | Admit: 2014-12-10 | Discharge: 2014-12-10 | Disposition: A | Discharge status: Home / Self Care | Payer: Medicaid Other | Source: Ambulatory Visit | Attending: Family Medicine | Admitting: Family Medicine

## 2014-12-10 DIAGNOSIS — M1990 Unspecified osteoarthritis, unspecified site: Secondary | ICD-10-CM

## 2015-01-16 ENCOUNTER — Encounter: Payer: Self-pay | Admitting: Emergency Medicine

## 2015-01-16 ENCOUNTER — Emergency Department
Admission: EM | Admit: 2015-01-16 | Discharge: 2015-01-16 | Disposition: A | Discharge status: Home / Self Care | Payer: Medicaid Other | Attending: Emergency Medicine | Admitting: Emergency Medicine

## 2015-01-16 DIAGNOSIS — R451 Restlessness and agitation: Secondary | ICD-10-CM | POA: Insufficient documentation

## 2015-01-16 DIAGNOSIS — Z880 Allergy status to penicillin: Secondary | ICD-10-CM | POA: Insufficient documentation

## 2015-01-16 DIAGNOSIS — Z79899 Other long term (current) drug therapy: Secondary | ICD-10-CM | POA: Diagnosis not present

## 2015-01-16 DIAGNOSIS — K08109 Complete loss of teeth, unspecified cause, unspecified class: Secondary | ICD-10-CM | POA: Diagnosis not present

## 2015-01-16 DIAGNOSIS — Z046 Encounter for general psychiatric examination, requested by authority: Secondary | ICD-10-CM | POA: Diagnosis present

## 2015-01-16 DIAGNOSIS — Z87891 Personal history of nicotine dependence: Secondary | ICD-10-CM | POA: Insufficient documentation

## 2015-01-16 LAB — CBC WITH DIFFERENTIAL/PLATELET
Basophils Absolute: 0 10*3/uL (ref 0–0.1)
Basophils Relative: 0 %
Eosinophils Absolute: 0.1 10*3/uL (ref 0–0.7)
Eosinophils Relative: 1 %
HEMATOCRIT: 36.6 % — AB (ref 40.0–52.0)
HEMOGLOBIN: 12.3 g/dL — AB (ref 13.0–18.0)
LYMPHS PCT: 29 %
Lymphs Abs: 1.6 10*3/uL (ref 1.0–3.6)
MCH: 31.3 pg (ref 26.0–34.0)
MCHC: 33.6 g/dL (ref 32.0–36.0)
MCV: 93.2 fL (ref 80.0–100.0)
MONOS PCT: 4 %
Monocytes Absolute: 0.2 10*3/uL (ref 0.2–1.0)
NEUTROS ABS: 3.5 10*3/uL (ref 1.4–6.5)
NEUTROS PCT: 66 %
Platelets: 186 10*3/uL (ref 150–440)
RBC: 3.92 MIL/uL — ABNORMAL LOW (ref 4.40–5.90)
RDW: 12.4 % (ref 11.5–14.5)
WBC: 5.3 10*3/uL (ref 3.8–10.6)

## 2015-01-16 LAB — URINE DRUG SCREEN, QUALITATIVE (ARMC ONLY)
AMPHETAMINES, UR SCREEN: NOT DETECTED
BENZODIAZEPINE, UR SCRN: NOT DETECTED
Barbiturates, Ur Screen: NOT DETECTED
Cannabinoid 50 Ng, Ur ~~LOC~~: NOT DETECTED
Cocaine Metabolite,Ur ~~LOC~~: NOT DETECTED
MDMA (Ecstasy)Ur Screen: NOT DETECTED
Methadone Scn, Ur: NOT DETECTED
Opiate, Ur Screen: NOT DETECTED
Phencyclidine (PCP) Ur S: NOT DETECTED
Tricyclic, Ur Screen: NOT DETECTED

## 2015-01-16 LAB — COMPREHENSIVE METABOLIC PANEL
ALK PHOS: 129 U/L — AB (ref 38–126)
ALT: 20 U/L (ref 17–63)
ANION GAP: 8 (ref 5–15)
AST: 32 U/L (ref 15–41)
Albumin: 4.6 g/dL (ref 3.5–5.0)
BUN: 10 mg/dL (ref 6–20)
CALCIUM: 9.7 mg/dL (ref 8.9–10.3)
CO2: 23 mmol/L (ref 22–32)
Chloride: 108 mmol/L (ref 101–111)
Creatinine, Ser: 0.98 mg/dL (ref 0.61–1.24)
GFR calc Af Amer: >60 mL/min (ref >60)
Glucose, Bld: 102 mg/dL — ABNORMAL HIGH (ref 65–99)
POTASSIUM: 3.8 mmol/L (ref 3.5–5.1)
Sodium: 139 mmol/L (ref 135–145)
Total Bilirubin: 0.2 mg/dL — ABNORMAL LOW (ref 0.3–1.2)
Total Protein: 7.1 g/dL (ref 6.5–8.1)

## 2015-01-16 LAB — ETHANOL

## 2015-01-16 NOTE — ED Notes (Signed)
BEHAVIORAL HEALTH ROUNDING Patient sleeping: Yes.   Patient alert and oriented: yes Behavior appropriate: Yes.  ; If no, describe:  Nutrition and fluids offered: Yes  Toileting and hygiene offered: Yes  Sitter present: No Law enforcement present: Yes

## 2015-01-16 NOTE — ED Notes (Signed)
Pt has not stopped talking since he arrived.  He is sitting sideways on the stretcher, leaning his back against the wall.  He is talking to anyone who will listen to him.

## 2015-01-16 NOTE — ED Notes (Signed)

## 2015-01-16 NOTE — ED Notes (Signed)
Pt arrives ED Quad 20 hallway.

## 2015-01-16 NOTE — ED Provider Notes (Signed)
[facility] Emergency Department Provider Note  Time seen: 1:29 PM  I have reviewed the triage vital signs and the nursing notes.   HISTORY  Chief Complaint Psychiatric Evaluation    HPI Daniel Holmes is a 44 y.o. male with a past medical history of asthma, behavioral disorder, presents the emergency department from his group home for agitation. According to the report patient became agitated and at a group home member who would not let him wash his sheets. He told him he just wants to leave the group home and he wants to go to a new group home. Patient then put a belt around his neck, so the group home wouldn't call EMS. Denies ever hitting himself, denies any suicidal intent. The patient states he just wanted them to call the police so he could leave the group home. Denies any SI or HI. Denies any medical complaints today.    Past Medical History  Diagnosis Date  . Psychosis     See Dr Laverta [place] [name]  . Asthma     Patient Active Problem List   Diagnosis Date Noted  . Suicidal ideation   . Bipolar 1 disorder 11/08/2014  . 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 Bipolar disorder with depression 11/03/2014  . Arthritis 11/03/2014  . 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 History of ADHD 11/03/2014  . Allergic rhinitis 11/05/2012  . Hypothyroidism 03/28/2012    Past Surgical History  Procedure Laterality Date  . Hernia repair      Current Outpatient Rx  Name  Route  Sig  Dispense  Refill  . acetaminophen (TYLENOL) 500 MG tablet   Oral   Take 1,000 mg by mouth 2 (two) times daily as needed (knee pain).         . cloNIDine (CATAPRES) 0.1 MG tablet   Oral   Take 1 tablet (0.1 mg total) by mouth 2 (two) times daily.   60 tablet   0   . lamoTRIgine (LAMICTAL) 25 MG tablet   Oral   Take 100 mg by mouth at bedtime.         Marland Kitchen levothyroxine (SYNTHROID, LEVOTHROID) 75 MCG tablet      TAKE (1) TABLET BY MOUTH  ONCE DAILY BEFORE BREAKFAST.   30 tablet   6     Dispense as written.   . lithium carbonate (ESKALITH) 450 MG CR tablet   Oral   Take 1 tablet (450 mg total) by mouth every 12 (twelve) hours. Patient not taking: Reported on 11/23/2014   60 tablet   0   . Multiple Vitamins-Minerals (MULTIVITAMIN ADULTS 50+) TABS   Oral   Take 1 tablet by mouth daily.   30 tablet   0   . OLANZapine (ZYPREXA) 20 MG tablet   Oral   Take 1.5 tablets (30 mg total) by mouth at bedtime. Take 1 and 1/2 tablets by mouth at bedtime.   45 tablet   0   . pantoprazole (PROTONIX) 40 MG tablet   Oral   Take 1 tablet (40 mg total) by mouth daily.   30 tablet   0   . traZODone (DESYREL) 150 MG tablet   Oral   Take 1 tablet (150 mg total) by mouth at bedtime.   30 tablet   0   . trolamine salicylate (ASPERCREME/ALOE) 10 % cream   Topical   Apply 1 application topically 4 (four) times daily as needed for muscle pain.   85 g  3   . Vitamin D, Ergocalciferol, (DRISDOL) 50000 UNITS CAPS capsule   Oral   Take 1 capsule (50,000 Units total) by mouth every 14 (fourteen) days.   2 capsule   0     Allergies Penicillins  No family history on file.  Social History Social History  Substance Use Topics  . Smoking status: Former Smoker -- 1.00 packs/day    Types: Cigarettes  . Smokeless tobacco: None  . Alcohol Use: No    Review of Systems Constitutional: Negative for fever Cardiovascular: Negative for chest pain. Respiratory: Negative for shortness of breath. Gastrointestinal: Negative for abdominal pain Neurological: Negative for headache 10-point ROS otherwise negative.  ____________________________________________   PHYSICAL EXAM:  VITAL SIGNS: ED Triage Vitals  Enc Vitals Group     BP 01/16/15 1234 103/69 mmHg     Pulse Rate 01/16/15 1234 91     Resp 01/16/15 1234 18     Temp 01/16/15 1234 97.8 F (36.6 C)     Temp Source 01/16/15 1234 Oral     SpO2 01/16/15 1234 95 %      Weight 01/16/15 1234 170 lb (77.111 kg)     Height 01/16/15 1234 5\' 8"  (1.727 m)     Head Cir --      Peak Flow --      Pain Score --      Pain Loc --      Pain Edu? --      Excl. in [place]? --     Constitutional: Alert and oriented. Well appearing and in no distress. Eyes: Normal exam ENT   Head: Normocephalic and atraumatic   Mouth/Throat: Edentulous Cardiovascular: Normal rate, regular rhythm. Respiratory: Normal respiratory effort without tachypnea nor retractions. Breath sounds are clear and equal bilaterally. No wheezes/rales/rhonchi. Gastrointestinal: Soft and nontender. No distention.   Musculoskeletal: Nontender with normal range of motion in all extremities.  Neurologic:  Normal speech and language. No gross focal neurologic deficits Skin:  Skin is warm, dry and intact.  Psychiatric: Mood and affect are normal. Speech and behavior are normal. Denies SI or HI.  ____________________________________________   INITIAL IMPRESSION / ASSESSMENT AND PLAN / ED COURSE  Pertinent labs & imaging results that were available during my care of the patient were reviewed by me and considered in my medical decision making (see chart for details).  Patient presents from a group home after a verbal altercation. Patient states he was upset so he put his belt around his neck so they would call EMS. He denies any SI or HI. Patient states he just wants to leave a group home and go to a new group home. We will have the patient seen by psychiatry. Currently the patient appears very well, no SI no HI, appears to be rational and I do not believe the patient is an imminent threat to himself or anybody else. We will leave the patient voluntary at this time, and have psychiatry evaluate.  ____________________________________________   FINAL CLINICAL IMPRESSION(S) / ED DIAGNOSES  Agitation   Harvest Dark, MD 01/16/15 [id number]

## 2015-01-16 NOTE — BH Assessment (Signed)
Assessment Note  Daniel Holmes is an 44 y.o. male. Due to patient's speech impediment, this writer had great difficulty understanding information provided. Pt attempted to state someone at the group home made him upset. This writer was unable to understand if client was stating he tied a belt around his neck when he became upset with this individual. Pt provided non-verbal responses to interview questions. Pt was accompanied by his group home staff member who provided information regarding events that led to pt being transported to ED.    Group home staff member Joice Lofts: [phone]) reports "when I got there they said he tried to hang himself. He took his Bipolar meds. He says he doesn't want to be at the group home. He probably don't like the staff". Staff member further reports pt has Hx of cutting his arms. Pt has no contact with family supports since being in the group home. Pt denied SI/HI when asked by this Probation officer; however, he reports Hx of thoughts to harm himself.  Axis I: Psychotic Disorder NOS Axis II: Deferred Axis III:  Past Medical History  Diagnosis Date  . Psychosis     See Dr Laverta [name]  . Asthma    Axis IV: housing problems and problems with primary support group Axis V: 31-40 impairment in reality testing  Past Medical History:  Past Medical History  Diagnosis Date  . Psychosis     See Dr Laverta [name]  . Asthma     Past Surgical History  Procedure Laterality Date  . Hernia repair      Family History: History reviewed. No pertinent family history.  Social History:  reports that he has quit smoking. His smoking use included Cigarettes. He smoked 1.00 pack per day. He does not have any smokeless tobacco history on file. He reports that he does not drink alcohol or use illicit drugs.  Additional Social History:  Alcohol / Drug Use History of alcohol / drug use?: No history of alcohol / drug abuse  CIWA:  CIWA-Ar BP: 101/66 mmHg Pulse Rate: 87 COWS:    Allergies:  Allergies  Allergen Reactions  . Penicillins Swelling    Home Medications:  (Not in a hospital admission)  OB/GYN Status:  No LMP for male patient.  General Assessment Data Location of Assessment: [facility] ED TTS Assessment: In system Is this a Tele or Face-to-Face Assessment?: Face-to-Face Is this an Initial Assessment or a Re-assessment for this encounter?: Initial Assessment Marital status: Single Maiden name: None Is patient pregnant?: No Pregnancy Status: No Living Arrangements: Group Home ([place]) Can pt return to current living arrangement?: Yes Admission Status: Involuntary Is patient capable of signing voluntary admission?: No Referral Source: Other (Group Home) Insurance type: Medicaid  Medical Screening Exam ([place]) Medical Exam completed: Yes  Crisis Care Plan Living Arrangements: Group Home ([place]) Name of Psychiatrist: Unknown Name of Therapist: Unknown  Education Status Is patient currently in school?: No Current Grade: None Highest grade of school patient has completed: None Name of school: None Contact person: None  Risk to self with the past 6 months Suicidal Ideation:  (Yes, Per report of Group Home staff) Has patient been a risk to self within the past 6 months prior to admission? : Yes Suicidal Intent: No-Not Currently/Within Last 6 Months Has patient had any suicidal intent within the past 6 months prior to admission? : No Is patient at risk for suicide?: No  Suicidal Plan?: No-Not Currently/Within Last 6 Months (Staff reports pt tried to tie a belt around his neck) Has patient had any suicidal plan within the past 6 months prior to admission? : Yes Specify Current Suicidal Plan: None Access to Means: Yes Specify Access to Suicidal Means: Belt What has been your use of drugs/alcohol within the last 12 months?: None Reported Previous  Attempts/Gestures: Yes How many times?:  (Unable to gather information) Other Self Harm Risks: None Triggers for Past Attempts: Unknown Intentional Self Injurious Behavior: Cutting Comment - Self Injurious Behavior:  (Past behaviors of cutting) Family Suicide History: Unknown Recent stressful life event(s): Other (Comment) (Issues with housing) Persecutory voices/beliefs?: No Depression: No (Denied) Depression Symptoms:  (Denied; Ct reports he became upset at the goup home.) Substance abuse history and/or treatment for substance abuse?: No Suicide prevention information given to non-admitted patients: Yes  Risk to Others within the past 6 months Homicidal Ideation: No Does patient have any lifetime risk of violence toward others beyond the six months prior to admission? : No Thoughts of Harm to Others: No Current Homicidal Intent: No Current Homicidal Plan: No Access to Homicidal Means: No Identified Victim:  (None) History of harm to others?: No Assessment of Violence: None Noted Violent Behavior Description:  (None) Does patient have access to weapons?: No Criminal Charges Pending?: No Does patient have a court date: No Is patient on probation?: No  Psychosis Hallucinations:  (Unable to Assess) Delusions:  (Unable to Assess)  Mental Status Report Appearance/Hygiene: In scrubs, In hospital gown Eye Contact: Good Motor Activity: Restlessness Speech: Logical/coherent (Speech impediment) Level of Consciousness: Alert Mood: Guilty Affect: Flat Anxiety Level: Minimal Thought Processes: Coherent, Relevant Judgement: Unable to Assess Orientation: Person, Time, Place, Situation Obsessive Compulsive Thoughts/Behaviors: None  Cognitive Functioning Concentration: Normal Memory: Recent Intact, Remote Intact IQ: Below Average Level of Function: Unknown Insight: Poor Impulse Control: Poor Appetite: Good Weight Loss: 0 Weight Gain: 0 Sleep: Unable to Assess Total Hours of  Sleep:  (Unable to Assess) Vegetative Symptoms: None  ADLScreening [facility]) Patient's cognitive ability adequate to safely complete daily activities?: Yes Patient able to express need for assistance with ADLs?: Yes Independently performs ADLs?: Yes (appropriate for developmental age)  Prior Inpatient Therapy Prior Inpatient Therapy: Yes Prior Therapy Dates: 2016, 2008,2002,2001,  Prior Therapy Facilty/Provider(s): [place] Reason for Treatment: Depression and Suicidal Gestures  Prior Outpatient Therapy Prior Outpatient Therapy:  (Unknown) Does patient have an ACCT team?: No Does patient have Intensive In-House Services?  : No Does patient have [facility] services? : No Does patient have P4CC services?: No  ADL Screening (condition at time of admission) Patient's cognitive ability adequate to safely complete daily activities?: Yes Patient able to express need for assistance with ADLs?: Yes Independently performs ADLs?: Yes (appropriate for developmental age)       Abuse/Neglect Assessment (Assessment to be complete while patient is alone) Physical Abuse: Denies Verbal Abuse: Denies Sexual Abuse: Denies Exploitation of patient/patient's resources: Denies Self-Neglect: Denies Values / Beliefs Cultural Requests During Hospitalization: None Spiritual Requests During Hospitalization: None Consults Spiritual Care Consult Needed: No Social Work Consult Needed: No Regulatory affairs officer (For Healthcare) Does patient have an advance directive?: No Would patient like information on creating an advanced directive?: No - patient declined information    Additional Information 1:1 In Past 12 Months?: No CIRT Risk: No Elopement Risk: No Does patient have medical clearance?: Yes  Child/Adolescent Assessment Running Away Risk: Denies Bed-Wetting: Denies Destruction of Property: Denies Cruelty to  Animals: Denies Stealing: Denies Rebellious/Defies Authority:  Denies Satanic Involvement: Denies Science writer: Denies Problems at [employer]: Denies Gang Involvement: Denies  Disposition:  Disposition Initial Assessment Completed for this Encounter: Yes Disposition of Patient: Referred to (Psych MD) Type of inpatient treatment program: Adult Type of outpatient treatment: Adult  On Site Evaluation by:   Reviewed with Physician:    Frederich Cha 01/16/2015 7:19 PM

## 2015-01-16 NOTE — Discharge Instructions (Signed)

## 2015-01-16 NOTE — ED Notes (Signed)
Pt has no teeth and he appears to have a speech impediment.  Consequently, it is difficult to understand what the pt is saying.  Pt is accompanied by Joice Lofts, a support professional from pt's group home, [facility] ([address] [address]).  Director Ms Caren Griffins.   Pt states that he wanted to moved to another group home because he was angry with the staff at his current group home.  He asked to be able to wash his bed cover but a staff person told him no.  He said he would wash it himself and he was told "hell no."  Pt went to his room and put a belt loosely around his neck.  Staff observed pt, called the police and pt was bought here.  Pt states that he does not and did not intend to kill himself.  He just wants to move to a new group home.

## 2015-01-16 NOTE — ED Notes (Signed)
BEHAVIORAL HEALTH ROUNDING Patient sleeping: No. Patient alert and oriented: alert; not oriented Behavior appropriate: Yes.  ; If no, describe:  Nutrition and fluids offered: Yes  Toileting and hygiene offered: Yes  Sitter present: not applicable Law enforcement present: Yes

## 2015-01-16 NOTE — ED Notes (Addendum)
Pt continues to talk, non-stop, to anyone and everyone.  His one-to-one support person, Zenia Resides, left for a while to eat, but has returned.  When Zenia Resides is here, pt speaks constantly and primarily to him.  When Zenia Resides is not here, pt speaks to anyone else who will engage him.

## 2015-01-16 NOTE — ED Notes (Signed)
Denies SI or HI 

## 2015-01-16 NOTE — ED Notes (Signed)
Dinner served

## 2015-01-16 NOTE — ED Notes (Addendum)
Pt wants to know if he can leave.  Pt told by RN that he must see a psychiatrist before leaving.

## 2015-01-16 NOTE — ED Provider Notes (Signed)
-----------------------------------------   8:34 PM on 01/16/2015 -----------------------------------------   Blood pressure 101/66, pulse 87, temperature 98.6 F (37 C), temperature source Oral, resp. rate 18, height 5\' 6"  (1.676 m), weight 140 lb (63.504 kg), SpO2 97 %.  The patient was evaluated by TTS who felt that he was appropriate to go home.  The group home representative is here and also has not concerned and is happy to take him home.  He has been calm and talkative but not exhibiting any aggressive behavior.  I talked to the patient and the group home representative and the patient is very adamant that he has no intention to hurt himself or anyone else.  I asked the group home representative if he is comfortable with that and he said that he is.  Given all the positive information that I have at this time, we will discharge the patient for outpatient follow-up.  I discontinued the psych consult order.  Hinda Kehr, MD 01/16/15 2035

## 2015-02-16 ENCOUNTER — Emergency Department (HOSPITAL_COMMUNITY): Payer: Medicaid Other

## 2015-02-16 ENCOUNTER — Emergency Department (HOSPITAL_COMMUNITY)
Admission: EM | Admit: 2015-02-16 | Discharge: 2015-03-02 | Disposition: A | Discharge status: Home / Self Care | Payer: Medicaid Other | Attending: Emergency Medicine | Admitting: Emergency Medicine

## 2015-02-16 ENCOUNTER — Encounter (HOSPITAL_COMMUNITY): Payer: Self-pay | Admitting: Emergency Medicine

## 2015-02-16 DIAGNOSIS — S92412A Displaced fracture of proximal phalanx of left great toe, initial encounter for closed fracture: Secondary | ICD-10-CM | POA: Insufficient documentation

## 2015-02-16 DIAGNOSIS — S92912A Unspecified fracture of left toe(s), initial encounter for closed fracture: Secondary | ICD-10-CM

## 2015-02-16 DIAGNOSIS — X58XXXA Exposure to other specified factors, initial encounter: Secondary | ICD-10-CM | POA: Diagnosis not present

## 2015-02-16 DIAGNOSIS — S0181XA Laceration without foreign body of other part of head, initial encounter: Secondary | ICD-10-CM | POA: Diagnosis not present

## 2015-02-16 DIAGNOSIS — Y998 Other external cause status: Secondary | ICD-10-CM | POA: Insufficient documentation

## 2015-02-16 DIAGNOSIS — Z79899 Other long term (current) drug therapy: Secondary | ICD-10-CM | POA: Insufficient documentation

## 2015-02-16 DIAGNOSIS — Z87891 Personal history of nicotine dependence: Secondary | ICD-10-CM | POA: Diagnosis not present

## 2015-02-16 DIAGNOSIS — J45909 Unspecified asthma, uncomplicated: Secondary | ICD-10-CM | POA: Diagnosis not present

## 2015-02-16 DIAGNOSIS — F70 Mild intellectual disabilities: Secondary | ICD-10-CM | POA: Diagnosis present

## 2015-02-16 DIAGNOSIS — T1491XA Suicide attempt, initial encounter: Secondary | ICD-10-CM

## 2015-02-16 DIAGNOSIS — Z880 Allergy status to penicillin: Secondary | ICD-10-CM | POA: Diagnosis not present

## 2015-02-16 DIAGNOSIS — Y9289 Other specified places as the place of occurrence of the external cause: Secondary | ICD-10-CM | POA: Insufficient documentation

## 2015-02-16 DIAGNOSIS — T1491 Suicide attempt: Secondary | ICD-10-CM | POA: Insufficient documentation

## 2015-02-16 DIAGNOSIS — Y9389 Activity, other specified: Secondary | ICD-10-CM | POA: Diagnosis not present

## 2015-02-16 HISTORY — DX: Developmental disorder of scholastic skills, unspecified: F81.9

## 2015-02-16 LAB — RAPID URINE DRUG SCREEN, HOSP PERFORMED
Amphetamines: NOT DETECTED
BENZODIAZEPINES: NOT DETECTED
Barbiturates: NOT DETECTED
Cocaine: NOT DETECTED
Opiates: NOT DETECTED
Tetrahydrocannabinol: NOT DETECTED

## 2015-02-16 LAB — BASIC METABOLIC PANEL
Anion gap: 7 (ref 5–15)
BUN: 7 mg/dL (ref 6–20)
CO2: 26 mmol/L (ref 22–32)
CREATININE: 0.81 mg/dL (ref 0.61–1.24)
Calcium: 10.3 mg/dL (ref 8.9–10.3)
Chloride: 108 mmol/L (ref 101–111)
GFR calc Af Amer: >60 mL/min (ref >60)
Glucose, Bld: 111 mg/dL — ABNORMAL HIGH (ref 65–99)
POTASSIUM: 4.1 mmol/L (ref 3.5–5.1)
SODIUM: 141 mmol/L (ref 135–145)

## 2015-02-16 LAB — CBC
HCT: 38.8 % — ABNORMAL LOW (ref 39.0–52.0)
Hemoglobin: 13.5 g/dL (ref 13.0–17.0)
MCH: 32 pg (ref 26.0–34.0)
MCHC: 34.8 g/dL (ref 30.0–36.0)
MCV: 91.9 fL (ref 78.0–100.0)
PLATELETS: 178 10*3/uL (ref 150–400)
RBC: 4.22 MIL/uL (ref 4.22–5.81)
RDW: 12.5 % (ref 11.5–15.5)
WBC: 10.7 10*3/uL — ABNORMAL HIGH (ref 4.0–10.5)

## 2015-02-16 LAB — ETHANOL

## 2015-02-16 MED ORDER — LEVOTHYROXINE SODIUM 75 MCG PO TABS
75.0000 ug | ORAL_TABLET | Freq: Every day | ORAL | Status: DC
  Administered 2015-02-17 – 2015-03-02 (×14): 75 ug via ORAL
  Filled 2015-02-16 (×20): qty 1

## 2015-02-16 MED ORDER — LIDOCAINE HCL (PF) 1 % IJ SOLN
5.0000 mL | Freq: Once | INTRAMUSCULAR | Status: AC
  Administered 2015-02-16: 5 mL via INTRADERMAL
  Filled 2015-02-16: qty 5

## 2015-02-16 MED ORDER — LAMOTRIGINE 100 MG PO TABS
100.0000 mg | ORAL_TABLET | Freq: Every day | ORAL | Status: DC
  Administered 2015-02-16 – 2015-03-01 (×14): 100 mg via ORAL
  Filled 2015-02-16 (×14): qty 1

## 2015-02-16 MED ORDER — ACETAMINOPHEN 500 MG PO TABS
1000.0000 mg | ORAL_TABLET | Freq: Two times a day (BID) | ORAL | Status: DC | PRN
  Administered 2015-02-16 – 2015-02-18 (×5): 1000 mg via ORAL
  Filled 2015-02-16 (×5): qty 2

## 2015-02-16 MED ORDER — LITHIUM CARBONATE ER 450 MG PO TBCR
450.0000 mg | EXTENDED_RELEASE_TABLET | Freq: Two times a day (BID) | ORAL | Status: DC
  Administered 2015-02-16 – 2015-03-02 (×28): 450 mg via ORAL
  Filled 2015-02-16 (×32): qty 1

## 2015-02-16 MED ORDER — TETANUS-DIPHTH-ACELL PERTUSSIS 5-2.5-18.5 LF-MCG/0.5 IM SUSP
0.5000 mL | Freq: Once | INTRAMUSCULAR | Status: AC
  Administered 2015-02-16: 0.5 mL via INTRAMUSCULAR
  Filled 2015-02-16: qty 0.5

## 2015-02-16 MED ORDER — TRAZODONE HCL 50 MG PO TABS
150.0000 mg | ORAL_TABLET | Freq: Every day | ORAL | Status: DC
  Administered 2015-02-16 – 2015-03-01 (×13): 150 mg via ORAL
  Filled 2015-02-16 (×25): qty 1

## 2015-02-16 MED ORDER — LIDOCAINE-EPINEPHRINE-TETRACAINE (LET) SOLUTION
3.0000 mL | Freq: Once | NASAL | Status: AC
  Administered 2015-02-16: 3 mL via TOPICAL
  Filled 2015-02-16: qty 3

## 2015-02-16 MED ORDER — OLANZAPINE 5 MG PO TABS
30.0000 mg | ORAL_TABLET | Freq: Every day | ORAL | Status: DC
  Administered 2015-02-17 – 2015-03-02 (×14): 30 mg via ORAL
  Filled 2015-02-16 (×14): qty 6

## 2015-02-16 MED ORDER — CLONIDINE HCL 0.1 MG PO TABS
0.1000 mg | ORAL_TABLET | Freq: Two times a day (BID) | ORAL | Status: DC
  Administered 2015-02-16 – 2015-03-02 (×24): 0.1 mg via ORAL
  Filled 2015-02-16 (×28): qty 1
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
## 2015-02-16 NOTE — Progress Notes (Signed)
Orthopedic Tech Progress Note Patient Details:  Daniel Holmes 04-Nov-1970 [id number]  Ortho Devices Type of Ortho Device: Post (short leg) splint, Ace wrap Ortho Device/Splint Interventions: Application   Cammer, Theodoro Parma 02/16/2015, 3:10 PM
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
## 2015-02-16 NOTE — Discharge Instructions (Signed)
Facial Laceration ° A facial laceration is a cut on the face. These injuries can be painful and cause bleeding. Lacerations usually heal quickly, but they need special care to reduce scarring. °DIAGNOSIS  °Your health care provider will take a medical history, ask for details about how the injury occurred, and examine the wound to determine how deep the cut is. °TREATMENT  °Some facial lacerations may not require closure. Others may not be able to be closed because of an increased risk of infection. The risk of infection and the chance for successful closure will depend on various factors, including the amount of time since the injury occurred. °The wound may be cleaned to help prevent infection. If closure is appropriate, pain medicines may be given if needed. Your health care provider will use stitches (sutures), wound glue (adhesive), or skin adhesive strips to repair the laceration. These tools bring the skin edges together to allow for faster healing and a better cosmetic outcome. If needed, you may also be given a tetanus shot. °HOME CARE INSTRUCTIONS °· Only take over-the-counter or prescription medicines as directed by your health care provider. °· Follow your health care provider's instructions for wound care. These instructions will vary depending on the technique used for closing the wound. °For Sutures: °· Keep the wound clean and dry.   °· If you were given a bandage (dressing), you should change it at least once a day. Also change the dressing if it becomes wet or dirty, or as directed by your health care provider.   °· Wash the wound with soap and water 2 times a day. Rinse the wound off with water to remove all soap. Pat the wound dry with a clean towel.   °· After cleaning, apply a thin layer of the antibiotic ointment recommended by your health care provider. This will help prevent infection and keep the dressing from sticking.   °· You may shower as usual after the first 24 hours. Do not soak the  wound in water until the sutures are removed.   °· Get your sutures removed as directed by your health care provider. With facial lacerations, sutures should usually be taken out after 4-5 days to avoid stitch marks.   °· Wait a few days after your sutures are removed before applying any makeup. °For Skin Adhesive Strips: °· Keep the wound clean and dry.   °· Do not get the skin adhesive strips wet. You may bathe carefully, using caution to keep the wound dry.   °· If the wound gets wet, pat it dry with a clean towel.   °· Skin adhesive strips will fall off on their own. You may trim the strips as the wound heals. Do not remove skin adhesive strips that are still stuck to the wound. They will fall off in time.   °For Wound Adhesive: °· You may briefly wet your wound in the shower or bath. Do not soak or scrub the wound. Do not swim. Avoid periods of heavy sweating until the skin adhesive has fallen off on its own. After showering or bathing, gently pat the wound dry with a clean towel.   °· Do not apply liquid medicine, cream medicine, ointment medicine, or makeup to your wound while the skin adhesive is in place. This may loosen the film before your wound is healed.   °· If a dressing is placed over the wound, be careful not to apply tape directly over the skin adhesive. This may cause the adhesive to be pulled off before the wound is healed.   °· Avoid   prolonged exposure to sunlight or tanning lamps while the skin adhesive is in place.  The skin adhesive will usually remain in place for 5-10 days, then naturally fall off the skin. Do not pick at the adhesive film.  After Healing: Once the wound has healed, cover the wound with sunscreen during the day for 1 full year. This can help minimize scarring. Exposure to ultraviolet light in the first year will darken the scar. It can take 1-2 years for the scar to lose its redness and to heal completely.  SEEK IMMEDIATE MEDICAL CARE IF:  You have redness, pain, or  swelling around the wound.   You see ayellowish-white fluid (pus) coming from the wound.   You have chills or a fever.  MAKE SURE YOU:  Understand these instructions.  Will watch your condition.  Will get help right away if you are not doing well or get worse. Document Released: 06/22/2004 Document Revised: 03/05/2013 Document Reviewed: 12/26/2012 [facility] Patient Information 2015 [place], [place]. This information is not intended to replace advice given to you by your health care provider. Make sure you discuss any questions you have with your health care provider.  Toe Fracture Your caregiver has diagnosed you as having a fractured toe. A toe fracture is a break in the bone of a toe. "Buddy taping" is a way of splinting your broken toe, by taping the broken toe to the toe next to it. This "buddy taping" will keep the injured toe from moving beyond normal range of motion. Buddy taping also helps the toe heal in a more normal alignment. It may take 6 to 8 weeks for the toe injury to heal. [place] your toes taped together for as long as directed by your caregiver or until you see a doctor for a follow-up examination. You can change the tape after bathing. Always use a small piece of gauze or cotton between the toes when taping them together. This will help the skin stay dry and prevent infection.  Apply ice to the injury for 15-20 minutes each hour while awake for the first 2 days. Put the ice in a plastic bag and place a towel between the bag of ice and your skin.  After the first 2 days, apply heat to the injured area. Use heat for the next 2 to 3 days. Place a heating pad on the foot or soak the foot in warm water as directed by your caregiver.  Keep your foot elevated as much as possible to lessen swelling.  Wear sturdy, supportive shoes. The shoes should not pinch the toes or fit tightly against the toes.  Your caregiver may prescribe a rigid shoe if your  foot is very swollen.  Your may be given crutches if the pain is too great and it hurts too much to walk.  Only take over-the-counter or prescription medicines for pain, discomfort, or fever as directed by your caregiver.  If your caregiver has given you a follow-up appointment, it is very important to keep that appointment. Not keeping the appointment could result in a chronic or permanent injury, pain, and disability. If there is any problem keeping the appointment, you must call back to this facility for assistance. SEEK MEDICAL CARE IF:   You have increased pain or swelling, not relieved with medications.  The pain does not get better after 1 week.  Your injured toe is cold when the others are warm. SEEK IMMEDIATE MEDICAL CARE IF:   The  toe becomes cold, numb, or white.  The toe becomes hot (inflamed) and red. Document Released: 05/12/2000 Document Revised: 08/07/2011 Document Reviewed: 12/30/2007 [facility] Patient Information 2015 [place], [place]. This information is not intended to replace advice given to you by your health care provider. Make sure you discuss any questions you have with your health care provider.

## 2015-02-16 NOTE — ED Notes (Signed)
Gave patient an coke

## 2015-02-16 NOTE — ED Notes (Signed)
Ortho coming to place short leg splint

## 2015-02-16 NOTE — ED Notes (Signed)
Staffing called for a sitter. Belongings placed in a bag. Security called to wand pt.

## 2015-02-16 NOTE — ED Notes (Signed)
Snacks given\; pt pleasant and watching tv with sitter at bedside

## 2015-02-16 NOTE — ED Notes (Signed)
Psych talking with patient at this time
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
## 2015-02-16 NOTE — ED Notes (Signed)
Bacitracin applied to L knee, R albow and bilateral hands; areas wrapped with kerlex. New warm blankets given. Pt elevating L foot on pillows.

## 2015-02-17 NOTE — Progress Notes (Addendum)
12:57pm update: Left additional voicemail with [facility] requesting IQ and guardianship paperwork. Spoke with access clinician at [facility] who states pt's MCD is managed through [place]. Left voicemail for T. McKenzie at [facility] who may have been pt's care coordinator in the past per [facility] customer services. Will make care coordination referral and attempt gathering documentation.   Sharren Bridge, MSW, LCSW Clinical Social Work, Disposition  02/17/2015 [phone]   Judieth Keens with [facility], whom CSW had spoken with yesterday requesting IQ and legal guardianship paperwork be faxed. Left voicemail again requesting paperwork be faxed- will pursue appropriate placement when paperwork is received.   Sharren Bridge, MSW, LCSW Clinical Social Work, Disposition  02/17/2015 [phone]

## 2015-02-17 NOTE — ED Notes (Addendum)
error 

## 2015-02-17 NOTE — ED Notes (Signed)
Snack and drink given to patient. 

## 2015-02-17 NOTE — ED Notes (Signed)
Pt given 2100 snack.  2 jellos and a coke to drink. Sitter at bedside; lights down; TV on.

## 2015-02-17 NOTE — ED Notes (Signed)
Patient given a cup of ice water to drink with his lunch tray; sitter at bedside

## 2015-02-17 NOTE — ED Notes (Signed)
Message sent to pharmacy about synthroid.

## 2015-02-18 MED ORDER — ALUM & MAG HYDROXIDE-SIMETH 200-200-20 MG/5ML PO SUSP
15.0000 mL | Freq: Four times a day (QID) | ORAL | Status: DC | PRN
  Administered 2015-02-18 – 2015-02-22 (×2): 15 mL via ORAL
  Filled 2015-02-18 (×2): qty 30

## 2015-02-18 MED ORDER — IBUPROFEN 400 MG PO TABS
600.0000 mg | ORAL_TABLET | Freq: Once | ORAL | Status: AC
  Administered 2015-02-18: 600 mg via ORAL
  Filled 2015-02-18 (×2): qty 1

## 2015-02-18 MED ORDER — ACETAMINOPHEN 500 MG PO TABS
1000.0000 mg | ORAL_TABLET | Freq: Three times a day (TID) | ORAL | Status: DC | PRN
  Administered 2015-02-19 – 2015-03-01 (×9): 1000 mg via ORAL
  Filled 2015-02-18 (×9): qty 2
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
## 2015-02-18 NOTE — ED Notes (Signed)
Pt w/cast to left lower leg - unable to use crutches. Camilla, CM, aware and is ordering pt a rolling walker.
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
## 2015-02-18 NOTE — ED Notes (Addendum)
Patient unable to use crutches, was having trouble standing with the crutches, and wasn't able to take steps or keep them steady. Nurse was informed.

## 2015-02-18 NOTE — Progress Notes (Addendum)
Writer spoke with Terrence Dupont from [place] and informed her that patient is ready for d/c and that pt. needs to be picked up from the ED. Terrence Dupont stated that she will call back Probation officer as soon as she speaks with her Mudlogger. This Probation officer called again and left voicemail inquiring when can the group home pick up pt.  Verlon Setting, [place] Disposition staff 02/18/2015 3:52 PM

## 2015-02-19 NOTE — Progress Notes (Addendum)
12:00pm update: Officer with [place] [employer] called to state that they visited [facility] at 11a.m. today and no one was available to answer door. States they were able to speak with "Toney Rakes" via phone, a staff member, who stated that he was going to have his supervisor call [facility]. CSW has not received call as of yet and left additional voicemails for group home requesting call to writer or [place], MSW, LCSW Clinical Social Work, Disposition  02/19/2015 [phone]  Was advised per psychiatry pt is not in need of inpatient psychiatric treatment at this time, denies SI/HI/psychosis relevant risk factors.  Have been unable to reach representative's from patient's group home [facility]) after numerous attempts. Placed call to [employer] and was informed by Danae Chen that officer will be dispatched to the group home to attempt having staff call [facility] or this writer with time that pt can be transported back home.   Sharren Bridge, MSW, LCSW Clinical Social Work, Disposition  02/19/2015 [phone]

## 2015-02-19 NOTE — Progress Notes (Signed)
CSW received call from [place], Emeline Darling [phone] or [phone]). Per Ms. [name], she has spoken with group home staff today and they are aware that patient ready for discharge.  Ms. [name] has also been in contact with patient's father and step-mother and father leaving work to come home.  Ms. [name] to speak with father and group home to formulate plan for discharge. CSW will follow up.  Madelaine Bhat, [place]

## 2015-02-19 NOTE — Progress Notes (Signed)
CSW Surveyor, quantity spoke with Ms. [facility] Designer, multimedia) regarding Mr. Umbarger's status with the group home. Per Ms. [facility], after repeated attempts to utilize their resources to stabilize this gentleman in the group home, she feels as though they can no longer manage his needs. A subsequent call was made to [facility]. They are attempting to find a higher level group home and will provide funding to make this happen. No timetable was given for when they may have another option for the patient.   Elana Alm, LCSW Assistant Director Clinical Social Work Department [employer]

## 2015-02-19 NOTE — ED Notes (Signed)
Dinner tray to bedside

## 2015-02-19 NOTE — ED Notes (Signed)
Patient escorted to restroom by Air cabin crew.
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
## 2015-02-19 NOTE — ED Notes (Signed)
Patient calm, cooperative, smiling and pleasant when speaking to this RN.

## 2015-02-19 NOTE — ED Notes (Signed)
Patient ambulated to restroom with sitter escorting him. Pt states his foot is feeling much better. Pt able to ambulate without assistive device at this time, appears steady on his feet.

## 2015-02-19 NOTE — ED Notes (Signed)
Megan From [facility] advised she is still waiting to hear back from [facility] about when patient can return. Advised she will send for a wellness check at the group home since there has been no communication from them since yesterday afternoon.
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
## 2015-02-19 NOTE — Progress Notes (Signed)
CSW called to father, Tyrell Seifer [phone]), at number provided by [facility].  There was no answer. CSW received call from [facility], Danne Baxter [phone]). Per Ms. Smith, group home refusing to accept patient back as they "cannot provide care." Called back to [employer] at [place] to inform. Ms. [name] continuing to look for placement.  Madelaine Bhat, [place]

## 2015-02-19 NOTE — ED Notes (Signed)
Patient stating he would like to wash up in the restroom. Pt given wash rag and clean paper scrubs and socks.

## 2015-02-20 NOTE — ED Notes (Signed)
Pt's father had requested for RN to call group home and ask them to bring pt's glasses, teeth and Bible. RN advised pt group home may not be able to send someone today d/t week-end. Voiced understanding - then stated for RN not to call them - that he will get all of his belongings when he is d/c'd to home.

## 2015-02-20 NOTE — ED Notes (Signed)
Pt on phone at nurses' desk talking w/his father.

## 2015-02-20 NOTE — ED Provider Notes (Signed)
Patient requesting suture removal. Sutures only In place for 4 days. Advised we'll need to wait longer. Wounds will be cleaned of scabbed areas. Plan suture removal on September 26.  Ezequiel Essex, MD 02/20/15 [phone]

## 2015-02-20 NOTE — Progress Notes (Signed)
Orthopedic Tech Progress Note Patient Details:  Daniel Holmes 30-Oct-1970 [id number]  Ortho Devices Type of Ortho Device: Postop shoe/boot Ortho Device/Splint Interventions: Application   [name] 02/20/2015, 3:29 PM

## 2015-02-21 NOTE — ED Notes (Signed)
Talked w/his father on phone - [phone].

## 2015-02-21 NOTE — ED Notes (Signed)
Sanda Linger Tech, aware of order to remove cast.

## 2015-02-21 NOTE — Progress Notes (Signed)
Orthopedic Tech Progress Note Patient Details:  Daniel Holmes 22-May-1971 [id number]  Ortho Devices Type of Ortho Device: Buddy tape Ortho Device/Splint Interventions: Application   [name] 02/21/2015, 10:43 AM

## 2015-02-21 NOTE — ED Notes (Addendum)
Scab noted below right eye - cleansed area in attempt to assess sutures - unable to remove all scabs and unable to assess sutures at this time. Staples remain intact to head. Pt tolerated procedure well.

## 2015-02-21 NOTE — ED Notes (Signed)
Dr Schlossman in w/pt. 
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
## 2015-02-21 NOTE — ED Notes (Signed)
Dr Billy Fischer in w/pt removing sutures beneath right lower eye and 1 from bridge of nose. Pt tolerated well.

## 2015-02-21 NOTE — ED Notes (Addendum)
Pt at nurses' desk waiting on his father to call back. Pt given Coke as requested for snack.

## 2015-02-22 NOTE — Progress Notes (Addendum)
LCSW is actively working on securing a new placement for patient as current group home is refusing to accept patient back.  Patient father is involved and is his legal guardian. Surveyor, quantity of SW has also been involved with case anda ware of barriers of patient being DC from ED, and needing higher level of care.  [facility] Coordinator is also involved:  Caryl Pina who has been called today to coordinate care and assist with placement. Message left at 9:30am.  Call returned from [facility] regarding placement.   Spoke with Caryl Pina, whom informed LCSW of patient's Care Coordinator (IDD  Joycelyn Das) [phone] Representative from [facility] [address]) will be by today to see patient in ED and complete assessment.  Derrick arranged to see patient today, completed assessment around 1:00pm and feels patient is appropriate for placement in [place].  Awaiting review from [facility] to approve new placement home. Derrick/Ashley to follow up with ED SW.    Plan:  Seek new placement and work with [facility] in effort to securing group home for placement. Father is aware and up-to-date with plan.  He works during the day and is very difficult to reach, unless after 6pm.  Lane Hacker, MSW Clinical Social Work: Emergency Room [phone]

## 2015-02-22 NOTE — ED Notes (Signed)
Pt in hall watching RN and interacting with all people walking by.  This RN explained to pt that we are waiting on one more medication.  Pt continues to stand in hall.

## 2015-02-22 NOTE — ED Notes (Signed)
Pt provided with snack

## 2015-02-22 NOTE — ED Notes (Signed)
Pt requesting to walk in halls after seeing other patient walking.  Pt allowed to walk around nurse's station with sitter.  Pt began to stop and talk to other patients in their room.  Instructed not to do so by staff.  Pt took one more lap and returned to room.  Pt pleasant, cooperative.

## 2015-02-22 NOTE — ED Notes (Signed)
Pt given bacitracin to apply to abrasions.

## 2015-02-22 NOTE — ED Notes (Signed)
Snack and drink given to patient at snack time.

## 2015-02-22 NOTE — ED Notes (Signed)
Pt instructed to lay back in bed now that we have completed all of his medications.  Pt's room organized and pt provided with a new warm blanket.  Pt reclining in bed attempting to rest at this time.

## 2015-02-22 NOTE — ED Notes (Signed)
Pt requested that this RN come in and speak with him.  This RN went in and pt continues to ask this RN to call his old group home and tell them to pack up his things and bring "a nice shirt, nice pants, and my razor" to the ED for pt to be able to "get ready for my big day tomorrow".  Pt is under the impression that he is getting his own apartment and will be dc'd tomorrow.  This RN read through patient's chart and notes/updates and did not see anything stating the same, so she informed patient she had not been informed of this plan yet, and would be happy to call once "everything is official".  Pt verbalized understanding and states he would like to have his meds promptly at 10pm so he can get sleep for his "big day tomorrow".

## 2015-02-23 NOTE — ED Notes (Signed)
Patient was given 2 cups of water and chocolate milk at snack time.

## 2015-02-23 NOTE — ED Notes (Signed)
3pm snack given.

## 2015-02-23 NOTE — Progress Notes (Signed)
Spoke with pt's [place] [phone]), who states she and supervisor Caryl Pina are working to obtain [place] authorization for pt to move into new group home (Derrick with [facility] assessed pt yesterday). States they will keep CSW up to date with progress.  Sharren Bridge, MSW, LCSW Clinical Social Work, Disposition  02/23/2015 [phone]

## 2015-02-24 NOTE — ED Notes (Signed)
Pt ambulating with sitter in hallway. Pt calm and cooperative.

## 2015-02-24 NOTE — ED Notes (Addendum)
Pt called out asking for an ice pack, stating that his big toe on his left foot is swollen. Pt states that he broke it. Pt's toe is swollen and red. Pt advised to sit in recliner with his feet up and ice his toe to reduce swelling. Pt is agreeable and is currently sitting in a recliner with ice on his foot.   Pt is eager for discharge home. This RN told the pt that she was waiting to make sure that he had a place to go to before he was discharged. Pt stated that he had an apartment that he could go to.

## 2015-02-24 NOTE — ED Notes (Signed)
Pt eating dinner tray °

## 2015-02-24 NOTE — ED Notes (Signed)
Pt ambulatory w/ steady gait to restroom. 

## 2015-02-24 NOTE — ED Notes (Signed)
Pt awake in chair in room, requesting breakfast tray and coke. Explained meal tray would be ordered and arrive around 0700 & pt may have water in between meals. Pt verbalized understanding, requested cup of water and brought pt water. No other concerns at this time.

## 2015-02-24 NOTE — Progress Notes (Signed)
LCSW is following for disposition. Patient has been accepted in [facility] level [facility] group home and only barrier is payor source and paperwork to be completed by [facility] and new group home.  Spoke with Caryl Pina who reports things should be staffed with Clinical Director and finalized with an outcome today. LCSW awaiting call from [facility] and will facilitate DC once clearance is given.  Patient very calm and amendable to new group home. Expresses his excitement of having a new place with people that like him. Patient shows a bright affect, laughing and engaging with staff within the hallways and his sitters. He is wanting new clothing as his clothes have a lot of blood on them and LCSW will arrange for clothing. Patient has kept in contact with his father and father will be contacted once more is know about his plans.  LCSW will continue to follow and assist with disposition.  Lane Hacker, MSW Clinical Social Work: Emergency Room [phone]

## 2015-02-24 NOTE — Progress Notes (Signed)
CSW spoke with Caryl Pina of [facility] who states that they are waiting to hear from Linette Gunderson whether/not they can accept pt.  Caryl Pina is fairly positive that they will accept pt.  CSW will continue to follow for disposition.

## 2015-02-24 NOTE — ED Notes (Signed)
Awaiting medication delivery from pharmacy.

## 2015-02-24 NOTE — ED Notes (Signed)
Per Claiborne Billings at [facility], pt is a social work case waiting for group home placement.

## 2015-02-24 NOTE — ED Notes (Signed)
Pt asleep in chair in room.

## 2015-02-24 NOTE — ED Notes (Signed)
Pt up and ambulating without assistance. Sitter at pt side.

## 2015-02-25 NOTE — Progress Notes (Signed)
CSW spoke with [place] from [facility] [place].  Pt is pending authorization to d/c to [facility], hopefully on 02/26/2015. Pt has been informed of, and understands plan.  Caryl Pina will inform pt's father of plan this pm. CSW will continue to follow.

## 2015-02-25 NOTE — ED Notes (Addendum)
Pt up in chair eating breakfast; no signs of distress; reports he is ready to get out of here; does not want anything to help him rest; sitter at bedside.

## 2015-02-25 NOTE — ED Notes (Addendum)
Patient given a snack and coke., Diet order for dinner has been faxed.

## 2015-02-25 NOTE — ED Notes (Addendum)
New scrubs given; pt took shower. Sitter at bedside. Pharmacy called for synthroid.

## 2015-02-25 NOTE — ED Notes (Signed)
Patient was given a snack and drink. 

## 2015-02-25 NOTE — ED Notes (Signed)
Pt doing laps with sitter;

## 2015-02-25 NOTE — Progress Notes (Addendum)
Placed calls to pt's care coordinator  Kermit Balo and supervisor Emeline Darling requesting update re: plan for pt. Left voicemails.   Spoke with Montine Circle from [facility] [phone]). Derrick states after speaking with Venetia Constable today, they expect pt to be able to move to group home by tomorrow 9/30, "Saturday 10/1 at the latest." States Sharon requested additional paperwork in order to finalize authorization, which Montine Circle provided this afternoon. States he will call [facility] SW with updates "in case it happens sooner than expected and he can come tonight." States he would let SW know if further assistance is needed.  Sharren Bridge, MSW, LCSW Clinical Social Work, Disposition  02/25/2015 [phone]

## 2015-02-25 NOTE — ED Notes (Signed)
Sitter at bedside; pt still awake and talking in rapid speech; no signs of distress.

## 2015-02-25 NOTE — Progress Notes (Signed)
CSW left voice message for [facility] supervisor, Emeline Darling [phone]). Will follow up regarding placement.  Madelaine Bhat, [place]

## 2015-02-26 NOTE — Progress Notes (Signed)
CSW has left voice messages for [facility] IDD care coordinator, Kermit Balo  [phone]) as well as Harlow Asa, Emeline Darling [phone]) requesting update. CSW spoke with Montine Circle at [facility] [phone]) who confirms that his facility has accepted patient but still waiting on final approval from [place].  Madelaine Bhat, [place]

## 2015-02-26 NOTE — Progress Notes (Signed)
CSW received message from Emeline Darling, [place], that authorization to new group home still pending.  Madelaine Bhat, [place]

## 2015-02-26 NOTE — ED Notes (Signed)
Patient taking shower. Sitter at the patient's side.

## 2015-02-26 NOTE — ED Notes (Signed)
Spoke with [place], SW. Trying to get in touch with [facility] to verify patient placement. Has not been able to get in touch with anyone.

## 2015-02-26 NOTE — ED Notes (Signed)
Snack provided to patient. Orange jello and caffeine free coke requested. Patient pleasant, smiling.

## 2015-02-26 NOTE — ED Notes (Signed)
Spoke with SW about patient's plan of care.

## 2015-02-26 NOTE — ED Notes (Signed)
Patient given snack.  

## 2015-02-27 NOTE — Progress Notes (Signed)
CSW attempted to contact Daniel Holmes [phone]) regarding [place] but received no answer. CSW left voice message at 10:02AM. CSW will continue to follow up.   Lucius Conn, [place] Emergency Department Ph: [phone]

## 2015-02-27 NOTE — ED Provider Notes (Addendum)
Assumed care at 15 AM. Briefly 44 year old male with a chief complaint of suicidal ideation. Lab work has been performed and unremarkable. Patient is awaiting transport to centralized psychiatric location. This will likely occur in 3 days time. Patient is well-appearing nontoxic. Wounds are all healed well. No noted erythema or drainage.   SUTURE REMOVAL Date/Time: 02/27/2015 9:48 AM Performed by: Tyrone Nine Shaan Rhoads Authorized by: Deno Etienne Consent: Verbal consent obtained. Risks and benefits: risks, benefits and alternatives were discussed Consent given by: patient Required items: required blood products, implants, devices, and special equipment available Patient identity confirmed: verbally with patient Time out: Immediately prior to procedure a "time out" was called to verify the correct patient, procedure, equipment, support staff and site/side marked as required. Body area: head/neck Location details: scalp Wound Appearance: clean, granulation tissue in place Staples Removed: 6 Facility: sutures placed in this facility Patient tolerance: Patient tolerated the procedure well with no immediate complications  Deno Etienne, DO 02/27/15 [place], DO 02/27/15 [phone]

## 2015-02-27 NOTE — ED Notes (Signed)
Patient was given a snack and drink. 

## 2015-02-27 NOTE — ED Notes (Addendum)
Diet order for lunch faxed at 11am.

## 2015-02-28 NOTE — ED Notes (Signed)
Coke and rice crispy treat provided at [employer]

## 2015-02-28 NOTE — Progress Notes (Signed)
CSW attempted to contact Daniel Holmes [phone]) regarding Coke but received no answer. CSW left voice message at 11:00AM. CSW will continue to follow up.   Lucius Conn, [place] Emergency Department Ph: [phone]

## 2015-02-28 NOTE — ED Notes (Signed)
Patient was given a snack, coke and a cup of water.

## 2015-02-28 NOTE — ED Notes (Signed)
Standing in hallway waiting for shower supplies.

## 2015-03-01 NOTE — ED Notes (Signed)
Return to room

## 2015-03-01 NOTE — Progress Notes (Addendum)
LCSW continues to assist with disposition of patient in new group home  There is still a pending status regarding patient's placement with him being accepted to [facility] per Montine Circle, but awaiting [place] for authorization as this is an IDD placement.  Calls placed to [facility]), Caryl Pina [phone]) with no response and again messages left.  LCSW has been given Ashley's supervisor: Jackqulyn Livings [phone]  If no response by current providers, call will be placed to Al by 11am today.   Medical Director has also be contacted for additional support: Burnard Bunting.  Patient is hopeful for his "big move" and has been calm and appropriate while awaiting placement.  No behaviors as patient has been taking his medication and interacting with staff to communicate his needs.    LCSW continues to follow.  At 11:30am: LCSW received call from [facility]) who reports all the above and that approval for payment should be finalized this week, however it is unknown when and what day.  LCSW awaiting call today from [facility] after case is staffed with [facility] with tentative plan for DC.  Lane Hacker, MSW Clinical Social Work: Emergency Room [phone]

## 2015-03-01 NOTE — Progress Notes (Signed)
Received phone call from [place] this pm re: pt's disposition. Pt will be going to [facility] [place] in the morning with Heide Scales, [facility].  CSW called and spoke with pt's father this pm who is agreeable to the d/c plan and transportation arrangements.  [facility] CSW to follow up in am.

## 2015-03-01 NOTE — ED Notes (Signed)
Pt amb to the bathroom with sitter.

## 2015-03-02 NOTE — Progress Notes (Addendum)
[facility] ED CM received call from [facility] ED unit secretary when Mr Ruthann Cancer from [facility] called to request Prescriptions for this pt d/c from [facility] 03/02/15 States no prescriptions.  CM assess Mr Ruthann Cancer to get clarity on the concern.  CM reviewed EPIC notes, MAR and AVS.  CM obtained a return contact number as the office number [phone] option #2 to reach Ms Saralyn Pilar the executive director. Encouraged Mr Ruthann Cancer to also contact the last group home to find out the name of pt's pcp so that future Rx(s) can be obtained and pt can get f/u medical care  Cm spoke with Jinny Blossom and Jarrett Soho, SW at [facility] to discuss this.  Provided contact number to return call

## 2015-03-02 NOTE — ED Notes (Signed)
Patient was given a snack, coke and water.

## 2015-03-02 NOTE — ED Notes (Signed)
Patient was given a cup of water. 

## 2015-03-02 NOTE — Progress Notes (Addendum)
Per Caryl Pina at [place], pt to move into [facility] this morning. Group Home staff transporting pt.   Patient is being picked up currently 10:30am.  Spoke with Caryl Pina per her call to this Probation officer and authorization is complete. Patient voices excitement of new group home and all paperwork is being sent with patient.  Patient's father was contact on 10/3 even after he got off of work and notified of transfer for today and he is in agreement.  Patient to DC to new group home. Updated Care plan in chart, FYI information for patient.     Sharren Bridge, MSW, LCSW Clinical Social Work, Disposition  03/02/2015 [phone]

## 2015-03-02 NOTE — Progress Notes (Signed)
LCSW completed FL2 and faxed to [facility] per request.  Copies placed in patient's medical file and group home will pick up patient's belongings and medications from old group home this evening. No other needs per group home.  Lane Hacker, MSW Clinical Social Work: Emergency Room [phone]

## 2015-03-02 NOTE — ED Provider Notes (Signed)
Assumed care of pt who is now stable for dc to group home.  DC home in stable condition.  1. Suicide attempt   2. Facial laceration, initial encounter   3. Toe fracture, left, closed, initial encounter      Debby Freiberg, MD 03/02/15 1034

## 2015-03-09 ENCOUNTER — Other Ambulatory Visit: Payer: Self-pay | Admitting: Family Medicine

## 2015-03-09 ENCOUNTER — Other Ambulatory Visit: Payer: Self-pay | Admitting: *Deleted

## 2015-03-09 MED ORDER — ACETAMINOPHEN 500 MG PO TABS: 1000.0000 mg | ORAL_TABLET | Freq: Two times a day (BID) | ORAL | Status: DC | PRN

## 2015-03-09 MED ORDER — LEVOTHYROXINE SODIUM 75 MCG PO TABS: ORAL_TABLET | ORAL | Status: DC

## 2015-03-09 NOTE — Telephone Encounter (Signed)
Patient in nurse clinic along with his new caregiver Mr. Ruthann Cancer.  Patient is running low on medications.  Patient has an upcoming appointment with PCP on 03/24/15.  Patient was hospitalized and when discharged he was discharged to a new facility with Mr. Ruthann Cancer.  The old facility had at least one refill on patient's medications.  Please advise. Please call with questions [phone].  Derl Barrow, RN

## 2015-03-09 NOTE — Telephone Encounter (Signed)
Patient's caregiver Mr. Ruthann Cancer informed that Dr. Erin Hearing only refilled the thyroid medication and the tylenol.  Patient most speak with his psychiatrist for the other medication refills.  Derl Barrow, RN

## 2015-03-09 NOTE — Telephone Encounter (Signed)
I can prescribe his thyroid medication and tylenol.  They will need to contact his psychiatrist about his other psychiatric medications

## 2015-03-24 ENCOUNTER — Encounter: Payer: Self-pay | Admitting: Family Medicine

## 2015-03-24 ENCOUNTER — Ambulatory Visit (INDEPENDENT_AMBULATORY_CARE_PROVIDER_SITE_OTHER): Payer: Medicaid Other | Admitting: Family Medicine

## 2015-03-24 VITALS — BP 120/78 | HR 75 | Temp 97.7°F | Ht 66.0 in | Wt 172.0 lb

## 2015-03-24 DIAGNOSIS — M1990 Unspecified osteoarthritis, unspecified site: Secondary | ICD-10-CM

## 2015-03-24 DIAGNOSIS — E038 Other specified hypothyroidism: Secondary | ICD-10-CM | POA: Diagnosis not present

## 2015-03-24 DIAGNOSIS — Z23 Encounter for immunization: Secondary | ICD-10-CM | POA: Diagnosis present

## 2015-03-24 NOTE — Progress Notes (Signed)
   Subjective:    Patient ID: Daniel Holmes, male    DOB: 15-Oct-1970, 44 y.o.   MRN: [id number]  HPI  Left Knee Arthritis Worsened since was in accident a few months ago.  Hurts him intermittently.  No soft tissue swelling or redness or fever. Has a sleeve but is too large.  Takes tylenol as needed  HYPOTHYROIDISM Duration - years.  No change in course.  No known family history  Disease Monitoring Weight changes: none  Skin Changes: no Palpitations: none Heat/Cold intolerance: no  Medication Monitoring Compliance:  Daily    Last TSH:   Lab Results  Component Value Date   TSH 3.642 11/15/2014    Chief Complaint noted Review of Symptoms - see HPI PMH - Smoking status noted.   Vital Signs reviewed   Review of Systems     Objective:   Physical Exam  Left Knee - FROM no effusion.  Mildly tender.  Notable vastus medialis atropny compared to R.  Neck:  No deformities, thyromegaly, masses, or tenderness noted.   Supple with full range of motion without pain.        Assessment & Plan:

## 2015-03-24 NOTE — Assessment & Plan Note (Signed)
Stable -  Lab Results  Component Value Date   TSH 3.642 11/15/2014   Will need to repeat TSH in June

## 2015-03-24 NOTE — Patient Instructions (Addendum)
Good to see you today!  Thanks for coming in.  Left Knee Do straight leg heel push downs to count of 10 at least 3 times a day.  Go up every week Take tylenol as needed for pain Need a small knee sleeve size - small or medium with a knee cap hole - Gave Rx  Cough No signs of pneumonia or infection Should improve over the next 4 weeks  Return in one month with medication list

## 2015-03-24 NOTE — Assessment & Plan Note (Signed)
Left knee worsened.  Discussed analgesics Rx for aspercreme, sleeve and strengthening exercise

## 2015-05-02 ENCOUNTER — Encounter (HOSPITAL_COMMUNITY): Payer: Self-pay | Admitting: Emergency Medicine

## 2015-05-02 ENCOUNTER — Emergency Department (HOSPITAL_COMMUNITY)
Admission: EM | Admit: 2015-05-02 | Discharge: 2015-05-02 | Disposition: A | Discharge status: Home / Self Care | Payer: Medicaid Other | Attending: Emergency Medicine | Admitting: Emergency Medicine

## 2015-05-02 DIAGNOSIS — R630 Anorexia: Secondary | ICD-10-CM | POA: Diagnosis not present

## 2015-05-02 DIAGNOSIS — Z880 Allergy status to penicillin: Secondary | ICD-10-CM | POA: Diagnosis not present

## 2015-05-02 DIAGNOSIS — J45909 Unspecified asthma, uncomplicated: Secondary | ICD-10-CM | POA: Diagnosis not present

## 2015-05-02 DIAGNOSIS — F29 Unspecified psychosis not due to a substance or known physiological condition: Secondary | ICD-10-CM | POA: Diagnosis not present

## 2015-05-02 DIAGNOSIS — R1084 Generalized abdominal pain: Secondary | ICD-10-CM | POA: Diagnosis present

## 2015-05-02 DIAGNOSIS — Z79899 Other long term (current) drug therapy: Secondary | ICD-10-CM | POA: Insufficient documentation

## 2015-05-02 DIAGNOSIS — M1990 Unspecified osteoarthritis, unspecified site: Secondary | ICD-10-CM

## 2015-05-02 DIAGNOSIS — F70 Mild intellectual disabilities: Secondary | ICD-10-CM | POA: Insufficient documentation

## 2015-05-02 DIAGNOSIS — Z87891 Personal history of nicotine dependence: Secondary | ICD-10-CM | POA: Insufficient documentation

## 2015-05-02 DIAGNOSIS — R109 Unspecified abdominal pain: Secondary | ICD-10-CM

## 2015-05-02 LAB — URINE MICROSCOPIC-ADD ON: BACTERIA UA: NONE SEEN

## 2015-05-02 LAB — CBC WITH DIFFERENTIAL/PLATELET
Basophils Absolute: 0 10*3/uL (ref 0.0–0.1)
Basophils Relative: 0 %
EOS ABS: 0 10*3/uL (ref 0.0–0.7)
EOS PCT: 1 %
HCT: 38.4 % — ABNORMAL LOW (ref 39.0–52.0)
Hemoglobin: 12.8 g/dL — ABNORMAL LOW (ref 13.0–17.0)
LYMPHS ABS: 2.6 10*3/uL (ref 0.7–4.0)
Lymphocytes Relative: 41 %
MCH: 31 pg (ref 26.0–34.0)
MCHC: 33.3 g/dL (ref 30.0–36.0)
MCV: 93 fL (ref 78.0–100.0)
Monocytes Absolute: 0.3 10*3/uL (ref 0.1–1.0)
Monocytes Relative: 6 %
Neutro Abs: 3.2 10*3/uL (ref 1.7–7.7)
Neutrophils Relative %: 52 %
Platelets: 170 10*3/uL (ref 150–400)
RBC: 4.13 MIL/uL — AB (ref 4.22–5.81)
RDW: 12.4 % (ref 11.5–15.5)
WBC: 6.2 10*3/uL (ref 4.0–10.5)

## 2015-05-02 LAB — URINALYSIS, ROUTINE W REFLEX MICROSCOPIC
Bilirubin Urine: NEGATIVE
Glucose, UA: NEGATIVE mg/dL
Hgb urine dipstick: NEGATIVE
Ketones, ur: NEGATIVE mg/dL
LEUKOCYTES UA: NEGATIVE
NITRITE: NEGATIVE
PROTEIN: 30 mg/dL — AB
SPECIFIC GRAVITY, URINE: 1.016 (ref 1.005–1.030)
pH: 7 (ref 5.0–8.0)

## 2015-05-02 LAB — COMPREHENSIVE METABOLIC PANEL
ALT: 24 U/L (ref 17–63)
ANION GAP: 8 (ref 5–15)
AST: 30 U/L (ref 15–41)
Albumin: 4.3 g/dL (ref 3.5–5.0)
Alkaline Phosphatase: 82 U/L (ref 38–126)
BUN: 6 mg/dL (ref 6–20)
CHLORIDE: 105 mmol/L (ref 101–111)
CO2: 25 mmol/L (ref 22–32)
Calcium: 9.6 mg/dL (ref 8.9–10.3)
Creatinine, Ser: 0.83 mg/dL (ref 0.61–1.24)
GFR calc non Af Amer: >60 mL/min (ref >60)
Glucose, Bld: 120 mg/dL — ABNORMAL HIGH (ref 65–99)
POTASSIUM: 3.8 mmol/L (ref 3.5–5.1)
SODIUM: 138 mmol/L (ref 135–145)
Total Bilirubin: 0.7 mg/dL (ref 0.3–1.2)
Total Protein: 6.7 g/dL (ref 6.5–8.1)

## 2015-05-02 LAB — LIPASE, BLOOD: Lipase: 24 U/L (ref 11–51)

## 2015-05-02 MED ORDER — ONDANSETRON 4 MG PO TBDP: 4.0000 mg | ORAL_TABLET | Freq: Once | ORAL | Status: DC

## 2015-05-02 MED ORDER — PANTOPRAZOLE SODIUM 40 MG IV SOLR: 40.0000 mg | Freq: Once | INTRAVENOUS | Status: DC

## 2015-05-02 MED ORDER — ONDANSETRON HCL 4 MG/2ML IJ SOLN: 4.0000 mg | Freq: Once | INTRAMUSCULAR | Status: DC

## 2015-05-02 NOTE — ED Notes (Addendum)
Provided patient with coke, patient tolerating well. Denies abdominal pain/nausea.

## 2015-05-02 NOTE — ED Notes (Signed)
Patient and caregiver verbalized understanding of discharge instructions and denies any further needs or questions at this time. VS stable, patient ambulatory with steady gait.

## 2015-05-02 NOTE — Discharge Instructions (Signed)
Drink plenty of fluids, rest Continue usual home medications Please follow up with your primary doctor in 3 days for discussion of your diagnoses and further evaluation after today's visit Please return to the ER for fever, worsening abdominal pain, any new or worsening symptoms, any additional concerns

## 2015-05-02 NOTE — ED Provider Notes (Signed)
CSN: [id number]     Arrival date & time 05/02/15  1942 History   First MD Initiated Contact with Patient 05/02/15 1951     Chief Complaint  Patient presents with  . Abdominal Pain  . Generalized Body Aches     (Consider location/radiation/quality/duration/timing/severity/associated sxs/prior Treatment) The history is provided by the patient, a caregiver and medical records. No language interpreter was used.   Daniel Holmes is a 44 y.o. male  with a PMH of developmental delay, asthma, arthritis who presents to the Emergency Department complaining of intermittent body aches, generalized abdominal pain and decreased appetite x 1 day. No medications tried for relief. No alleviating or aggravating factors noted. No sick contacts. Per caregiver, his favorite food is apple pie - he gave him a slice today and pt. Felt too sick to eat. Denies fever, vomiting and diarrhea.   Past Medical History  Diagnosis Date  . Psychosis     See Dr Laverta Baltimore [name]  . Asthma   . Mental developmental delay    Past Surgical History  Procedure Laterality Date  . Hernia repair     No family history on file. Social History  Substance Use Topics  . Smoking status: Former Smoker -- 0.00 packs/day    Types: Cigarettes  . Smokeless tobacco: None  . Alcohol Use: No    Review of Systems  Constitutional: Positive for appetite change. Negative for fever, chills, diaphoresis, activity change and fatigue.  HENT: Negative for congestion, rhinorrhea and sore throat.   Eyes: Negative for visual disturbance.  Respiratory: Negative for cough, shortness of breath and wheezing.   Cardiovascular: Negative.   Gastrointestinal: Positive for abdominal pain. Negative for nausea, vomiting and diarrhea.  Endocrine: Negative for polydipsia and polyuria.  Musculoskeletal: Positive for arthralgias. Negative for myalgias, back pain and neck pain.  Skin: Negative for rash.  Neurological: Negative for  dizziness, weakness and headaches.      Allergies  Penicillins  Home Medications   Prior to Admission medications   Medication Sig Start Date End Date Taking? Authorizing Provider  acetaminophen (TYLENOL) 500 MG tablet Take 2 tablets (1,000 mg total) by mouth 2 (two) times daily as needed (knee pain). 03/09/15   Lind Covert, MD  cloNIDine (CATAPRES) 0.1 MG tablet Take 1 tablet (0.1 mg total) by mouth 2 (two) times daily. 11/10/14   Clovis Fredrickson, MD  lamoTRIgine (LAMICTAL) 25 MG tablet Take 100 mg by mouth at bedtime.    Historical Provider, MD  levothyroxine (SYNTHROID, LEVOTHROID) 75 MCG tablet TAKE (1) TABLET BY MOUTH ONCE DAILY BEFORE BREAKFAST. 03/09/15   Lind Covert, MD  lithium carbonate (ESKALITH) 450 MG CR tablet Take 1 tablet (450 mg total) by mouth every 12 (twelve) hours. 11/10/14   Clovis Fredrickson, MD  Multiple Vitamins-Minerals (MULTIVITAMIN ADULTS 50+) TABS Take 1 tablet by mouth daily. 11/10/14   Clovis Fredrickson, MD  OLANZapine (ZYPREXA) 20 MG tablet Take 1.5 tablets (30 mg total) by mouth at bedtime. Take 1 and 1/2 tablets by mouth at bedtime. Patient taking differently: Take 30 mg by mouth daily. Take 1 and 1/2 tablets by mouth at bedtime. 11/10/14   Clovis Fredrickson, MD  traZODone (DESYREL) 150 MG tablet Take 1 tablet (150 mg total) by mouth at bedtime. Patient taking differently: Take 150 mg by mouth 2 (two) times daily.  11/10/14   Clovis Fredrickson, MD  trolamine salicylate (ASPERCREME/ALOE) 10 % cream Apply 1 application topically 4 (four)  times daily as needed for muscle pain. 12/02/14   Lind Covert, MD  Vitamin D, Ergocalciferol, (DRISDOL) 50000 UNITS CAPS capsule Take 1 capsule (50,000 Units total) by mouth every 14 (fourteen) days. 11/10/14   Jolanta B Pucilowska, MD   BP 111/63 mmHg  Pulse 89  Temp(Src) 97.6 F (36.4 C) (Oral)  Resp 16  SpO2 97% Physical Exam  Constitutional: He is oriented to person, place, and  time. He appears well-developed and well-nourished.  Alert and in no acute distress  HENT:  Head: Normocephalic and atraumatic.  Cardiovascular: Normal rate, regular rhythm, normal heart sounds and intact distal pulses.  Exam reveals no gallop and no friction rub.   No murmur heard. Pulmonary/Chest: Effort normal and breath sounds normal. No respiratory distress. He has no wheezes. He has no rales. He exhibits no tenderness.  Abdominal: He exhibits no mass. There is no rebound and no guarding.  Abdomen soft, non-distended Generalized abdominal tenderness.  Bowel sounds positive in all four quadrants  Musculoskeletal: He exhibits no edema.  Neurological: He is alert and oriented to person, place, and time.  Mental delay  Skin: Skin is warm and dry. No rash noted.  Psychiatric: He has a normal mood and affect. His behavior is normal. Judgment and thought content normal.  Nursing note and vitals reviewed.   ED Course  Procedures (including critical care time) Labs Review Labs Reviewed  CBC WITH DIFFERENTIAL/PLATELET - Abnormal; Notable for the following:    RBC 4.13 (*)    Hemoglobin 12.8 (*)    HCT 38.4 (*)    All other components within normal limits  COMPREHENSIVE METABOLIC PANEL - Abnormal; Notable for the following:    Glucose, Bld 120 (*)    All other components within normal limits  URINALYSIS, ROUTINE W REFLEX MICROSCOPIC (NOT AT [facility]) - Abnormal; Notable for the following:    APPearance CLOUDY (*)    Protein, ur 30 (*)    All other components within normal limits  URINE MICROSCOPIC-ADD ON - Abnormal; Notable for the following:    Squamous Epithelial / LPF 0-5 (*)    All other components within normal limits  LIPASE, BLOOD    Imaging Review No results found. I have personally reviewed and evaluated these images and lab results as part of my medical decision-making.   EKG Interpretation None      MDM   Final diagnoses:  Abdominal pain  Mild intellectual  disability  Arthritis  Daniel Holmes presents with generalized abdominal pain and body aches  8:27 PM - Patient re-evaluated and states that abdominal pain has resolved. Fluid challenge with coke, pt. Tolerated well.  Labs reviewed and reassuring. Patient states he feels improved and feels ready to go home. Discussed strict follow up and return precautions with caregiver and patient. Caregiver expresses understanding and agreement of plan.  Patient discussed with Dr. Eulis Foster who agrees with treatment plan.     [facility] Derika Eckles, PA-C 05/02/15 2137  Daleen Bo, MD 05/03/15 2033

## 2015-05-02 NOTE — ED Notes (Signed)
Pt. reports mid abdominal pain and generalized body aches onset this evening , denies fever or emesis , no diarrhea , last BM yesterday .

## 2015-05-30 ENCOUNTER — Emergency Department (HOSPITAL_COMMUNITY): Payer: Medicaid Other

## 2015-05-30 ENCOUNTER — Encounter (HOSPITAL_COMMUNITY): Payer: Self-pay | Admitting: Emergency Medicine

## 2015-05-30 ENCOUNTER — Emergency Department (HOSPITAL_COMMUNITY)
Admission: EM | Admit: 2015-05-30 | Discharge: 2015-05-31 | Disposition: A | Discharge status: Home / Self Care | Payer: Medicaid Other | Attending: Emergency Medicine | Admitting: Emergency Medicine

## 2015-05-30 DIAGNOSIS — J159 Unspecified bacterial pneumonia: Secondary | ICD-10-CM | POA: Diagnosis not present

## 2015-05-30 DIAGNOSIS — Z8659 Personal history of other mental and behavioral disorders: Secondary | ICD-10-CM | POA: Insufficient documentation

## 2015-05-30 DIAGNOSIS — J45909 Unspecified asthma, uncomplicated: Secondary | ICD-10-CM | POA: Insufficient documentation

## 2015-05-30 DIAGNOSIS — R059 Cough, unspecified: Secondary | ICD-10-CM

## 2015-05-30 DIAGNOSIS — M940 Chondrocostal junction syndrome [Tietze]: Secondary | ICD-10-CM | POA: Insufficient documentation

## 2015-05-30 DIAGNOSIS — Z79899 Other long term (current) drug therapy: Secondary | ICD-10-CM | POA: Diagnosis not present

## 2015-05-30 DIAGNOSIS — R05 Cough: Secondary | ICD-10-CM

## 2015-05-30 DIAGNOSIS — Z880 Allergy status to penicillin: Secondary | ICD-10-CM | POA: Diagnosis not present

## 2015-05-30 DIAGNOSIS — Z87891 Personal history of nicotine dependence: Secondary | ICD-10-CM | POA: Insufficient documentation

## 2015-05-30 DIAGNOSIS — R0789 Other chest pain: Secondary | ICD-10-CM

## 2015-05-30 DIAGNOSIS — R079 Chest pain, unspecified: Secondary | ICD-10-CM | POA: Diagnosis present

## 2015-05-30 DIAGNOSIS — J189 Pneumonia, unspecified organism: Secondary | ICD-10-CM

## 2015-05-30 LAB — CBC
HCT: 37.4 % — ABNORMAL LOW (ref 39.0–52.0)
Hemoglobin: 13 g/dL (ref 13.0–17.0)
MCH: 31.5 pg (ref 26.0–34.0)
MCHC: 34.8 g/dL (ref 30.0–36.0)
MCV: 90.6 fL (ref 78.0–100.0)
PLATELETS: 184 10*3/uL (ref 150–400)
RBC: 4.13 MIL/uL — AB (ref 4.22–5.81)
RDW: 12.5 % (ref 11.5–15.5)
WBC: 6.1 10*3/uL (ref 4.0–10.5)

## 2015-05-30 LAB — BASIC METABOLIC PANEL
Anion gap: 8 (ref 5–15)
BUN: 5 mg/dL — AB (ref 6–20)
CO2: 26 mmol/L (ref 22–32)
CREATININE: 1.06 mg/dL (ref 0.61–1.24)
Calcium: 9.6 mg/dL (ref 8.9–10.3)
Chloride: 107 mmol/L (ref 101–111)
Glucose, Bld: 122 mg/dL — ABNORMAL HIGH (ref 65–99)
Potassium: 3.5 mmol/L (ref 3.5–5.1)
SODIUM: 141 mmol/L (ref 135–145)

## 2015-05-30 LAB — I-STAT TROPONIN, ED
TROPONIN I, POC: 0 ng/mL (ref 0.00–0.08)
TROPONIN I, POC: 0 ng/mL (ref 0.00–0.08)

## 2015-05-30 MED ORDER — MORPHINE SULFATE (PF) 4 MG/ML IV SOLN
4.0000 mg | Freq: Once | INTRAVENOUS | Status: AC
  Administered 2015-05-30: 4 mg via INTRAVENOUS
  Filled 2015-05-30: qty 1

## 2015-05-30 MED ORDER — ASPIRIN 325 MG PO TABS
325.0000 mg | ORAL_TABLET | Freq: Once | ORAL | Status: AC
  Administered 2015-05-30: 325 mg via ORAL
  Filled 2015-05-30: qty 1

## 2015-05-30 MED ORDER — GI COCKTAIL ~~LOC~~
30.0000 mL | Freq: Once | ORAL | Status: AC
  Administered 2015-05-30: 30 mL via ORAL
  Filled 2015-05-30: qty 30

## 2015-05-30 NOTE — ED Notes (Signed)
Pt. reports central chest pain increases with deep inspiration and coughing onset today , denies nausea or diaphoresis .

## 2015-05-30 NOTE — ED Notes (Signed)
Vitals rechecked pt continues to have pain.  Asking for the wait time.   given

## 2015-05-30 NOTE — ED Provider Notes (Signed)
CSN: [id number]     Arrival date & time 05/30/15  1912 History   First MD Initiated Contact with Patient 05/30/15 2259     Chief Complaint  Patient presents with  . Chest Pain     (Consider location/radiation/quality/duration/timing/severity/associated sxs/prior Treatment) HPI Comments: Daniel Holmes is a 45 y.o. male with a PMHx of asthma, psychosis, hypothyroidism, and developmental delay, who presents to the ED with complaints of here with sudden onset chest pain that began while at rest around 6 PM. 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 Patient describes his pain as 10/10 constant central nonradiating aching pain which worsens with coughing and movement and with the treatments tried prior to arrival. He reports he has a chronic dry cough, he is unsure of when this cough started. He denies any fevers, chills, diaphoresis, lightheadedness, rhinorrhea, sore throat, shortness of breath, leg swelling, recent travel/surgery/immobilization, history of DVT/PE, wheezing, abdominal pain, nausea vomiting diarrhea, constipation, dysuria, hematuria, numbness, tingling, weakness, claudication, orthopnea. He is a nonsmoker. He reports a +FHx of MI in his grandfather but he doesn't seem sure about this piece of history.   Patient is a 45 y.o. male presenting with chest pain. The history is provided by the patient and medical records. The history is limited by a developmental delay. No language interpreter was used.  Chest Pain Pain location:  Substernal area Pain quality: aching   Pain radiates to:  Does not radiate Pain radiates to the back: no   Pain severity:  Severe Onset quality:  Sudden Duration:  5 hours Timing:  Constant Progression:  Unchanged Chronicity:  New Context: at rest   Relieved by:  None tried Worsened by:  Coughing and movement Ineffective treatments:  None tried Associated symptoms: cough (chronic, dry)   Associated  symptoms: no abdominal pain, no claudication, no diaphoresis, no fever, no lower extremity edema, no nausea, no numbness, no orthopnea, no shortness of breath, not vomiting and no weakness   Risk factors: male sex   Risk factors: no coronary artery disease, no diabetes mellitus, no high cholesterol, no hypertension, no immobilization, no prior DVT/PE, no smoking and no surgery     Past Medical History  Diagnosis Date  . Psychosis     See Dr Laverta [name]  . Asthma   . Mental developmental delay    Past Surgical History  Procedure Laterality Date  . Hernia repair     No family history on file. Social History  Substance Use Topics  . Smoking status: Former Smoker -- 0.00 packs/day    Types: Cigarettes  . Smokeless tobacco: None  . 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  HENT: Negative for rhinorrhea and sore throat.   Respiratory: Positive for cough (chronic, dry). Negative for shortness of breath and wheezing.   Cardiovascular: Positive for chest pain. Negative for orthopnea, claudication and leg swelling.  Gastrointestinal: Negative for nausea, vomiting, abdominal pain, diarrhea and constipation.  Genitourinary: Negative for dysuria and hematuria.  Musculoskeletal: Negative for myalgias and arthralgias.  Skin: Negative for color change.  Allergic/Immunologic: Negative for immunocompromised state.  Neurological: Negative for weakness, light-headedness and numbness.  Psychiatric/Behavioral: Negative for confusion.   10 Systems reviewed and are negative for acute change except as noted in the HPI.    Allergies  Penicillins  Home Medications   Prior to  Admission medications   Medication Sig Start Date End Date Taking? Authorizing Provider  acetaminophen (TYLENOL) 500 MG tablet Take 2 tablets (1,000 mg total) by mouth 2 (two) times daily as  needed (knee pain). 03/09/15   Lind Covert, MD  cloNIDine (CATAPRES) 0.1 MG tablet Take 1 tablet (0.1 mg total) by mouth 2 (two) times daily. 11/10/14   Clovis Fredrickson, MD  lamoTRIgine (LAMICTAL) 25 MG tablet Take 100 mg by mouth at bedtime.    Historical Provider, MD  levothyroxine (SYNTHROID, LEVOTHROID) 75 MCG tablet TAKE (1) TABLET BY MOUTH ONCE DAILY BEFORE BREAKFAST. 03/09/15   Lind Covert, MD  lithium carbonate (ESKALITH) 450 MG CR tablet Take 1 tablet (450 mg total) by mouth every 12 (twelve) hours. 11/10/14   Clovis Fredrickson, MD  Multiple Vitamins-Minerals (MULTIVITAMIN ADULTS 50+) TABS Take 1 tablet by mouth daily. 11/10/14   Clovis Fredrickson, MD  OLANZapine (ZYPREXA) 20 MG tablet Take 1.5 tablets (30 mg total) by mouth at bedtime. Take 1 and 1/2 tablets by mouth at bedtime. Patient taking differently: Take 30 mg by mouth daily. Take 1 and 1/2 tablets by mouth at bedtime. 11/10/14   Clovis Fredrickson, MD  traZODone (DESYREL) 150 MG tablet Take 1 tablet (150 mg total) by mouth at bedtime. Patient taking differently: Take 150 mg by mouth 2 (two) times daily.  11/10/14   Clovis Fredrickson, MD  trolamine salicylate (ASPERCREME/ALOE) 10 % cream Apply 1 application topically 4 (four) times daily as needed for muscle pain. 12/02/14   Lind Covert, MD  Vitamin D, Ergocalciferol, (DRISDOL) 50000 UNITS CAPS capsule Take 1 capsule (50,000 Units total) by mouth every 14 (fourteen) days. 11/10/14   Jolanta B Pucilowska, MD   BP 122/77 mmHg  Pulse 81  Temp(Src) 98.2 F (36.8 C) (Oral)  Resp 18  SpO2 98% Physical Exam  Constitutional: He is oriented to person, place, and time. Vital signs are normal. He appears well-developed and well-nourished.  Non-toxic appearance. No distress.  Afebrile, nontoxic, NAD  HENT:  Head: Normocephalic and atraumatic.  Mouth/Throat: Oropharynx is clear and moist and mucous membranes are normal.  Edentulous   Eyes: Conjunctivae  and EOM are normal. Right eye exhibits no discharge. Left eye exhibits no discharge.  Neck: Normal range of motion. Neck supple.  Cardiovascular: Normal rate, regular rhythm, normal heart sounds and intact distal pulses.  Exam reveals no gallop and no friction rub.   No murmur heard. RRR, nl s1/s2, no m/r/g, distal pulses intact, no pedal edema   Pulmonary/Chest: Effort normal. No respiratory distress. He has decreased breath sounds in the right lower field and the left lower field. He has no wheezes. He has no rhonchi. He has no rales. He exhibits tenderness. He exhibits no crepitus, no deformity and no retraction.    Slightly diminished sounds in bibasilar areas, difficult to assess if this is due to poor inspiratory effort vs truly diminished sounds, no w/r/r, no hypoxia or increased WOB, speaking in full sentences, SpO2 99% on RA  Chest wall mildly TTP over sternum, no crepitus or retractions, no deformities  Abdominal: Soft. Normal appearance and bowel sounds are normal. He exhibits no distension. There is no tenderness. There is no rigidity, no rebound, no guarding, no CVA tenderness, no tenderness at McBurney's point and negative Murphy's sign.  Musculoskeletal: Normal range of motion.  MAE x4 Strength and sensation grossly intact Distal pulses intact No pedal edema, neg homan's bilaterally   Neurological: He is  alert and oriented to person, place, and time. He has normal strength. No sensory deficit.  Skin: Skin is warm, dry and intact. No rash noted.  Psychiatric: He has a normal mood and affect.  Nursing note and vitals reviewed.   ED Course  Procedures (including critical care time) Labs Review Labs Reviewed  BASIC METABOLIC PANEL - Abnormal; Notable for the following:    Glucose, Bld 122 (*)    BUN 5 (*)    All other components within normal limits  CBC - Abnormal; Notable for the following:    RBC 4.13 (*)    HCT 37.4 (*)    All other components within normal limits    I-STAT TROPOININ, ED  Randolm Idol, ED    Imaging Review Dg Chest 2 View  05/30/2015  CLINICAL DATA:  Chest pain for couple of hours. EXAM: CHEST  2 VIEW COMPARISON:  02/16/2015. FINDINGS: Two views study shows low lung volumes. There is patchy opacity at the bases that is likely related to atelectasis given the low volumes although superimposed infection is not excluded, specially at the right base. The cardiopericardial silhouette is within normal limits for size. The visualized bony structures of the thorax are intact. IMPRESSION: Low volume film with right greater than left basilar patchy opacity, likely representing atelectasis although infection is not excluded. Electronically Signed   By: Misty Stanley M.D.   On: 05/30/2015 19:56   I have personally reviewed and evaluated these images and lab results as part of my medical decision-making.   EKG Interpretation   Date/Time:  Sunday May 30 2015 19:13:38 EST Ventricular Rate:  82 PR Interval:  140 QRS Duration: 90 QT Interval:  392 QTC Calculation: 457 R Axis:   49 Text Interpretation:  Normal sinus rhythm Normal ECG No old tracing to  compare Confirmed by FLOYD MD, DANIEL [id number]) on 05/31/2015 12:07:59 AM      MDM   Final diagnoses:  Cough  Costochondritis  Chest wall pain  Community acquired pneumonia    45 y.o. male here with CP onset 6pm, and chronic dry cough. Pt poor historian due to intellectual disability. Nursing note states deep inspiration increases pain but pt states that movement and coughing increase his pain. No tachycardia, no hypoxia, no LE swelling, no complaints of SOB, and PERC negative, therefore doubt PE. Trop neg, EKG unremarkable, BMP WNL, CBC WNL. CXR showing bibasilar patchy opacity R>L which could be infectious vs due to atelectasis. Pt with some diminished sounds in these areas, therefore this could indicate a developing PNA. Pt with reproducible CP, likely costochondral from coughing. Will give  ASA, GI cocktail, and morphine to see if this helps his symptoms, and repeat troponin since it's been 3hrs. HEART score 2, doubt need for admission as long as his second trop is negative. Will reassess shortly.   12:18 AM Second trop neg, pain improved with morphine, GI cocktail, and ASA. Pt feels well to go home. Will send home with tylenol #3 and naprosyn for pain, and levaquin for treatment of possible PNA. Will have him f/up with his PCP in 5-7 days for recheck of symptoms. Discussed use of heat over areas of soreness as needed. I explained the diagnosis and have given explicit precautions to return to the ER including for any other new or worsening symptoms. The patient understands and accepts the medical plan as it's been dictated and I have answered their questions. Discharge instructions concerning home care and prescriptions have been given. The patient  is STABLE and is discharged to home in good condition.   BP 110/73 mmHg  Pulse 71  Temp(Src) 98.2 F (36.8 C) (Oral)  Resp 20  SpO2 98%  Meds ordered this encounter  Medications  . gi cocktail (Maalox,Lidocaine,Donnatal)    Sig:   . morphine 4 MG/ML injection 4 mg    Sig:   . aspirin tablet 325 mg    Sig:   . levofloxacin (LEVAQUIN) 750 MG tablet    Sig: Take 1 tablet (750 mg total) by mouth daily. X 7 days    Dispense:  7 tablet    Refill:  0    Order Specific Question:  Supervising Provider    Answer:  MILLER, BRIAN [[id number]]  . naproxen (NAPROSYN) 250 MG tablet    Sig: Take 1 tablet (250 mg total) by mouth 2 (two) times daily as needed for mild pain or moderate pain (TAKE WITH MEALS.).    Dispense:  20 tablet    Refill:  0    Order Specific Question:  Supervising Provider    Answer:  MILLER, BRIAN [[id number]]  . acetaminophen-codeine (TYLENOL #3) 300-30 MG tablet    Sig: Take 1 tablet by mouth every 6 (six) hours as needed for moderate pain.    Dispense:  12 tablet    Refill:  0    Order Specific Question:  Supervising Provider     Answer:  Noemi Chapel [[id number]]     Jenita Rayfield Camprubi-Soms, PA-C 05/31/15 [place], DO 05/31/15 0031

## 2015-05-30 NOTE — ED Notes (Signed)
Caregiver, Heide Scales, to be contacted for discharge. [phone]

## 2015-05-31 MED ORDER — ACETAMINOPHEN-CODEINE #3 300-30 MG PO TABS: 1.0000 | ORAL_TABLET | Freq: Four times a day (QID) | ORAL | Status: DC | PRN

## 2015-05-31 MED ORDER — NAPROXEN 250 MG PO TABS: 250.0000 mg | ORAL_TABLET | Freq: Two times a day (BID) | ORAL | Status: DC | PRN

## 2015-05-31 MED ORDER — LEVOFLOXACIN 750 MG PO TABS: 750.0000 mg | ORAL_TABLET | Freq: Every day | ORAL | Status: DC

## 2015-05-31 NOTE — Discharge Instructions (Signed)
Your chest xray shows a possible pneumonia, which could be causing you to have pain due to coughing. Take antibiotic as directed. Continue to stay well-hydrated. Alternate between Tylenol #3 and naprosyn as directed as needed for pain. Use Mucinex for cough suppression/expectoration of mucus. May consider over-the-counter Benadryl or other antihistamine to decrease secretions and to help with symptoms. Use heat to the areas of soreness as needed for pain. Followup with your primary care doctor in 5-7 days for recheck of ongoing symptoms. Return to emergency department for emergent changing or worsening of symptoms.   Costochondritis Costochondritis, sometimes called Tietze syndrome, is a swelling and irritation (inflammation) of the tissue (cartilage) that connects your ribs with your breastbone (sternum). It causes pain in the chest and rib area. Costochondritis usually goes away on its own over time. It can take up to 6 weeks or longer to get better, especially if you are unable to limit your activities. CAUSES  Some cases of costochondritis have no known cause. Possible causes include:  Injury (trauma).  Exercise or activity such as lifting.  Severe coughing. SIGNS AND SYMPTOMS  Pain and tenderness in the chest and rib area.  Pain that gets worse when coughing or taking deep breaths.  Pain that gets worse with specific movements. DIAGNOSIS  Your health care provider will do a physical exam and ask about your symptoms. Chest X-rays or other tests may be done to rule out other problems. TREATMENT  Costochondritis usually goes away on its own over time. Your health care provider may prescribe medicine to help relieve pain. HOME CARE INSTRUCTIONS   Avoid exhausting physical activity. Try not to strain your ribs during normal activity. This would include any activities using chest, abdominal, and side muscles, especially if heavy weights are used.  Apply ice to the affected area for the  first 2 days after the pain begins.  Put ice in a plastic bag.  Place a towel between your skin and the bag.  Leave the ice on for 20 minutes, 2-3 times a day.  Only take over-the-counter or prescription medicines as directed by your health care provider. SEEK MEDICAL CARE IF:  You have redness or swelling at the rib joints. These are signs of infection.  Your pain does not go away despite rest or medicine. SEEK IMMEDIATE MEDICAL CARE IF:   Your pain increases or you are very uncomfortable.  You have shortness of breath or difficulty breathing.  You cough up blood.  You have worse chest pains, sweating, or vomiting.  You have a fever or persistent symptoms for more than 2-3 days.  You have a fever and your symptoms suddenly get worse. MAKE SURE YOU:   Understand these instructions.  Will watch your condition.  Will get help right away if you are not doing well or get worse.   This information is not intended to replace advice given to you by your health care provider. Make sure you discuss any questions you have with your health care provider.   Document Released: 02/22/2005 Document Revised: 03/05/2013 Document Reviewed: 12/17/2012 Elsevier Interactive Patient Education 2016 Elsevier Inc[employer]  Chest Wall Pain Chest wall pain is pain in or around the bones and muscles of your chest. Sometimes, an injury causes this pain. Sometimes, the cause may not be known. This pain may take several weeks or longer to get better. HOME CARE Pay attention to any changes in your symptoms. Take these actions to help with your pain:  Rest as told by  your doctor.  Avoid activities that cause pain. Try not to use your chest, belly (abdominal), or side muscles to lift heavy things.  If directed, apply ice to the painful area:  Put ice in a plastic bag.  Place a towel between your skin and the bag.  Leave the ice on for 20 minutes, 2-3 times per day.  Take over-the-counter and  prescription medicines only as told by your doctor.  Do not use tobacco products, including cigarettes, chewing tobacco, and e-cigarettes. If you need help quitting, ask your doctor.  Keep all follow-up visits as told by your doctor. This is important. GET HELP IF:  You have a fever.  Your chest pain gets worse.  You have new symptoms. GET HELP RIGHT AWAY IF:  You feel sick to your stomach (nauseous) or you throw up (vomit).  You feel sweaty or light-headed.  You have a cough with phlegm (sputum) or you cough up blood.  You are short of breath.   This information is not intended to replace advice given to you by your health care provider. Make sure you discuss any questions you have with your health care provider.   Document Released: 11/01/2007 Document Revised: 02/03/2015 Document Reviewed: 08/10/2014 Elsevier Interactive Patient Education 2016 [place] Pneumonia, Adult Pneumonia is an infection of the lungs. One type of pneumonia can happen while a person is in a hospital. A different type can happen when a person is not in a hospital (community-acquired pneumonia). It is easy for this kind to spread from person to person. It can spread to you if you breathe near an infected person who coughs or sneezes. Some symptoms include:  A dry cough.  A wet (productive) cough.  Fever.  Sweating.  Chest pain. HOME CARE  Take over-the-counter and prescription medicines only as told by your doctor.  Only take cough medicine if you are losing sleep.  If you were prescribed an antibiotic medicine, take it as told by your doctor. Do not stop taking the antibiotic even if you start to feel better.  Sleep with your head and neck raised (elevated). You can do this by putting a few pillows under your head, or you can sleep in a recliner.  Do not use tobacco products. These include cigarettes, chewing tobacco, and e-cigarettes. If you need help quitting, ask  your doctor.  Drink enough water to keep your pee (urine) clear or pale yellow. A shot (vaccine) can help prevent pneumonia. Shots are often suggested for:  People older than 47 years of age.  People older than 47 years of age:  Who are having cancer treatment.  Who have long-term (chronic) lung disease.  Who have problems with their body's defense system (immune system). You may also prevent pneumonia if you take these actions:  Get the flu (influenza) shot every year.  Go to the dentist as often as told.  Wash your hands often. If soap and water are not available, use hand sanitizer. GET HELP IF:  You have a fever.  You lose sleep because your cough medicine does not help. GET HELP RIGHT AWAY IF:  You are short of breath and it gets worse.  You have more chest pain.  Your sickness gets worse. This is very serious if:  You are an older adult.  Your body's defense system is weak.  You cough up blood.   This information is not intended to replace advice given to you by your health care provider. Make  sure you discuss any questions you have with your health care provider.   Document Released: 11/01/2007 Document Revised: 02/03/2015 Document Reviewed: 09/09/2014 Elsevier Interactive Patient Education 2016 Elsevier Inc.  Cough, Adult A cough helps to clear your throat and lungs. A cough may last only 2-3 weeks (acute), or it may last longer than 8 weeks (chronic). Many different things can cause a cough. A cough may be a sign of an illness or another medical condition. HOME CARE  Pay attention to any changes in your cough.  Take medicines only as told by your doctor.  If you were prescribed an antibiotic medicine, take it as told by your doctor. Do not stop taking it even if you start to feel better.  Talk with your doctor before you try using a cough medicine.  Drink enough fluid to keep your pee (urine) clear or pale yellow.  If the air is dry, use a cold  steam vaporizer or humidifier in your home.  Stay away from things that make you cough at work or at home.  If your cough is worse at night, try using extra pillows to raise your head up higher while you sleep.  Do not smoke, and try not to be around smoke. If you need help quitting, ask your doctor.  Do not have caffeine.  Do not drink alcohol.  Rest as needed. GET HELP IF:  You have new problems (symptoms).  You cough up yellow fluid (pus).  Your cough does not get better after 2-3 weeks, or your cough gets worse.  Medicine does not help your cough and you are not sleeping well.  You have pain that gets worse or pain that is not helped with medicine.  You have a fever.  You are losing weight and you do not know why.  You have night sweats. GET HELP RIGHT AWAY IF:  You cough up blood.  You have trouble breathing.  Your heartbeat is very fast.   This information is not intended to replace advice given to you by your health care provider. Make sure you discuss any questions you have with your health care provider.   Document Released: 01/26/2011 Document Revised: 02/03/2015 Document Reviewed: 07/22/2014 Elsevier Interactive Patient Education [employer].

## 2015-06-09 ENCOUNTER — Ambulatory Visit (INDEPENDENT_AMBULATORY_CARE_PROVIDER_SITE_OTHER): Payer: Medicaid Other | Admitting: Family Medicine

## 2015-06-09 ENCOUNTER — Encounter: Payer: Self-pay | Admitting: Family Medicine

## 2015-06-09 VITALS — BP 122/74 | HR 83 | Temp 98.2°F | Wt 181.0 lb

## 2015-06-09 DIAGNOSIS — R0789 Other chest pain: Secondary | ICD-10-CM | POA: Diagnosis present

## 2015-06-09 MED ORDER — OMEPRAZOLE 10 MG PO CPDR: 10.0000 mg | DELAYED_RELEASE_CAPSULE | Freq: Every day | ORAL | Status: DC

## 2015-06-09 MED ORDER — ACETAMINOPHEN 500 MG PO TABS: 1000.0000 mg | ORAL_TABLET | Freq: Two times a day (BID) | ORAL | Status: DC | PRN

## 2015-06-09 MED ORDER — GUAIFENESIN 200 MG/5ML PO LIQD: 200.0000 mg | Freq: Three times a day (TID) | ORAL | Status: DC | PRN

## 2015-06-09 NOTE — Patient Instructions (Addendum)
Good to see you today!  Thanks for coming in.  Take the guafenisin three times daily as needed   Take the omeprazole as needed for heart burn  Cut down to one soda a day  Cut way down on sweets - cakes pies and breads  Come back in 6 months

## 2015-06-09 NOTE — Progress Notes (Signed)
   Subjective:    Patient ID: Daniel Holmes, male    DOB: [birth year], 45 y.o.   MRN: [id number]  HPI  Chest Pain an Pneumonia Follow up from ER.  He feels better has finished antibiotics.  No fever or shortness of breath or chest pain or rash  ERnotes reviewed - labs, CT and note  Chief Complaint noted Review of Symptoms - see HPI PMH - Smoking status noted.   Vital Signs reviewed  Review of Systems     Objective:   Physical Exam  Alert nad Heart - Regular rate and rhythm.  No murmurs, gallops or rubs.    Lungs:  Normal respiratory effort, chest expands symmetrically. Lungs are clear to auscultation, no crackles or wheezes. Abdomen: soft and non-tender without masses, organomegaly or hernias noted.  No guarding or rebound Extremities:  No cyanosis, edema, or deformity noted with good range of motion of all major joints.         Assessment & Plan:   Pneumonia and Chest pain New problem Improved.   Discussed warning signs

## 2015-06-14 ENCOUNTER — Encounter (HOSPITAL_COMMUNITY): Payer: Self-pay

## 2015-06-14 DIAGNOSIS — F29 Unspecified psychosis not due to a substance or known physiological condition: Secondary | ICD-10-CM | POA: Insufficient documentation

## 2015-06-14 DIAGNOSIS — Z23 Encounter for immunization: Secondary | ICD-10-CM | POA: Diagnosis not present

## 2015-06-14 DIAGNOSIS — Y998 Other external cause status: Secondary | ICD-10-CM | POA: Insufficient documentation

## 2015-06-14 DIAGNOSIS — Y9289 Other specified places as the place of occurrence of the external cause: Secondary | ICD-10-CM | POA: Insufficient documentation

## 2015-06-14 DIAGNOSIS — S50812A Abrasion of left forearm, initial encounter: Secondary | ICD-10-CM | POA: Diagnosis not present

## 2015-06-14 DIAGNOSIS — T1491 Suicide attempt: Secondary | ICD-10-CM | POA: Diagnosis present

## 2015-06-14 DIAGNOSIS — Y9389 Activity, other specified: Secondary | ICD-10-CM | POA: Insufficient documentation

## 2015-06-14 DIAGNOSIS — Z79899 Other long term (current) drug therapy: Secondary | ICD-10-CM | POA: Diagnosis not present

## 2015-06-14 DIAGNOSIS — X788XXA Intentional self-harm by other sharp object, initial encounter: Secondary | ICD-10-CM | POA: Diagnosis not present

## 2015-06-14 DIAGNOSIS — Z87891 Personal history of nicotine dependence: Secondary | ICD-10-CM | POA: Insufficient documentation

## 2015-06-14 DIAGNOSIS — J45909 Unspecified asthma, uncomplicated: Secondary | ICD-10-CM | POA: Diagnosis not present

## 2015-06-14 DIAGNOSIS — Z880 Allergy status to penicillin: Secondary | ICD-10-CM | POA: Diagnosis not present

## 2015-06-14 NOTE — ED Notes (Signed)
Pt here from [facility] which is [facility] on one. Has been there since October. Staff member saying today is the first time he is hearing pt say he doesn't like it there. Pt brought in today for the first time for wanting to hurt himself. Tried to hang himself. Has superficial scratch marks to right forearm made by his fingernails.

## 2015-06-15 ENCOUNTER — Emergency Department (HOSPITAL_COMMUNITY)
Admission: EM | Admit: 2015-06-15 | Discharge: 2015-06-15 | Disposition: A | Discharge status: Home / Self Care | Payer: Medicaid Other | Attending: Emergency Medicine | Admitting: Emergency Medicine

## 2015-06-15 ENCOUNTER — Encounter (HOSPITAL_COMMUNITY): Payer: Self-pay | Admitting: Emergency Medicine

## 2015-06-15 DIAGNOSIS — R45851 Suicidal ideations: Secondary | ICD-10-CM

## 2015-06-15 DIAGNOSIS — T148XXA Other injury of unspecified body region, initial encounter: Secondary | ICD-10-CM

## 2015-06-15 LAB — CBC
HEMATOCRIT: 38 % — AB (ref 39.0–52.0)
HEMOGLOBIN: 13.1 g/dL (ref 13.0–17.0)
MCH: 31.3 pg (ref 26.0–34.0)
MCHC: 34.5 g/dL (ref 30.0–36.0)
MCV: 90.7 fL (ref 78.0–100.0)
Platelets: 214 10*3/uL (ref 150–400)
RBC: 4.19 MIL/uL — ABNORMAL LOW (ref 4.22–5.81)
RDW: 12.9 % (ref 11.5–15.5)
WBC: 9 10*3/uL (ref 4.0–10.5)

## 2015-06-15 LAB — COMPREHENSIVE METABOLIC PANEL
ALT: 18 U/L (ref 17–63)
ANION GAP: 11 (ref 5–15)
AST: 29 U/L (ref 15–41)
Albumin: 4.4 g/dL (ref 3.5–5.0)
Alkaline Phosphatase: 77 U/L (ref 38–126)
BILIRUBIN TOTAL: 0.5 mg/dL (ref 0.3–1.2)
BUN: 6 mg/dL (ref 6–20)
CO2: 22 mmol/L (ref 22–32)
Calcium: 9.8 mg/dL (ref 8.9–10.3)
Chloride: 106 mmol/L (ref 101–111)
Creatinine, Ser: 0.88 mg/dL (ref 0.61–1.24)
GFR calc Af Amer: >60 mL/min (ref >60)
Glucose, Bld: 119 mg/dL — ABNORMAL HIGH (ref 65–99)
POTASSIUM: 4.3 mmol/L (ref 3.5–5.1)
Sodium: 139 mmol/L (ref 135–145)
TOTAL PROTEIN: 6.8 g/dL (ref 6.5–8.1)

## 2015-06-15 LAB — RAPID URINE DRUG SCREEN, HOSP PERFORMED
AMPHETAMINES: NOT DETECTED
BARBITURATES: NOT DETECTED
BENZODIAZEPINES: NOT DETECTED
COCAINE: NOT DETECTED
Opiates: NOT DETECTED
TETRAHYDROCANNABINOL: NOT DETECTED

## 2015-06-15 LAB — SALICYLATE LEVEL: Salicylate Lvl: <4 mg/dL (ref 2.8–30.0)

## 2015-06-15 LAB — ACETAMINOPHEN LEVEL: Acetaminophen (Tylenol), Serum: <10 ug/mL — ABNORMAL LOW (ref 10–30)

## 2015-06-15 LAB — ETHANOL

## 2015-06-15 MED ORDER — NAPROXEN 250 MG PO TABS: 250.0000 mg | ORAL_TABLET | Freq: Two times a day (BID) | ORAL | Status: DC | PRN

## 2015-06-15 MED ORDER — TETANUS-DIPHTH-ACELL PERTUSSIS 5-2.5-18.5 LF-MCG/0.5 IM SUSP
0.5000 mL | Freq: Once | INTRAMUSCULAR | Status: AC
  Administered 2015-06-15: 0.5 mL via INTRAMUSCULAR
  Filled 2015-06-15: qty 0.5

## 2015-06-15 MED ORDER — CLONIDINE HCL 0.1 MG PO TABS
0.1000 mg | ORAL_TABLET | Freq: Two times a day (BID) | ORAL | Status: DC
  Administered 2015-06-15: 0.1 mg via ORAL
  Filled 2015-06-15: qty 1

## 2015-06-15 MED ORDER — LAMOTRIGINE 100 MG PO TABS: 100.0000 mg | ORAL_TABLET | Freq: Every day | ORAL | Status: DC

## 2015-06-15 MED ORDER — LEVOTHYROXINE SODIUM 75 MCG PO TABS
75.0000 ug | ORAL_TABLET | Freq: Every day | ORAL | Status: DC
  Filled 2015-06-15: qty 1

## 2015-06-15 MED ORDER — TRAZODONE HCL 50 MG PO TABS: 150.0000 mg | ORAL_TABLET | Freq: Every day | ORAL | Status: DC

## 2015-06-15 MED ORDER — ACETAMINOPHEN 500 MG PO TABS: 1000.0000 mg | ORAL_TABLET | Freq: Two times a day (BID) | ORAL | Status: DC | PRN

## 2015-06-15 MED ORDER — OLANZAPINE 5 MG PO TABS: 30.0000 mg | ORAL_TABLET | Freq: Every day | ORAL | Status: DC

## 2015-06-15 MED ORDER — LITHIUM CARBONATE ER 450 MG PO TBCR
450.0000 mg | EXTENDED_RELEASE_TABLET | Freq: Two times a day (BID) | ORAL | Status: DC
  Administered 2015-06-15: 450 mg via ORAL
  Filled 2015-06-15 (×2): qty 1

## 2015-06-15 NOTE — ED Notes (Signed)
Staffing called for sitter. No one available at this time.

## 2015-06-15 NOTE — ED Provider Notes (Signed)
CSN: [id number]     Arrival date & time 06/14/15  2346 History  By signing my name below, I, Daniel Holmes, attest that this documentation has been prepared under the direction and in the presence of [name], MD. Electronically Signed: [name], ED Scribe. 06/15/2015. 2:49 AM.   Chief Complaint  Patient presents with  . Suicide Attempt   The history is provided by the patient. No language interpreter was used.     HPI Comments: Daniel Holmes is a 45 y.o. male with hx psychosis and mental developmental delay who presents to the Emergency Department for psychiatric evaluation after suicide attempt tonight. Pt was brought in from [name] after he tried to hang himself. Pt reports that the staff at [facility] stopped him before he could hang himself. He states that he wants to kill himself because he doesn't like himself anymore and he has been hearing voices that are telling him to hurt himself. Pt has abrasions to right forearm from scratching himself. He states that he has been complaint with his medication. Pt denies visual hallucinations or any other associated symptoms. No EtOH or illicit drug use.   At baseline, patient is difficult to understand.   Past Medical History  Diagnosis Date  . Psychosis     See Dr Laverta [place] [name]  . Asthma   . Mental developmental delay    Past Surgical History  Procedure Laterality Date  . Hernia repair     No family history on file. Social History  Substance Use Topics  . Smoking status: Former Smoker -- 0.00 packs/day    Types: Cigarettes  . Smokeless tobacco: None  . Alcohol Use: No    Review of Systems  Musculoskeletal: Positive for arthralgias.  Psychiatric/Behavioral: Positive for suicidal ideas and hallucinations.  All other systems reviewed and are negative.     Allergies  Penicillins  Home Medications   Prior to Admission medications   Medication Sig Start Date End Date Taking?  Authorizing Provider  acetaminophen (TYLENOL) 500 MG tablet Take 2 tablets (1,000 mg total) by mouth 2 (two) times daily as needed (knee pain). 06/09/15   Lind Covert, MD  cloNIDine (CATAPRES) 0.1 MG tablet Take 1 tablet (0.1 mg total) by mouth 2 (two) times daily. 11/10/14   Clovis Fredrickson, MD  Guaifenesin 200 MG/5ML LIQD Take 5 mLs (200 mg total) by mouth 3 (three) times daily as needed. For cough 06/09/15   Lind Covert, MD  lamoTRIgine (LAMICTAL) 25 MG tablet Take 100 mg by mouth at bedtime.    Historical Provider, MD  levothyroxine (SYNTHROID, LEVOTHROID) 75 MCG tablet TAKE (1) TABLET BY MOUTH ONCE DAILY BEFORE BREAKFAST. 03/09/15   Lind Covert, MD  lithium carbonate (ESKALITH) 450 MG CR tablet Take 1 tablet (450 mg total) by mouth every 12 (twelve) hours. 11/10/14   Clovis Fredrickson, MD  Multiple Vitamins-Minerals (MULTIVITAMIN ADULTS 50+) TABS Take 1 tablet by mouth daily. 11/10/14   Clovis Fredrickson, MD  naproxen (NAPROSYN) 250 MG tablet Take 1 tablet (250 mg total) by mouth 2 (two) times daily as needed for mild pain or moderate pain (TAKE WITH MEALS.). 05/31/15   Mercedes Camprubi-Soms, PA-C  OLANZapine (ZYPREXA) 20 MG tablet Take 1.5 tablets (30 mg total) by mouth at bedtime. Take 1 and 1/2 tablets by mouth at bedtime. Patient taking differently: Take 30 mg by mouth daily. Take 1 and 1/2 tablets by mouth at bedtime. 11/10/14  Clovis Fredrickson, MD  omeprazole (PRILOSEC) 10 MG capsule Take 1 capsule (10 mg total) by mouth daily. 06/09/15   Lind Covert, MD  traZODone (DESYREL) 150 MG tablet Take 1 tablet (150 mg total) by mouth at bedtime. Patient taking differently: Take 150 mg by mouth 2 (two) times daily.  11/10/14   Clovis Fredrickson, MD  trolamine salicylate (ASPERCREME/ALOE) 10 % cream Apply 1 application topically 4 (four) times daily as needed for muscle pain. 12/02/14   Lind Covert, MD  Vitamin D, Ergocalciferol, (DRISDOL) 50000  UNITS CAPS capsule Take 1 capsule (50,000 Units total) by mouth every 14 (fourteen) days. 11/10/14   Clovis Fredrickson, MD   Triage Vitals:  BP 122/79 mmHg  Pulse 88  Temp(Src) 98.3 F (36.8 C) (Oral)  Resp 22  SpO2 98%   Physical Exam  Constitutional: He is oriented to person, place, and time. He appears well-developed and well-nourished.  HENT:  Head: Normocephalic and atraumatic.  No evidence of petechiae  Neck: Normal range of motion. Neck supple.  No evidence of abrasion or contusion  Cardiovascular: Normal rate, regular rhythm and normal heart sounds.   No murmur heard. Pulmonary/Chest: Effort normal and breath sounds normal. No respiratory distress. He has no wheezes.  Abdominal: Soft. There is no tenderness.  Neurological: He is alert and oriented to person, place, and time.  Skin: Skin is warm and dry.  Extensive excoriation to the left forearm  Psychiatric: He has a normal mood and affect.  Nursing note and vitals reviewed.   ED Course  Procedures (including critical care time)  DIAGNOSTIC STUDIES: Oxygen Saturation is 98% on RA, normal by my interpretation.    COORDINATION OF CARE: 2:49 AM-Discussed treatment plan which includes consult with TTS with pt at bedside and pt agreed to plan.   Labs Review Labs Reviewed  COMPREHENSIVE METABOLIC PANEL - Abnormal; Notable for the following:    Glucose, Bld 119 (*)    All other components within normal limits  ACETAMINOPHEN LEVEL - Abnormal; Notable for the following:    Acetaminophen (Tylenol), Serum <10 (*)    All other components within normal limits  CBC - Abnormal; Notable for the following:    RBC 4.19 (*)    HCT 38.0 (*)    All other components within normal limits  ETHANOL  SALICYLATE LEVEL  URINE RAPID DRUG SCREEN, HOSP PERFORMED    Imaging Review No results found.   EKG Interpretation None      MDM   Final diagnoses:  Suicidal ideation  Superficial abrasion   Patient presents with  suicidal ideation. Reported attempt. It is unclear whether the patient said he was given headache himself or actually tried to hang himself. He did scratch his right forearm extensively. Lab work is reassuring. He is medically clear based on physical exam. Will have TTS consulted.  After history, exam, and medical workup I feel the patient has been appropriately medically screened and is safe for discharge home. Pertinent diagnoses were discussed with the patient. Patient was given return precautions.  I personally performed the services described in this documentation, which was scribed in my presence. The recorded information has been reviewed and is accurate.      [name], MD 06/15/15 [phone]

## 2015-06-15 NOTE — Discharge Instructions (Signed)
Stress and Stress Management °Stress is a normal reaction to life events. It is what you feel when life demands more than you are used to or more than you can handle. Some stress can be useful. For example, the stress reaction can help you catch the last bus of the day, study for a test, or meet a deadline at work. But stress that occurs too often or for too long can cause problems. It can affect your emotional health and interfere with relationships and normal daily activities. Too much stress can weaken your immune system and increase your risk for physical illness. If you already have a medical problem, stress can make it worse. °CAUSES  °All sorts of life events may cause stress. An event that causes stress for one person may not be stressful for another person. Major life events commonly cause stress. These may be positive or negative. Examples include losing your job, moving into a new home, getting married, having a baby, or losing a loved one. Less obvious life events may also cause stress, especially if they occur day after day or in combination. Examples include working long hours, driving in traffic, caring for children, being in debt, or being in a difficult relationship. °SIGNS AND SYMPTOMS °Stress may cause emotional symptoms including, the following: °· Anxiety. This is feeling worried, afraid, on edge, overwhelmed, or out of control. °· Anger. This is feeling irritated or impatient. °· Depression. This is feeling sad, down, helpless, or guilty. °· Difficulty focusing, remembering, or making decisions. °Stress may cause physical symptoms, including the following:  °· Aches and pains. These may affect your head, neck, back, stomach, or other areas of your body. °· Tight muscles or clenched jaw. °· Low energy or trouble sleeping.  °Stress may cause unhealthy behaviors, including the following:  °· Eating to feel better (overeating) or skipping meals. °· Sleeping too little, too much, or both. °· Working  too much or putting off tasks (procrastination). °· Smoking, drinking alcohol, or using drugs to feel better. °DIAGNOSIS  °Stress is diagnosed through an assessment by your health care provider. Your health care provider will ask questions about your symptoms and any stressful life events. Your health care provider will also ask about your medical history and may order blood tests or other tests. Certain medical conditions and medicine can cause physical symptoms similar to stress.  Mental illness can cause emotional symptoms and unhealthy behaviors similar to stress. Your health care provider may refer you to a mental health professional for further evaluation.  °TREATMENT  °Stress management is the recommended treatment for stress. The goals of stress management are reducing stressful life events and coping with stress in healthy ways.  °Techniques for reducing stressful life events include the following: °· Stress identification. Self-monitor for stress and identify what causes stress for you. These skills may help you to avoid some stressful events. °· Time management. Set your priorities, keep a calendar of events, and learn to say "no." These tools can help you avoid making too many commitments. °Techniques for coping with stress include the following: °· Rethinking the problem. Try to think realistically about stressful events rather than ignoring them or overreacting. Try to find the positives in a stressful situation rather than focusing on the negatives. °· Exercise. Physical exercise can release both physical and emotional tension. The key is to find a form of exercise you enjoy and do it regularly. °· Relaxation techniques. These relax the body and mind. Examples include yoga, meditation, tai chi, biofeedback, deep   breathing, progressive muscle relaxation, listening to music, being out in nature, journaling, and other hobbies. Again, the key is to find one or more that you enjoy and can do  regularly.  Healthy lifestyle. Eat a balanced diet, get plenty of sleep, and do not smoke. Avoid using alcohol or drugs to relax.  Strong support network. Spend time with family, friends, or other people you enjoy being around.Express your feelings and talk things over with someone you trust. Counseling or talktherapy with a mental health professional may be helpful if you are having difficulty managing stress on your own. Medicine is typically not recommended for the treatment of stress.Talk to your health care provider if you think you need medicine for symptoms of stress. HOME CARE INSTRUCTIONS  Keep all follow-up visits as directed by your health care provider.  Take all medicines as directed by your health care provider. SEEK MEDICAL CARE IF:  Your symptoms get worse or you start having new symptoms.  You feel overwhelmed by your problems and can no longer manage them on your own. SEEK IMMEDIATE MEDICAL CARE IF:  You feel like hurting yourself or someone else.   This information is not intended to replace advice given to you by your health care provider. Make sure you discuss any questions you have with your health care provider.   Document Released: 11/08/2000 Document Revised: 06/05/2014 Document Reviewed: 01/07/2013 Elsevier Interactive Patient Education [employer].

## 2015-06-15 NOTE — ED Notes (Signed)
Pt returned to room and is talking w/sitter.

## 2015-06-15 NOTE — ED Notes (Signed)
TTS completed. 

## 2015-06-15 NOTE — [facility] Counselor (Signed)
[facility] Assessment Progress Note  Counselor was advised by Jacqlyn Larsen, RN, that pt indicated he was ready to go home. When he discovered that he was not going to a "new place" as he thought, he started to say that he wanted to kill himself again.  Counselor spoke to pt again via tele-machine and he said he still wanted to kill himself. Case consulted with Elmarie Shiley, NP, and it has been determined that pt is not a danger to himself, but is endorsing SI for the secondary gain of switching placements. Counselor called pt's paraprofessional, [name] [phone]), and made him aware of pt's endorsements and the determination made. [name] is agreeable to picking pt up and stated he was on his way to the hospital.   Kenna Gilbert. Lovena Le, [place], [place], [facility] Counselor

## 2015-06-15 NOTE — ED Notes (Signed)
Patient was given a snack and drink. A regular diet taken for lunch.

## 2015-06-15 NOTE — ED Notes (Signed)
Would prefer to be called Daniel Holmes. Doesn't like his name Daniel Holmes.

## 2015-06-15 NOTE — ED Notes (Signed)
[facility] staff member going back to finish report and left phone number in order for the MD to call for details of what happened earlier today.   [phone]

## 2015-06-15 NOTE — ED Notes (Signed)
Per [facility], will reassess pt this afternoon - advised spoke w/pt's group home and are willing to accept pt back.

## 2015-06-15 NOTE — ED Notes (Signed)
Spoke w/Samantha, [facility] - prior to re-TTS being performed. Pt was standing in doorway - stating he was ready to go back to group home. When advised pt re-TTS to be performed, pt stated "I'm going to kill myself if you send me back there". States they are good to him and he likes being there but does not want to go back.

## 2015-06-15 NOTE — ED Notes (Signed)
Please reference - IT Ticket [phone] - for [facility] Rounding/Shift Assessment charting missing.

## 2015-06-15 NOTE — ED Notes (Addendum)
Pt's caregiver from [place] arrived to transport pt back - [name]. Pt remained calm, cooperative . Prior to [name]'s arrival, this RN spoke again w/Samantha, [facility], SW, and Jody, SW re: pt stating he was going to kill himself if he went back. Pt also stating he liked it at this group home . Was advised to d/c pt if pt was in agreement to go w/[name]. Pt compliant and voiced no concerns or complaints - voiced agreement to go w/[name] back to group home. 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 Advised pt enjoys doing this and enjoys staying busy. Pt thanked staff for his care upon departure.

## 2015-06-15 NOTE — BH Assessment (Addendum)
Tele Assessment Note   Daniel Holmes is an 45 y.o. male who present voluntarily to [facility] due to a suicide attempt. Pt lives in [place]. He is developmentally delayed and has challenged speech. It was very difficult for counselor to understand all that pt was sharing during the assessment. Pt indicated that he "tried to kill myself". He said that he tied something around his neck and pulled it in an attempt to kill himself. When asked why he wanted to kill himself, pt indicated that "I don't care about myself". Pt also reported that he still wanted to kill himself and, if he were released today, he would try to kill himself again. When asked if he feels depressed, pt indicated that he did not feel depressed. Pt also said something about voices telling him to "go, get out of here". Pt's mood was pleasant and his affect was incongruent with what he was reporting, as evidenced by the matter-of-fact, light way he communicated throughout the assessment.   After speaking with pt, counselor called Daniel Holmes, pt's paraprofessional at [facility]. Daniel Holmes reported that they went to the [facility] parade on yesterday and pt saw a lot of people he knew from, he assumed, his previous group home. After greeting everyone and receiving greetings back, there was one person that was standoffish to pt and turned away from him. Daniel Holmes continued that, after this, he noticed pt start to withdraw into himself a little. Later in the day, pt started to say that he wanted to move, he didn't like it at the [facility] anymore and he wanted to go to [place]. Pt also got a sweatshirt out of his room and draped it around his shoulders, stating he would kill himself. Daniel Holmes reported that pt never tied the sweatshirt around his neck and was never aggressive in his threats to kill himself. Daniel Holmes added that after several hours of trying to de-escalate pt, including calling his father, he called the mobile crisis and they  suggested bringing him to [facility], since pt kept saying he was going to kill himself. Daniel Holmes also shared that pt has been with them since October and he has never had any such behaviors since being there and has always acted like he loved it there.   Daniel Holmes was made aware that pt was still endorsing SI and the decision that pt did not appear to be a danger to himself and is just reacting to an identifiable trigger (being "rejected" by former acquaintance at parade). Daniel Holmes is agreeable to the plan of pt being d/c to the [facility] after several hours of observation.   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 Psychosis     See Dr Daniel Holmes [facility] Daniel Holmes  . Asthma   . Mental developmental delay     Past Surgical History  Procedure Laterality Date  . Hernia repair      Family History: No family history on file.  Social History:  reports that he has quit smoking. His smoking use included Cigarettes. He smoked 0.00 packs per day. He does not have any smokeless tobacco history on file. He reports that he does not drink alcohol or use illicit drugs.  Additional Social History:  Alcohol / Drug Use Pain Medications: see MAR Prescriptions: see MAR Over the Counter: see MAR History of alcohol / drug use?: No history of alcohol / drug abuse  CIWA: CIWA-Ar BP: 122/79  mmHg Pulse Rate: 88 COWS:    PATIENT STRENGTHS: (choose at least two) Capable of independent living Communication skills Supportive family/friends  Allergies:  Allergies  Allergen Reactions  . Penicillins Anaphylaxis    Has patient had a PCN reaction causing immediate rash, facial/tongue/throat swelling, SOB or lightheadedness with hypotension: Yes Has patient had a PCN reaction causing severe rash involving mucus membranes or skin necrosis: No Has patient had a PCN reaction that required hospitalization No Has patient had a PCN  reaction occurring within the last 10 years: No If all of the above answers are "NO", then may proceed with Cephalosporin use.     Home Medications:  (Not in a hospital admission)  OB/GYN Status:  No LMP for male patient.  General Assessment Data Location of Assessment: [facility] ED TTS Assessment: In system Is this a Tele or Face-to-Face Assessment?: Tele Assessment Is this an Initial Assessment or a Re-assessment for this encounter?: Initial Assessment Marital status: Single Is patient pregnant?: No Pregnancy Status: No Living Arrangements: Other (Comment) ([facility]) Can pt return to current living arrangement?: Yes Admission Status: Voluntary (Simultaneous filing. User may not have seen previous data.) Is patient capable of signing voluntary admission?: No Referral Source: Self/Family/Friend Insurance type: Medicaid  Medical Screening Exam ([place]) Medical Exam completed: Yes  Crisis Care Plan Living Arrangements: Other (Comment) ([facility]) Legal Guardian: Father Name of Psychiatrist: unknown Name of Therapist: unknown  Education Status Is patient currently in school?: No  Risk to self with the past 6 months Suicidal Ideation: Yes-Currently Present Has patient been a risk to self within the past 6 months prior to admission? : Yes Suicidal Intent: Yes-Currently Present Has patient had any suicidal intent within the past 6 months prior to admission? : Yes Is patient at risk for suicide?: Yes Suicidal Plan?: Yes-Currently Present Has patient had any suicidal plan within the past 6 months prior to admission? : Yes Specify Current Suicidal Plan: hang himself with a sweater Access to Means: Yes Specify Access to Suicidal Means: pt has a sweater What has been your use of drugs/alcohol within the last 12 months?: none Previous Attempts/Gestures: Yes How many times?: 2 Triggers for Past Attempts: Unknown, Unpredictable Intentional Self Injurious Behavior: None Family Suicide  History: Unknown Recent stressful life event(s): Conflict (Comment) (possibly rejected by former group home friend) Persecutory voices/beliefs?: No Depression: No Substance abuse history and/or treatment for substance abuse?: No (Simultaneous filing. User may not have seen previous data.) Suicide prevention information given to non-admitted patients: Not applicable  Risk to Others within the past 6 months Homicidal Ideation: No Does patient have any lifetime risk of violence toward others beyond the six months prior to admission? : Unknown Thoughts of Harm to Others: No Current Homicidal Intent: No Current Homicidal Plan: No Access to Homicidal Means: No History of harm to others?: No Assessment of Violence: None Noted Does patient have access to weapons?: No Criminal Charges Pending?: No Does patient have a court date: No Is patient on probation?: No  Psychosis Hallucinations: Auditory Delusions: None noted  Mental Status Report Appearance/Hygiene: Unremarkable Eye Contact: Fair Motor Activity: Unremarkable (Simultaneous filing. User may not have seen previous data.) Speech: Incoherent Level of Consciousness: Alert Mood: Pleasant Affect: Inconsistent with thought content Anxiety Level: None Thought Processes: Unable to Assess Judgement: Unable to Assess Orientation: Unable to assess, Person Obsessive Compulsive Thoughts/Behaviors: None  Cognitive Functioning Concentration: Unable to Assess Memory: Unable to Assess IQ: Below Average Level of Function: moderate to high functioning Insight:  Poor Impulse Control: Poor Appetite: Good Sleep: No Change Vegetative Symptoms: None  ADLScreening [facility] Assessment Services) Patient's cognitive ability adequate to safely complete daily activities?: Yes Patient able to express need for assistance with ADLs?: Yes Independently performs ADLs?: Yes (appropriate for developmental age)  Prior Inpatient Therapy Prior Inpatient  Therapy: Yes Prior Therapy Dates: 2016 Prior Therapy Facilty/Provider(s): [facility] Reason for Treatment: SI  Prior Outpatient Therapy Prior Outpatient Therapy:  (UNKOWN) Does patient have an ACCT team?: Unknown Does patient have Intensive In-House Services?  : Unknown Does patient have [facility] services? : Unknown Does patient have P4CC services?: Unknown  ADL Screening (condition at time of admission) Patient's cognitive ability adequate to safely complete daily activities?: Yes Is the patient deaf or have difficulty hearing?: No Does the patient have difficulty seeing, even when wearing glasses/contacts?: No Does the patient have difficulty concentrating, remembering, or making decisions?: No Patient able to express need for assistance with ADLs?: Yes Does the patient have difficulty dressing or bathing?: No Independently performs ADLs?: Yes (appropriate for developmental age) Does the patient have difficulty walking or climbing stairs?: No Weakness of Legs: None Weakness of Arms/Hands: None  Home Assistive Devices/Equipment Home Assistive Devices/Equipment: None  Therapy Consults (therapy consults require a physician order) PT Evaluation Needed: No OT Evalulation Needed: No Abuse/Neglect Assessment (Assessment to be complete while patient is alone) Physical Abuse: Denies (unknown-pt unintelligible) Verbal Abuse: Denies (unknown-pt unintelligible) Sexual Abuse: Denies (unknown-pt unintelligible) Exploitation of patient/patient's resources: Denies (unknown-pt unintelligible) Self-Neglect: Denies (unknown-pt unintelligible) Values / Beliefs Cultural Requests During Hospitalization: None Spiritual Requests During Hospitalization: None Consults Spiritual Care Consult Needed: No Social Work Consult Needed: No Regulatory affairs officer (For Healthcare) Does patient have an advance directive?: No Would patient like information on creating an advanced directive?: No - patient declined  information    Additional Information 1:1 In Past 12 Months?: No CIRT Risk: No Elopement Risk: No Does patient have medical clearance?: Yes     Disposition:  Disposition Initial Assessment Completed for this Encounter: Yes Disposition of Patient: Other dispositions (per Elmarie Shiley, NP) Other disposition(s): To current provider (d/c to [facility], after observation until @ 1500 today & re-assess)  Rexene Edison 06/15/2015 9:09 AM

## 2015-06-15 NOTE — ED Notes (Signed)
Security called to come wand pt  

## 2015-06-17 ENCOUNTER — Ambulatory Visit (INDEPENDENT_AMBULATORY_CARE_PROVIDER_SITE_OTHER): Payer: Medicaid Other | Admitting: Student

## 2015-06-17 ENCOUNTER — Encounter: Payer: Self-pay | Admitting: Student

## 2015-06-17 VITALS — BP 124/68 | HR 89 | Temp 97.6°F | Ht 66.0 in | Wt 182.0 lb

## 2015-06-17 DIAGNOSIS — S41112D Laceration without foreign body of left upper arm, subsequent encounter: Secondary | ICD-10-CM

## 2015-06-17 DIAGNOSIS — J9801 Acute bronchospasm: Secondary | ICD-10-CM | POA: Insufficient documentation

## 2015-06-17 DIAGNOSIS — R45851 Suicidal ideations: Secondary | ICD-10-CM

## 2015-06-17 DIAGNOSIS — S41119A Laceration without foreign body of unspecified upper arm, initial encounter: Secondary | ICD-10-CM | POA: Insufficient documentation

## 2015-06-17 DIAGNOSIS — S41112A Laceration without foreign body of left upper arm, initial encounter: Secondary | ICD-10-CM | POA: Insufficient documentation

## 2015-06-17 MED ORDER — ALBUTEROL SULFATE (2.5 MG/3ML) 0.083% IN NEBU: 2.5000 mg | INHALATION_SOLUTION | Freq: Four times a day (QID) | RESPIRATORY_TRACT | Status: DC | PRN

## 2015-06-17 NOTE — Assessment & Plan Note (Signed)
Reports a history of asthma with no current treatment. Only reports a history of albuterol use. No PFTs documented in our system. Currently reports symptoms only when exposed to smoke. No nightly symptoms - Will give albuterol inhaler now and will have him follow up with PCP for consideration of PFTs and potential controller medication

## 2015-06-17 NOTE — Assessment & Plan Note (Addendum)
Resolved suicidal ideation with plan to follow with psych for further medication adjustment and counseling - crisis hotline and further mental resource contact numbers provided - will continue to follow along

## 2015-06-17 NOTE — Progress Notes (Signed)
   Subjective:    Patient ID: Daniel Holmes, male    DOB: 1970-11-15, 45 y.o.   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 at one point he threw something at the wall and - shortly after that he wrapped a sweat shirt around his neck and stated he was going to hang himself - His care giver then called his supervisor and the crisis hot line and took him to the ED - In the ED he hurt himself when using the restroom by scratching at his left forearm. This was bandaged - He was evaluated by Psychiatry in the ED and after being determined to not be a danger to himself, he was discharged to his caregiver - he does have a psychiatrist who works with him at his Group home  - Since discharge from the hospital, he denies any desire to hurt or kill himself - his left arm his still wrapped and he denies pain in that arm  History of asthma - He reports that he has been diagnosed and treated for asthma in the past with an inhaler - he requests one today because he has noted difficulty breathing specifically when around smoker - denies night night symptoms - his caregiver does smoke and when he does, the patient finds this makes it hard for him to breath  Review of Systems ROS Per HPI otherwise denies headache, fever, SOB, chest pain, N/V/D  SH: Denies current smoking although he does report that is past he did smoke    Past Medical, Surgical, Social, and Family History Reviewed & Updated  per EMR.   Objective:  BP 124/68 mmHg  Pulse 89  Temp(Src) 97.6 F (36.4 C) (Oral)  Ht 5\' 6"  (1.676 m)  Wt 182 lb (82.555 kg)  BMI 29.39 kg/m2  SpO2 97% Vitals and nursing note reviewed  General: NAD Cardiac: RRR,  Respiratory: CTAB, normal effort Extremities: left fore arm, initially wrapped in gauze, after removing dressing noted to have multiple superficial scratched on inner arm, central portion with scant bleeding Neuro: alert and oriented, no focal deficits   Assessment & Plan:    Asthma Reports a history of asthma with no current treatment. Only reports a history of albuterol use. No PFTs documented in our system. Currently reports symptoms only when exposed to smoke. No nightly symptoms - Will give albuterol inhaler now and will have him follow up with PCP for consideration of PFTs and potential controller medication  Suicidal ideation Resolved suicidal ideation with plan to follow with psych for further medication adjustment and counseling - will continue to follow along  Laceration of arm Left arm laceration most ly healed and scabbed over, but re-wrapped in clinic over bleeding area. Pt to remove in 24 hours - Will follow as needed     Alyssa A. Lincoln Brigham MD, [facility] Resident PGY-2 Pager [phone]

## 2015-06-17 NOTE — Patient Instructions (Addendum)
Make an appointment with your PCP as soon as possible to discuss your concern for asthma You were prescribed albuterol to use when you feel you are having trouble breathing Please have family and friends smoke outside of the home as it can worsen asthma symtoms Please call the crisis center if you have any concerns you may harm yourself.  If you have questions or concerns, call the office at [place]   [phone]  Provides information on mental health, intellectual/developmental disabilities & substance abuse services in [place].   COUNSELING AGENCIES (Accepts Medicaid)  [facility] [address]        [id number] *[facility] [address]      [phone]  [facility] the [place] (I) [facility] [address]"   [phone] (I) [place] [address]  [phone] [facility] [place] (I) *[facility] [id number] [phone]   [place] [id number] [id number] [facility] for [place]         [phone] (I) *[facility] [address]                 [phone] (I) [place]              [phone] (I) *[facility] [place] [address]    [phone] (I) [facility] [address] [facility] [place]                    [phone] (I)* *[facility] [address]   [place] hour availability [place]:     [phone] [address] [place], [place] [address]   Family [facility] of the [facility] [phone] (Domestic Violence, Rape & Victim Assistance )  [place]   [phone] or [phone] [facility] and Crisis Services)  [place]                          Environmental health practitioner Crisis Unit (24/7)             [phone]   [place] National Suicide Hotline    [phone] [place] [place])  [place]   (Only from 8am-4pm)   [phone]

## 2015-06-17 NOTE — Assessment & Plan Note (Addendum)
Left arm laceration most ly healed and scabbed over, but re-wrapped in clinic over bleeding area. Pt to remove in 24 hours - Will follow as needed

## 2015-06-18 ENCOUNTER — Telehealth: Payer: Self-pay | Admitting: Family Medicine

## 2015-06-18 NOTE — Telephone Encounter (Signed)
Patient's Caregiver asks PCP to complete Forms about appointments from January 11 and 19. Please, follow up with Mr. Ruthann Cancer.

## 2015-06-18 NOTE — Telephone Encounter (Signed)
Clinic portion completed and placed in provider's box.  1 form is for appt on 06/08/16 with Dr. Erin Hearing and the other is for his most recent with Dr. Lincoln Brigham.  Kajsa Butrum,CMA

## 2015-06-30 NOTE — Telephone Encounter (Signed)
Caregiver is calling to check the status of paperwork that was drop off and supposed to be faxed back. Please let him know the status of these forms. jw

## 2015-06-30 NOTE — Telephone Encounter (Signed)
Will forward to Dr. Lincoln Brigham to check on the status of these forms. Ilia Engelbert,CMA

## 2015-07-05 ENCOUNTER — Telehealth: Payer: Self-pay | Admitting: Family Medicine

## 2015-07-05 NOTE — Telephone Encounter (Signed)
Health coordinator called and has some questions about the patient nebulizer. jw

## 2015-07-06 MED ORDER — ALBUTEROL SULFATE HFA 108 (90 BASE) MCG/ACT IN AERS: 2.0000 | INHALATION_SPRAY | Freq: Four times a day (QID) | RESPIRATORY_TRACT | Status: DC | PRN

## 2015-07-06 NOTE — Telephone Encounter (Signed)
Nebulizer was entered in error. Albuterol MDI was meant to be started. This has been corrected

## 2015-07-06 NOTE — Telephone Encounter (Signed)
Spoke with caregiver and pharmacy was missing information from our office in order to complete medication order.  Spoke with Janett Billow at [facility] and patient was never written for a DME order for the nebulizer machine.  Will forward to Dr. Lincoln Brigham who wrote script for medication to write this order and we can fax it to pharmacy.  If we get it there early enough then they can deliver it today. Daniel Holmes,CMA

## 2015-07-06 NOTE — Telephone Encounter (Signed)
Spoke with caregiver and pharmacy and informed them of this change. Natane Heward,CMA

## 2015-07-07 ENCOUNTER — Ambulatory Visit (INDEPENDENT_AMBULATORY_CARE_PROVIDER_SITE_OTHER): Payer: Medicaid Other | Admitting: Family Medicine

## 2015-07-07 ENCOUNTER — Encounter: Payer: Self-pay | Admitting: Family Medicine

## 2015-07-07 VITALS — BP 114/73 | HR 78 | Temp 98.2°F | Wt 176.8 lb

## 2015-07-07 DIAGNOSIS — R45851 Suicidal ideations: Secondary | ICD-10-CM

## 2015-07-07 DIAGNOSIS — J9801 Acute bronchospasm: Secondary | ICD-10-CM

## 2015-07-07 NOTE — Assessment & Plan Note (Signed)
Improved.  Continue current meds

## 2015-07-07 NOTE — Assessment & Plan Note (Signed)
I am not sure this is asthma I think more consistent with bronchospasm after a URI.  Patient is very suggestive and likes treatments (knee braces, various as needed medications)  I do not detect any bronchospasm today on exam and he can ambulate well without symptoms.  Will continue MDI as needed for a few weeks but will try not to prescribe long term

## 2015-07-07 NOTE — Progress Notes (Signed)
   Subjective:    Patient ID: Daniel Holmes, male    DOB: 08-31-1970, 45 y.o.   MRN: [id number]  HPI  Bronchospasm - using albuterol mdi intermittently thinks it helps.  Feels has some trouble breathing at night.  No fever or sputum or leg swelling.    Suicide - does not feel depressed on any suicidal ideation now.  Taking all medications as prescribed according to caretaker  Chief Complaint noted Review of Symptoms - see HPI PMH - Smoking status noted.   Vital Signs reviewed    Review of Systems     Objective:   Physical Exam  Alert cooperative nad Able to walk around clinic with POx staying in 98% and no resp distress Lung - difficult to get him to coordinate his breathing to exhale forcefully.  No wheezes or dullness or rales Extremities:  No cyanosis, edema, or deformity noted with good range of motion of all major joints.   Wearing R knee sleeve  Psych:  t. Alert, communicative  and cooperative . No apparent delusions, illusions, hallucinations       Assessment & Plan:

## 2015-07-07 NOTE — Patient Instructions (Signed)
Good to see you today!  Thanks for coming in.  Your lungs sound good I think you have bronchitis but I doubt you have asthma that will stay with you   Use the inhaler only for the next 1-2 weeks no more than 3-4 x a day  Keep using the sleeve  Your blood tests were all ok in the ER last month  Come back in 6 months for a checkup

## 2015-07-08 ENCOUNTER — Telehealth: Payer: Self-pay | Admitting: Family Medicine

## 2015-07-08 NOTE — Telephone Encounter (Signed)
Needs FL2.  Would like to pick up today

## 2015-07-08 NOTE — Telephone Encounter (Signed)
Form placed in provider's box for completion. Belal Scallon,CMA  

## 2015-07-09 NOTE — Telephone Encounter (Signed)
Please let them know  I agreed to fill out a form about the patients recent visit not to do a FL2   I need to see the patient with his caretaker to fill this out accurately  The can make a visit for a physical exam and I will fill it out then  Thanks  [place]

## 2015-07-09 NOTE — Telephone Encounter (Signed)
Clld [name]/Caregiver  - advsd of PCP's notation. Scheduled physical for 07/14/15 @ 8:30 am. Reminded [name] to  make sure he brings FL2 form to complete.

## 2015-07-14 ENCOUNTER — Encounter: Payer: Self-pay | Admitting: Family Medicine

## 2015-07-14 ENCOUNTER — Ambulatory Visit (INDEPENDENT_AMBULATORY_CARE_PROVIDER_SITE_OTHER): Payer: Medicaid Other | Admitting: Family Medicine

## 2015-07-14 VITALS — BP 114/76 | HR 86 | Temp 97.9°F | Ht 66.0 in | Wt 181.0 lb

## 2015-07-14 DIAGNOSIS — Z0000 Encounter for general adult medical examination without abnormal findings: Secondary | ICD-10-CM | POA: Diagnosis not present

## 2015-07-14 DIAGNOSIS — M7989 Other specified soft tissue disorders: Secondary | ICD-10-CM | POA: Diagnosis not present

## 2015-07-14 NOTE — Progress Notes (Signed)
   Subjective:    Patient ID: Daniel Holmes, male    DOB: 23-Apr-1971, 45 y.o.   MRN: [id number]  HPI Here for physican and FL2 completion  Left leg swelling - noted on my exam.  He did not really notice it.  No shortness of breath or chest pain.  Wears a tight knee sleeve all the time including at night.  No sores or pain  Patient reports no  vision/ hearing changes,anorexia, weight change, fever ,adenopathy, persistant / recurrent hoarseness, swallowing issues, chest pain, edema,persistant / recurrent cough, hemoptysis, dyspnea(rest, exertional, paroxysmal nocturnal), gastrointestinal  bleeding (melena, rectal bleeding), abdominal pain, excessive heart burn, GU symptoms(dysuria, hematuria, pyuria, voiding/incontinence  Issues) syncope, focal weakness, severe memory loss, concerning skin lesions, depression, anxiety, abnormal bruising/bleeding, major joint swelling.    Chief Complaint noted Review of Symptoms - see HPI PMH - Smoking status noted.   Vital Signs reviewed   Review of Systems     Objective:   Physical Exam Alert nad Heart - Regular rate and rhythm.  No murmurs, gallops or rubs.    Lungs:  Normal respiratory effort, chest expands symmetrically. Lungs are clear to auscultation, no crackles or wheezes. Abdomen: soft and non-tender without masses, organomegaly or hernias noted.  No guarding or rebound Extremities:  No cyanosis,  or deformity noted with good range of motion of all major joints.  Left leg just above ankle has 2+ pitting edema.  No calf tenderness or skin lesions.  Good range of motion of knee and ankle.  Has on a black neoprene sleeve that is folded over and appears very tight Skin:  Intact without suspicious lesions or rashes Neck:  No deformities, thyromegaly, masses, or tenderness noted.   Supple with full range of motion without pain.         Assessment & Plan:   Normal exam except for leg swelling

## 2015-07-14 NOTE — Patient Instructions (Signed)
Good to see you today!  Thanks for coming in.  You have swelling of your left leg.  I think this is due to your knee sleeve.  DO NOT WEAR THE SLEEVE at night only during the day when you are walking on it  If the leg swelling is not much better within one week you need to come back for an exam and testing

## 2015-07-14 NOTE — Assessment & Plan Note (Signed)
Given asymptomatic nature and the tight knee sleeve I think this is mainly compressive.  Will cut back on wearing the sleeve (he is very attached to it and believe is helps with his mild arthritis) especially at night and monitor for resolution.  I explained this to his care taker

## 2015-08-04 ENCOUNTER — Encounter (HOSPITAL_COMMUNITY): Payer: Self-pay | Admitting: Emergency Medicine

## 2015-08-04 DIAGNOSIS — Z79899 Other long term (current) drug therapy: Secondary | ICD-10-CM | POA: Insufficient documentation

## 2015-08-04 DIAGNOSIS — J069 Acute upper respiratory infection, unspecified: Secondary | ICD-10-CM | POA: Insufficient documentation

## 2015-08-04 DIAGNOSIS — Z880 Allergy status to penicillin: Secondary | ICD-10-CM | POA: Diagnosis not present

## 2015-08-04 DIAGNOSIS — R05 Cough: Secondary | ICD-10-CM | POA: Diagnosis present

## 2015-08-04 DIAGNOSIS — R0789 Other chest pain: Secondary | ICD-10-CM | POA: Diagnosis not present

## 2015-08-04 DIAGNOSIS — Z87891 Personal history of nicotine dependence: Secondary | ICD-10-CM | POA: Insufficient documentation

## 2015-08-04 DIAGNOSIS — Z8659 Personal history of other mental and behavioral disorders: Secondary | ICD-10-CM | POA: Diagnosis not present

## 2015-08-04 DIAGNOSIS — J45909 Unspecified asthma, uncomplicated: Secondary | ICD-10-CM | POA: Insufficient documentation

## 2015-08-04 NOTE — ED Notes (Signed)
Pt. reports persistent productive cough / chest congestion with nasal congestion onset last night , denies fever or chills.

## 2015-08-05 ENCOUNTER — Emergency Department (HOSPITAL_COMMUNITY): Payer: Medicaid Other

## 2015-08-05 ENCOUNTER — Emergency Department (HOSPITAL_COMMUNITY)
Admission: EM | Admit: 2015-08-05 | Discharge: 2015-08-05 | Disposition: A | Discharge status: Home / Self Care | Payer: Medicaid Other | Attending: Emergency Medicine | Admitting: Emergency Medicine

## 2015-08-05 DIAGNOSIS — B9789 Other viral agents as the cause of diseases classified elsewhere: Secondary | ICD-10-CM

## 2015-08-05 DIAGNOSIS — J069 Acute upper respiratory infection, unspecified: Secondary | ICD-10-CM

## 2015-08-05 MED ORDER — FLUTICASONE PROPIONATE 50 MCG/ACT NA SUSP
2.0000 | Freq: Every day | NASAL | Status: DC
  Administered 2015-08-05: 2 via NASAL
  Filled 2015-08-05: qty 16

## 2015-08-05 MED ORDER — IPRATROPIUM BROMIDE 0.02 % IN SOLN
0.5000 mg | Freq: Once | RESPIRATORY_TRACT | Status: AC
  Administered 2015-08-05: 0.5 mg via RESPIRATORY_TRACT
  Filled 2015-08-05: qty 2.5

## 2015-08-05 MED ORDER — PREDNISONE 20 MG PO TABS: 40.0000 mg | ORAL_TABLET | Freq: Every day | ORAL | Status: DC

## 2015-08-05 MED ORDER — ALBUTEROL SULFATE HFA 108 (90 BASE) MCG/ACT IN AERS
2.0000 | INHALATION_SPRAY | Freq: Once | RESPIRATORY_TRACT | Status: AC
  Administered 2015-08-05: 2 via RESPIRATORY_TRACT
  Filled 2015-08-05: qty 6.7

## 2015-08-05 MED ORDER — ALBUTEROL SULFATE (2.5 MG/3ML) 0.083% IN NEBU
5.0000 mg | INHALATION_SOLUTION | Freq: Once | RESPIRATORY_TRACT | Status: AC
  Administered 2015-08-05: 5 mg via RESPIRATORY_TRACT
  Filled 2015-08-05: qty 6

## 2015-08-05 MED ORDER — PREDNISONE 20 MG PO TABS
60.0000 mg | ORAL_TABLET | Freq: Once | ORAL | Status: AC
  Administered 2015-08-05: 60 mg via ORAL
  Filled 2015-08-05: qty 3

## 2015-08-05 MED ORDER — BENZONATATE 100 MG PO CAPS: 100.0000 mg | ORAL_CAPSULE | Freq: Three times a day (TID) | ORAL | Status: DC

## 2015-08-05 MED ORDER — HYDROCODONE-HOMATROPINE 5-1.5 MG/5ML PO SYRP
5.0000 mL | ORAL_SOLUTION | Freq: Once | ORAL | Status: AC
  Administered 2015-08-05: 5 mL via ORAL
  Filled 2015-08-05: qty 5

## 2015-08-05 MED ORDER — LORAZEPAM 0.5 MG PO TABS
0.5000 mg | ORAL_TABLET | Freq: Once | ORAL | Status: AC
  Administered 2015-08-05: 0.5 mg via ORAL
  Filled 2015-08-05: qty 1

## 2015-08-05 MED ORDER — LORAZEPAM 0.5 MG PO TABS: 1.0000 mg | ORAL_TABLET | Freq: Once | ORAL | Status: DC

## 2015-08-05 NOTE — Discharge Instructions (Signed)

## 2015-08-05 NOTE — ED Provider Notes (Signed)
CSN: [id number]     Arrival date & time 08/04/15  2319 History   First MD Initiated Contact with Patient 08/05/15 0448     Chief Complaint  Patient presents with  . Cough     (Consider location/radiation/quality/duration/timing/severity/associated sxs/prior Treatment) Patient is a 45 y.o. male presenting with cough. The history is provided by the patient. No language interpreter was used.  Cough Cough characteristics:  Dry and harsh Severity:  Moderate Onset quality:  Gradual Duration:  2 days Timing:  Intermittent Progression:  Waxing and waning Chronicity:  New Smoker: no (former)   Context: upper respiratory infection   Relieved by:  Nothing Ineffective treatments:  None tried Associated symptoms: sinus congestion   Associated symptoms: no fever and no wheezing     Past Medical History  Diagnosis Date  . Psychosis     See Dr Laverta [place] [name]  . Asthma   . Mental developmental delay    Past Surgical History  Procedure Laterality Date  . Hernia repair     No family history on file. Social History  Substance Use Topics  . Smoking status: Former Smoker -- 0.00 packs/day    Types: Cigarettes  . Smokeless tobacco: None  . Alcohol Use: No    Review of Systems  Constitutional: Negative for fever.  HENT: Positive for congestion.   Respiratory: Positive for cough and chest tightness. Negative for wheezing.   Gastrointestinal: Negative for nausea and vomiting.  All other systems reviewed and are negative.   Allergies  Penicillins  Home Medications   Prior to Admission medications   Medication Sig Start Date End Date Taking? Authorizing Provider  acetaminophen (TYLENOL) 500 MG tablet Take 2 tablets (1,000 mg total) by mouth 2 (two) times daily as needed (knee pain). 06/09/15   Lind Covert, MD  benzonatate (TESSALON) 100 MG capsule Take 1 capsule (100 mg total) by mouth every 8 (eight) hours. 08/05/15   Antonietta Breach, PA-C  cloNIDine  (CATAPRES) 0.1 MG tablet Take 1 tablet (0.1 mg total) by mouth 2 (two) times daily. 11/10/14   Clovis Fredrickson, MD  guaifenesin (ROBITUSSIN) 100 MG/5ML syrup Take 200 mg by mouth 3 (three) times daily as needed for cough.    Historical Provider, MD  lamoTRIgine (LAMICTAL) 100 MG tablet Take 100 mg by mouth daily.    Historical Provider, MD  lamoTRIgine (LAMICTAL) 25 MG tablet Take 100 mg by mouth at bedtime.    Historical Provider, MD  levothyroxine (SYNTHROID, LEVOTHROID) 75 MCG tablet TAKE (1) TABLET BY MOUTH ONCE DAILY BEFORE BREAKFAST. 03/09/15   Lind Covert, MD  lithium carbonate (ESKALITH) 450 MG CR tablet Take 1 tablet (450 mg total) by mouth every 12 (twelve) hours. 11/10/14   Clovis Fredrickson, MD  Multiple Vitamins-Minerals (MULTIVITAMIN ADULTS 50+) TABS Take 1 tablet by mouth daily. 11/10/14   Clovis Fredrickson, MD  naproxen (NAPROSYN) 250 MG tablet Take 1 tablet (250 mg total) by mouth 2 (two) times daily as needed for mild pain or moderate pain (TAKE WITH MEALS.). 05/31/15   Mercedes Camprubi-Soms, PA-C  OLANZapine (ZYPREXA) 20 MG tablet Take 1.5 tablets (30 mg total) by mouth at bedtime. Take 1 and 1/2 tablets by mouth at bedtime. Patient taking differently: Take 30 mg by mouth daily. Take 1 and 1/2 tablets by mouth at bedtime. 11/10/14   Clovis Fredrickson, MD  omeprazole (PRILOSEC) 10 MG capsule Take 1 capsule (10 mg total) by mouth daily. 06/09/15   Jeb Levering  Chambliss, MD  predniSONE (DELTASONE) 20 MG tablet Take 2 tablets (40 mg total) by mouth daily. 08/05/15   Antonietta Breach, PA-C  traZODone (DESYREL) 150 MG tablet Take 1 tablet (150 mg total) by mouth at bedtime. 11/10/14   Clovis Fredrickson, MD  trolamine salicylate (ASPERCREME/ALOE) 10 % cream Apply 1 application topically 4 (four) times daily as needed for muscle pain. 12/02/14   Lind Covert, MD  Vitamin D, Ergocalciferol, (DRISDOL) 50000 UNITS CAPS capsule Take 1 capsule (50,000 Units total) by mouth every  14 (fourteen) days. 11/10/14   Jolanta B Pucilowska, MD   BP 130/96 mmHg  Pulse 75  Temp(Src) 98.3 F (36.8 C) (Oral)  Resp 22  SpO2 100%   Physical Exam  Constitutional: He is oriented to person, place, and time. He appears well-developed and well-nourished. No distress.  Nontoxic/nonseptic appearing  HENT:  Head: Normocephalic and atraumatic.  Eyes: Conjunctivae and EOM are normal. No scleral icterus.  Neck: Normal range of motion.  Cardiovascular: Normal rate, regular rhythm and intact distal pulses.   Pulmonary/Chest: Effort normal. No respiratory distress. He has no wheezes. He has no rales.  Chest expansion symmetric. Dry, intermittent cough appreciated at bedside.  Musculoskeletal: Normal range of motion.  Neurological: He is alert and oriented to person, place, and time. He exhibits normal muscle tone. Coordination normal.  Skin: Skin is warm and dry. No rash noted. He is not diaphoretic. No erythema. No pallor.  Psychiatric: He has a normal mood and affect. His behavior is normal.  Nursing note and vitals reviewed.   ED Course  Procedures (including critical care time) Labs Review Labs Reviewed - No data to display  Imaging Review Dg Chest 2 View  08/05/2015  CLINICAL DATA:  Persistent productive cough and chest congestion. Shortness of breath tonight. EXAM: CHEST  2 VIEW COMPARISON:  05/30/2015 FINDINGS: The cardiomediastinal contours are normal. Bibasilar atelectasis or scarring, with mild improvement compared to prior. Pulmonary vasculature is normal. No consolidation, pleural effusion, or pneumothorax. No acute osseous abnormalities are seen. IMPRESSION: Bibasilar atelectasis or scarring.  No acute process. Electronically Signed   By: Jeb Levering M.D.   On: 08/05/2015 00:08   I have personally reviewed and evaluated these images and lab results as part of my medical decision-making.   EKG Interpretation None      MDM   Final diagnoses:  Viral URI with  cough    Patient presenting for cough. He is afebrile. No hypoxia. CXR negative for acute infiltrate. Patients symptoms are consistent with URI, likely viral etiology. Discussed that antibiotics are not indicated for viral infections. Symptoms treated in ED with Hycodan, DuoNeb, and prednisone with mild improvement. Flonase also given. Will continue course of same as outpatient. Patient verbalizes understanding and is agreeable with plan. Discharged in satisfactory condition.   Filed Vitals:   08/04/15 2327 08/05/15 0455  BP: 123/78 130/96  Pulse: 91 75  Temp: 98 F (36.7 C) 98.3 F (36.8 C)  TempSrc: Oral Oral  Resp: 22 22  SpO2: 94% 100%     Antonietta Breach, PA-C 08/05/15 [place], DO 08/05/15 [id number]

## 2015-08-05 NOTE — ED Notes (Signed)
[facility] Provider, [name]: [phone]

## 2015-08-12 ENCOUNTER — Encounter (HOSPITAL_COMMUNITY): Payer: Self-pay | Admitting: Emergency Medicine

## 2015-08-12 ENCOUNTER — Emergency Department (HOSPITAL_COMMUNITY)
Admission: EM | Admit: 2015-08-12 | Discharge: 2015-08-13 | Disposition: A | Discharge status: Home / Self Care | Payer: Medicaid Other | Attending: Emergency Medicine | Admitting: Emergency Medicine

## 2015-08-12 DIAGNOSIS — Z87891 Personal history of nicotine dependence: Secondary | ICD-10-CM | POA: Insufficient documentation

## 2015-08-12 DIAGNOSIS — M79605 Pain in left leg: Secondary | ICD-10-CM | POA: Diagnosis not present

## 2015-08-12 DIAGNOSIS — M546 Pain in thoracic spine: Secondary | ICD-10-CM | POA: Insufficient documentation

## 2015-08-12 DIAGNOSIS — Z79899 Other long term (current) drug therapy: Secondary | ICD-10-CM | POA: Diagnosis not present

## 2015-08-12 DIAGNOSIS — M545 Low back pain: Secondary | ICD-10-CM | POA: Insufficient documentation

## 2015-08-12 DIAGNOSIS — M79604 Pain in right leg: Secondary | ICD-10-CM | POA: Diagnosis not present

## 2015-08-12 DIAGNOSIS — Z880 Allergy status to penicillin: Secondary | ICD-10-CM | POA: Diagnosis not present

## 2015-08-12 DIAGNOSIS — J45909 Unspecified asthma, uncomplicated: Secondary | ICD-10-CM | POA: Diagnosis not present

## 2015-08-12 DIAGNOSIS — M791 Myalgia, unspecified site: Secondary | ICD-10-CM

## 2015-08-12 DIAGNOSIS — R109 Unspecified abdominal pain: Secondary | ICD-10-CM | POA: Insufficient documentation

## 2015-08-12 DIAGNOSIS — Z7952 Long term (current) use of systemic steroids: Secondary | ICD-10-CM | POA: Insufficient documentation

## 2015-08-12 DIAGNOSIS — F319 Bipolar disorder, unspecified: Secondary | ICD-10-CM | POA: Insufficient documentation

## 2015-08-12 HISTORY — DX: Bipolar disorder, unspecified: F31.9

## 2015-08-12 NOTE — ED Notes (Signed)
Pt from home via EMS. Pt stated his back pain began today when he was trying to get into a car. Pt stated he is unable to walk. Pt also told EMS his inhaler is out. Pt appears to be in no distress and vital signs are stable at this time

## 2015-08-12 NOTE — ED Provider Notes (Signed)
CSN: [id number]     Arrival date & time 08/12/15  2316 History  By signing my name below, I, Altamease Oiler, attest that this documentation has been prepared under the direction and in the presence of Varney Biles, MD. Electronically Signed: Altamease Oiler, ED Scribe. 08/13/2015. 3:19 AM   Chief Complaint  Patient presents with  . Back Pain   The history is provided by the patient. No language interpreter was used.   Brought in by EMS from home, Daniel Holmes is a 45 y.o. male who presents to the Emergency Department complaining of constant, sharp, left lower back pain with onset yesterday. The pain is exacerbated by walking and position changes. This pain is similar to pain that he has had in the past with arthritis. He has used naproxen in the past for arthritis pain but took none today. Pt denies nausea, vomiting, fever, chills, dysuria, hematuria, incontinence of bowel or bladder, numbness, and tingling. No history of DM.      Past Medical History  Diagnosis Date  . Psychosis     See Dr Laverta [place] [name]  . Asthma   . Mental developmental delay   . Bipolar 1 disorder [facility])    Past Surgical History  Procedure Laterality Date  . Hernia repair     No family history on file. Social History  Substance Use Topics  . Smoking status: Former Smoker -- 0.00 packs/day    Types: Cigarettes  . Smokeless tobacco: None  . Alcohol Use: No    Review of Systems  10 Systems reviewed and all are negative for acute change except as noted in the HPI.  Allergies  Penicillins  Home Medications   Prior to Admission medications   Medication Sig Start Date End Date Taking? Authorizing Provider  acetaminophen (TYLENOL) 500 MG tablet Take 2 tablets (1,000 mg total) by mouth 2 (two) times daily as needed (knee pain). 06/09/15   Lind Covert, MD  benzonatate (TESSALON) 100 MG capsule Take 1 capsule (100 mg total) by mouth every 8 (eight) hours. 08/05/15   Antonietta Breach, PA-C  cloNIDine (CATAPRES) 0.1 MG tablet Take 1 tablet (0.1 mg total) by mouth 2 (two) times daily. 11/10/14   Clovis Fredrickson, MD  guaifenesin (ROBITUSSIN) 100 MG/5ML syrup Take 200 mg by mouth 3 (three) times daily as needed for cough.    Historical Provider, MD  lamoTRIgine (LAMICTAL) 100 MG tablet Take 100 mg by mouth daily.    Historical Provider, MD  lamoTRIgine (LAMICTAL) 25 MG tablet Take 100 mg by mouth at bedtime.    Historical Provider, MD  levothyroxine (SYNTHROID, LEVOTHROID) 75 MCG tablet TAKE (1) TABLET BY MOUTH ONCE DAILY BEFORE BREAKFAST. 03/09/15   Lind Covert, MD  lidocaine (XYLOCAINE) 5 % ointment Apply 1 application topically as needed. 08/13/15   Varney Biles, MD  lithium carbonate (ESKALITH) 450 MG CR tablet Take 1 tablet (450 mg total) by mouth every 12 (twelve) hours. 11/10/14   Clovis Fredrickson, MD  Multiple Vitamins-Minerals (MULTIVITAMIN ADULTS 50+) TABS Take 1 tablet by mouth daily. 11/10/14   Clovis Fredrickson, MD  naproxen (NAPROSYN) 500 MG tablet Take 1 tablet (500 mg total) by mouth 2 (two) times daily with a meal. 08/13/15   [name], MD  OLANZapine (ZYPREXA) 20 MG tablet Take 1.5 tablets (30 mg total) by mouth at bedtime. Take 1 and 1/2 tablets by mouth at bedtime. Patient taking differently: Take 30 mg by mouth daily. Take 1  and 1/2 tablets by mouth at bedtime. 11/10/14   Clovis Fredrickson, MD  omeprazole (PRILOSEC) 10 MG capsule TAKE 1 CAPSULE BY MOUTH ONCE A DAY. 08/13/15   Lind Covert, MD  predniSONE (DELTASONE) 20 MG tablet Take 2 tablets (40 mg total) by mouth daily. 08/05/15   Antonietta Breach, PA-C  traZODone (DESYREL) 150 MG tablet Take 1 tablet (150 mg total) by mouth at bedtime. 11/10/14   Clovis Fredrickson, MD  trolamine salicylate (ASPERCREME/ALOE) 10 % cream Apply 1 application topically 4 (four) times daily as needed for muscle pain. 12/02/14   Lind Covert, MD  Vitamin D, Ergocalciferol, (DRISDOL) 50000 UNITS  CAPS capsule Take 1 capsule (50,000 Units total) by mouth every 14 (fourteen) days. 11/10/14   Jolanta B Pucilowska, MD   BP 118/72 mmHg  Pulse 82  Temp(Src) 99.4 F (37.4 C) (Oral)  Resp 16  SpO2 96% Physical Exam  Constitutional: He is oriented to person, place, and time. He appears well-developed and well-nourished.  HENT:  Head: Normocephalic and atraumatic.  Eyes: EOM are normal.  Neck: Normal range of motion.  Cardiovascular: Normal rate, regular rhythm, normal heart sounds and intact distal pulses.   Pulmonary/Chest: Effort normal and breath sounds normal. No respiratory distress.  Abdominal: Soft. He exhibits no distension. There is no tenderness.  Musculoskeletal: Normal range of motion. He exhibits tenderness.  Pt has no midline spinal step offs or TTP. Pt has left paraspinal and left flank region TTP. No hip TTP.  Tenderness with active and passive bilateral leg raise. Pelvis is stable.   Neurological: He is alert and oriented to person, place, and time.  Reflex Scores:      Patellar reflexes are 2+ on the right side and 2+ on the left side. 4+/5 lower extremity strength. Pt unable to ambulate due to tenderness.   Skin: Skin is warm and dry. No rash noted.  Psychiatric: He has a normal mood and affect. Judgment normal.  Nursing note and vitals reviewed.   ED Course  Procedures (including critical care time) DIAGNOSTIC STUDIES: Oxygen Saturation is 96% on RA, normal by my interpretation.    COORDINATION OF CARE: 12:36 AM Discussed treatment plan which includes naproxen and lab work with pt at bedside and pt agreed to plan. Pt repeatedly requesting a cream for his back.   Labs Review Labs Reviewed  CBC WITH DIFFERENTIAL/PLATELET - Abnormal; Notable for the following:    RBC 3.97 (*)    Hemoglobin 12.3 (*)    HCT 37.0 (*)    All other components within normal limits  BASIC METABOLIC PANEL - Abnormal; Notable for the following:    Glucose, Bld 108 (*)    All  other components within normal limits  LITHIUM LEVEL - Abnormal; Notable for the following:    Lithium Lvl 0.48 (*)    All other components within normal limits  URINALYSIS, ROUTINE W REFLEX MICROSCOPIC (NOT AT [facility])  SEDIMENTATION RATE    Imaging Review Dg Lumbar Spine Complete  08/13/2015  CLINICAL DATA:  Back pain after twisting injury getting into car 08/12/2015. Initial encounter. EXAM: LUMBAR SPINE - COMPLETE 4+ VIEW COMPARISON:  None. FINDINGS: There is no evidence of lumbar spine fracture. Alignment is normal. Borderline L4-5 disc narrowing. IMPRESSION: Negative. Electronically Signed   By: Monte Fantasia M.D.   On: 08/13/2015 02:22    I personally reviewed and evaluated these images and lab results as a part of my medical decision-making.    EKG Interpretation  None      6:16: Pt is feeling better. He has ambulated. States that the loiocaine ointment helped. Labs reassuring. Will d/c.  MDM   Final diagnoses:  Low back pain without sciatica, unspecified back pain laterality  Muscular pain   I personally performed the services described in this documentation, which was scribed in my presence. The recorded information has been reviewed and is accurate.  Pt comes in with back pain as the cc. Pt unable to walk due to pain. No associated numbness, weakness, urinary incontinence, urinary retention, bowel incontinence, saddle anesthesia. Pain is reproducible with palpation and position and is paraspinal. Will give nsaid and lidocaine patch.     Varney Biles, MD 08/14/15 [phone]

## 2015-08-12 NOTE — ED Notes (Signed)
Bed: [id number] Expected date:  Expected time:  Means of arrival:  Comments: EMS back pain states unable to walk/back board

## 2015-08-13 ENCOUNTER — Other Ambulatory Visit: Payer: Self-pay | Admitting: Family Medicine

## 2015-08-13 ENCOUNTER — Emergency Department (HOSPITAL_COMMUNITY): Payer: Medicaid Other

## 2015-08-13 LAB — BASIC METABOLIC PANEL
ANION GAP: 9 (ref 5–15)
BUN: 13 mg/dL (ref 6–20)
CALCIUM: 9.4 mg/dL (ref 8.9–10.3)
CO2: 24 mmol/L (ref 22–32)
Chloride: 111 mmol/L (ref 101–111)
Creatinine, Ser: 0.95 mg/dL (ref 0.61–1.24)
Glucose, Bld: 108 mg/dL — ABNORMAL HIGH (ref 65–99)
POTASSIUM: 4.2 mmol/L (ref 3.5–5.1)
Sodium: 144 mmol/L (ref 135–145)

## 2015-08-13 LAB — URINALYSIS, ROUTINE W REFLEX MICROSCOPIC
BILIRUBIN URINE: NEGATIVE
Glucose, UA: NEGATIVE mg/dL
Hgb urine dipstick: NEGATIVE
KETONES UR: NEGATIVE mg/dL
LEUKOCYTES UA: NEGATIVE
NITRITE: NEGATIVE
Protein, ur: NEGATIVE mg/dL
Specific Gravity, Urine: 1.008 (ref 1.005–1.030)
pH: 7.5 (ref 5.0–8.0)

## 2015-08-13 LAB — CBC WITH DIFFERENTIAL/PLATELET
BASOS ABS: 0 10*3/uL (ref 0.0–0.1)
BASOS PCT: 0 %
Eosinophils Absolute: 0.1 10*3/uL (ref 0.0–0.7)
Eosinophils Relative: 1 %
HEMATOCRIT: 37 % — AB (ref 39.0–52.0)
Hemoglobin: 12.3 g/dL — ABNORMAL LOW (ref 13.0–17.0)
LYMPHS PCT: 36 %
Lymphs Abs: 3.4 10*3/uL (ref 0.7–4.0)
MCH: 31 pg (ref 26.0–34.0)
MCHC: 33.2 g/dL (ref 30.0–36.0)
MCV: 93.2 fL (ref 78.0–100.0)
MONO ABS: 0.5 10*3/uL (ref 0.1–1.0)
Monocytes Relative: 5 %
NEUTROS ABS: 5.4 10*3/uL (ref 1.7–7.7)
NEUTROS PCT: 58 %
Platelets: 234 10*3/uL (ref 150–400)
RBC: 3.97 MIL/uL — AB (ref 4.22–5.81)
RDW: 12.6 % (ref 11.5–15.5)
WBC: 9.4 10*3/uL (ref 4.0–10.5)

## 2015-08-13 LAB — SEDIMENTATION RATE: SED RATE: 10 mm/h (ref 0–16)

## 2015-08-13 LAB — LITHIUM LEVEL: Lithium Lvl: 0.48 mmol/L — ABNORMAL LOW (ref 0.60–1.20)

## 2015-08-13 MED ORDER — LIDOCAINE HCL 2 % EX GEL
1.0000 "application " | Freq: Once | CUTANEOUS | Status: AC
  Administered 2015-08-13: 1 via TOPICAL
  Filled 2015-08-13: qty 11

## 2015-08-13 MED ORDER — NAPROXEN 500 MG PO TABS
500.0000 mg | ORAL_TABLET | Freq: Once | ORAL | Status: AC
  Administered 2015-08-13: 500 mg via ORAL
  Filled 2015-08-13: qty 1

## 2015-08-13 MED ORDER — NAPROXEN 500 MG PO TABS: 500.0000 mg | ORAL_TABLET | Freq: Two times a day (BID) | ORAL | Status: DC

## 2015-08-13 MED ORDER — LIDOCAINE 5 % EX OINT: 1.0000 "application " | TOPICAL_OINTMENT | CUTANEOUS | Status: DC | PRN

## 2015-08-13 NOTE — ED Notes (Addendum)
Pt. Ambulated around in the room without difficulty. Pt. Gait steady on his feet and without assistance.

## 2015-08-13 NOTE — ED Notes (Addendum)
Pt. Ambulated to the bathroom and back to his room without difficulty and assistance.

## 2015-08-13 NOTE — ED Notes (Signed)
Pt ambulated ok in hallway

## 2015-08-13 NOTE — Discharge Instructions (Signed)
Back Pain, Adult °Back pain is very common in adults. The cause of back pain is rarely dangerous and the pain often gets better over time. The cause of your back pain may not be known. Some common causes of back pain include: °· Strain of the muscles or ligaments supporting the spine. °· Wear and tear (degeneration) of the spinal disks. °· Arthritis. °· Direct injury to the back. °For many people, back pain may return. Since back pain is rarely dangerous, most people can learn to manage this condition on their own. °HOME CARE INSTRUCTIONS °Watch your back pain for any changes. The following actions may help to lessen any discomfort you are feeling: °· Remain active. It is stressful on your back to sit or stand in one place for long periods of time. Do not sit, drive, or stand in one place for more than 30 minutes at a time. Take short walks on even surfaces as soon as you are able. Try to increase the length of time you walk each day. °· Exercise regularly as directed by your health care provider. Exercise helps your back heal faster. It also helps avoid future injury by keeping your muscles strong and flexible. °· Do not stay in bed. Resting more than 1-2 days can delay your recovery. °· Pay attention to your body when you bend and lift. The most comfortable positions are those that put less stress on your recovering back. Always use proper lifting techniques, including: °¨ Bending your knees. °¨ Keeping the load close to your body. °¨ Avoiding twisting. °· Find a comfortable position to sleep. Use a firm mattress and lie on your side with your knees slightly bent. If you lie on your back, put a pillow under your knees. °· Avoid feeling anxious or stressed. Stress increases muscle tension and can worsen back pain. It is important to recognize when you are anxious or stressed and learn ways to manage it, such as with exercise. °· Take medicines only as directed by your health care provider. Over-the-counter  medicines to reduce pain and inflammation are often the most helpful. Your health care provider may prescribe muscle relaxant drugs. These medicines help dull your pain so you can more quickly return to your normal activities and healthy exercise. °· Apply ice to the injured area: °¨ Put ice in a plastic bag. °¨ Place a towel between your skin and the bag. °¨ Leave the ice on for 20 minutes, 2-3 times a day for the first 2-3 days. After that, ice and heat may be alternated to reduce pain and spasms. °· Maintain a healthy weight. Excess weight puts extra stress on your back and makes it difficult to maintain good posture. °SEEK MEDICAL CARE IF: °· You have pain that is not relieved with rest or medicine. °· You have increasing pain going down into the legs or buttocks. °· You have pain that does not improve in one week. °· You have night pain. °· You lose weight. °· You have a fever or chills. °SEEK IMMEDIATE MEDICAL CARE IF:  °· You develop new bowel or bladder control problems. °· You have unusual weakness or numbness in your arms or legs. °· You develop nausea or vomiting. °· You develop abdominal pain. °· You feel faint. °  °This information is not intended to replace advice given to you by your health care provider. Make sure you discuss any questions you have with your health care provider. °  °Document Released: 05/15/2005 Document Revised: 06/05/2014 Document Reviewed: 09/16/2013 °Elsevier Interactive Patient Education ©2016 [employer] ° °Muscle Pain, Adult °Muscle pain (myalgia) may be caused by many things, including: °· Overuse or muscle strain, especially if you are not in shape. This is the most common cause of muscle pain. °· Injury. °· Bruises. °· Viruses, such as the flu. °· Infectious diseases. °· Fibromyalgia, which is a chronic condition that causes muscle tenderness, fatigue, and headache. °· Autoimmune diseases, including lupus. °· Certain drugs, including ACE inhibitors and statins. °Muscle  pain may be mild or severe. In most cases, the pain lasts only a short time and goes away without treatment. To diagnose the cause of your muscle pain, your health care provider will take your medical history. This means he or she will ask you when your muscle pain began and what has been happening. If you have not had muscle pain for very long, your health care provider may want to wait before doing much testing. If your muscle pain has lasted a long time, your health care provider may want to run tests right away. If your health care provider thinks your muscle pain may be caused by illness, you may need to have additional tests to rule out certain conditions.  °Treatment for muscle pain depends on the cause. Home care is often enough to relieve muscle pain. Your health care provider may also prescribe anti-inflammatory medicine. °HOME CARE INSTRUCTIONS °Watch your condition for any changes. The following actions may help to lessen any discomfort you are feeling: °· Only take over-the-counter or prescription medicines as directed by your health care provider. °· Apply ice to the sore muscle: °¨ Put ice in a plastic bag. °¨ Place a towel between your skin and the bag. °¨ Leave the ice on for 15-20 minutes, 3-4 times a day. °· You may alternate applying hot and cold packs to the muscle as directed by your health care provider. °· If overuse is causing your muscle pain, slow down your activities until the pain goes away. °¨ Remember that it is normal to feel some muscle pain after starting a workout program. Muscles that have not been used often will be sore at first. °¨ Do regular, gentle exercises if you are not usually active. °¨ Warm up before exercising to lower your risk of muscle pain. °· Do not continue working out if the pain is very bad. Bad pain could mean you have injured a muscle. °SEEK MEDICAL CARE IF: °· Your muscle pain gets worse, and medicines do not help. °· You have muscle pain that lasts longer  than 3 days. °· You have a rash or fever along with muscle pain. °· You have muscle pain after a tick bite. °· You have muscle pain while working out, even though you are in good physical condition. °· You have redness, soreness, or swelling along with muscle pain. °· You have muscle pain after starting a new medicine or changing the dose of a medicine. °SEEK IMMEDIATE MEDICAL CARE IF: °· You have trouble breathing. °· You have trouble swallowing. °· You have muscle pain along with a stiff neck, fever, and vomiting. °· You have severe muscle weakness or cannot move part of your body. °MAKE SURE YOU:  °· Understand these instructions. °· Will watch your condition. °· Will get help right away if you are not doing well or get worse. °  °This information is not intended to replace advice given to you by your health care provider. Make sure you discuss any questions you have with your health care provider. °  °  Document Released: 04/06/2006 Document Revised: 06/05/2014 Document Reviewed: 03/11/2013 °Elsevier Interactive Patient Education ©2016 Elsevier Inc. ° °

## 2015-08-25 ENCOUNTER — Encounter: Payer: Self-pay | Admitting: Family Medicine

## 2015-08-25 ENCOUNTER — Ambulatory Visit (INDEPENDENT_AMBULATORY_CARE_PROVIDER_SITE_OTHER): Payer: Medicaid Other | Admitting: Family Medicine

## 2015-08-25 VITALS — BP 109/73 | HR 85 | Temp 97.8°F | Ht 66.0 in | Wt 179.0 lb

## 2015-08-25 DIAGNOSIS — S93422A Sprain of deltoid ligament of left ankle, initial encounter: Secondary | ICD-10-CM | POA: Diagnosis present

## 2015-08-25 DIAGNOSIS — M549 Dorsalgia, unspecified: Secondary | ICD-10-CM | POA: Diagnosis not present

## 2015-08-25 MED ORDER — ALBUTEROL SULFATE HFA 108 (90 BASE) MCG/ACT IN AERS: 2.0000 | INHALATION_SPRAY | Freq: Four times a day (QID) | RESPIRATORY_TRACT | Status: DC | PRN

## 2015-08-25 NOTE — Progress Notes (Signed)
   Subjective:    Patient ID: Daniel Holmes, male    DOB: May 26, 1971, 45 y.o.   MRN: [id number]  HPI  Back pain Had to go to ER via ambulance when had sudden spasm.  Given lidocaine and NSAID.  No pain now.  Moving well.  No weakness or numbness   Ankle Sprain Wearing flip  Flops and tripped 2-3 days ago.  No pain but has swelling and bruising. Able to walk on it ok.  No fever or other joint pain  Chief Complaint noted Review of Symptoms - see HPI PMH - Smoking status noted.   Vital Signs reviewed   Review of Systems     Objective:   Physical Exam Alert nad Able to walk on heels and toes and deep bend.  No weakness or pain in left ankle. Back - no tenderness or deformity Left ankle - moderate lateral swelling with dependent ecchymosis.  No focal tenderness or tenderness with range of motion        Assessment & Plan:   Ankle sprain - grade 1 continue current tx  Back pain resolved.    Renewed MDI albuterol

## 2015-08-25 NOTE — Patient Instructions (Addendum)
Good to see you today!  Thanks for coming in.  Your back is normal.  Lift heavy objects with your legs not your back  Your ankle should slowly become less swollen,  Let us know if you have any pain  Use the inhaler as needed for wheezing.  Avoid smoke

## 2015-09-07 ENCOUNTER — Other Ambulatory Visit: Payer: Self-pay | Admitting: Family Medicine

## 2015-09-29 ENCOUNTER — Ambulatory Visit (INDEPENDENT_AMBULATORY_CARE_PROVIDER_SITE_OTHER): Payer: Medicaid Other | Admitting: Family Medicine

## 2015-09-29 ENCOUNTER — Encounter: Payer: Self-pay | Admitting: Family Medicine

## 2015-09-29 VITALS — BP 102/83 | HR 100 | Temp 97.9°F | Ht 66.0 in | Wt 178.0 lb

## 2015-09-29 DIAGNOSIS — Z79899 Other long term (current) drug therapy: Secondary | ICD-10-CM | POA: Diagnosis present

## 2015-09-29 DIAGNOSIS — E038 Other specified hypothyroidism: Secondary | ICD-10-CM

## 2015-09-29 DIAGNOSIS — Z008 Encounter for other general examination: Secondary | ICD-10-CM | POA: Insufficient documentation

## 2015-09-29 DIAGNOSIS — J302 Other seasonal allergic rhinitis: Secondary | ICD-10-CM

## 2015-09-29 LAB — CBC
HEMATOCRIT: 37.4 % — AB (ref 38.5–50.0)
HEMOGLOBIN: 12.9 g/dL — AB (ref 13.2–17.1)
MCH: 30.4 pg (ref 27.0–33.0)
MCHC: 34.5 g/dL (ref 32.0–36.0)
MCV: 88.2 fL (ref 80.0–100.0)
MPV: 10 fL (ref 7.5–12.5)
Platelets: 187 10*3/uL (ref 140–400)
RBC: 4.24 MIL/uL (ref 4.20–5.80)
RDW: 13.8 % (ref 11.0–15.0)
WBC: 5.8 10*3/uL (ref 3.8–10.8)

## 2015-09-29 MED ORDER — FLUTICASONE PROPIONATE 50 MCG/ACT NA SUSP: 2.0000 | Freq: Every day | NASAL | Status: DC

## 2015-09-29 NOTE — Assessment & Plan Note (Signed)
Stable.  Will check labs including lithium

## 2015-09-29 NOTE — Progress Notes (Signed)
   Subjective:    Patient ID: Daniel Holmes, male    DOB: 11/08/1970, 45 y.o.   MRN: [id number]  HPI  HEADACHE  Has been complaining of headache on and off for last few weeks.  Usually responds to tylenol.   Severity:  usuallly mild according to supervisor Location: all over Medications tried: tylenol helps but he ran out Head trauma: no Sudden onset: no Previous similar headaches: he believes has had headache in past Taking blood thinners: no History of cancer: no  Symptoms Nose congestion stuffiness:  no Nausea vomiting: no Photophobia: no Noise sensitivity: mild Double vision or loss of vision: no Fever: no Neck Stiffness: no Trouble walking or speaking: no  Review of Symptoms - see HPI PMH - Smoking status noted.    High Risk Medications  prescribed by psychiatry for bipolar and depression.  He denies any rashes, bruising, bleeding, weight loss or fevers  Bronchospasm Uses as needed albuterol when "he feels he needs it"  According to caretaker does not cough frequently nor seem to have shortness of breath.  No chest pain or fevers or audilbe wheeze  Chief Complaint noted Review of Symptoms - see HPI PMH - Smoking status noted.   Vital Signs reviewed   Review of Systems     Objective:   Physical Exam Alert cooperative Heart - Regular rate and rhythm.  No murmurs, gallops or rubs.    Lungs:  Normal respiratory effort, chest expands symmetrically. Lungs are clear to auscultation, no crackles or wheezes. Skin:  Intact without suspicious lesions or rashes Neurologic exam : Cn 2-7 intact Strength equal & normal in upper & lower extremities Able to walk on heels and toes.   Balance normal  Romberg normal, finger to nose normal Eye - Pupils Equal Round Reactive to light, Extraocular movements intact, Fundi without hemorrhage or visible lesions, Conjunctiva without redness or discharge         Assessment & Plan:   Headaches - seem to be tension vs  migraine.  No red flags by exam or history.  He has a history of frequent transient somatic complaints.  Will try tylenol and monitor  Bronchospasm No signs of asthma or COPD on exam.  Ok for infrequent as needed use of bronchodilator but again may be somatic

## 2015-09-29 NOTE — Patient Instructions (Signed)
Good to see you today!  Thanks for coming in.  For the headaches  When they start take Tylenol  See your eye doctor for an exam  If they are worsening or you have weakness then come back  I will send lab tests to your psychiatrist and to you  I will call if something is wrong  The ankle swelling will improve over the next 6 months

## 2015-09-30 LAB — LITHIUM LEVEL: LITHIUM LVL: 0.7 meq/L — AB (ref 0.80–1.40)

## 2015-09-30 LAB — COMPREHENSIVE METABOLIC PANEL
ALBUMIN: 4.5 g/dL (ref 3.6–5.1)
ALT: 17 U/L (ref 9–46)
AST: 18 U/L (ref 10–40)
Alkaline Phosphatase: 75 U/L (ref 40–115)
BUN: 11 mg/dL (ref 7–25)
CALCIUM: 9.6 mg/dL (ref 8.6–10.3)
CO2: 22 mmol/L (ref 20–31)
Chloride: 108 mmol/L (ref 98–110)
Creat: 0.88 mg/dL (ref 0.60–1.35)
Glucose, Bld: 92 mg/dL (ref 65–99)
POTASSIUM: 3.9 mmol/L (ref 3.5–5.3)
Sodium: 140 mmol/L (ref 135–146)
Total Bilirubin: 0.4 mg/dL (ref 0.2–1.2)
Total Protein: 6.4 g/dL (ref 6.1–8.1)

## 2015-09-30 LAB — TSH: TSH: 0.66 mIU/L (ref 0.40–4.50)

## 2015-10-01 ENCOUNTER — Encounter: Payer: Self-pay | Admitting: Family Medicine

## 2015-10-13 ENCOUNTER — Other Ambulatory Visit: Payer: Self-pay | Admitting: Family Medicine

## 2015-11-11 ENCOUNTER — Other Ambulatory Visit: Payer: Self-pay | Admitting: Family Medicine

## 2015-12-09 ENCOUNTER — Other Ambulatory Visit: Payer: Self-pay | Admitting: Family Medicine

## 2015-12-09 ENCOUNTER — Encounter (HOSPITAL_COMMUNITY): Payer: Self-pay | Admitting: *Deleted

## 2015-12-09 DIAGNOSIS — R51 Headache: Secondary | ICD-10-CM | POA: Insufficient documentation

## 2015-12-09 DIAGNOSIS — Z79899 Other long term (current) drug therapy: Secondary | ICD-10-CM | POA: Diagnosis not present

## 2015-12-09 DIAGNOSIS — Z87891 Personal history of nicotine dependence: Secondary | ICD-10-CM | POA: Diagnosis not present

## 2015-12-09 DIAGNOSIS — J45909 Unspecified asthma, uncomplicated: Secondary | ICD-10-CM | POA: Insufficient documentation

## 2015-12-09 NOTE — ED Notes (Signed)
Pt c/o headaches everyday and states that Tylenol does not help.

## 2015-12-10 ENCOUNTER — Emergency Department (HOSPITAL_COMMUNITY)
Admission: EM | Admit: 2015-12-10 | Discharge: 2015-12-10 | Disposition: A | Discharge status: Home / Self Care | Payer: Medicaid Other | Attending: Emergency Medicine | Admitting: Emergency Medicine

## 2015-12-10 ENCOUNTER — Telehealth: Payer: Self-pay | Admitting: *Deleted

## 2015-12-10 DIAGNOSIS — R519 Headache, unspecified: Secondary | ICD-10-CM

## 2015-12-10 DIAGNOSIS — R51 Headache: Secondary | ICD-10-CM

## 2015-12-10 MED ORDER — BUTALBITAL-APAP-CAFFEINE 50-325-40 MG PO TABS: 1.0000 | ORAL_TABLET | Freq: Three times a day (TID) | ORAL | Status: DC | PRN

## 2015-12-10 MED ORDER — METOCLOPRAMIDE HCL 5 MG/ML IJ SOLN
10.0000 mg | INTRAMUSCULAR | Status: AC
  Administered 2015-12-10: 10 mg via INTRAVENOUS
  Filled 2015-12-10: qty 2

## 2015-12-10 MED ORDER — KETOROLAC TROMETHAMINE 30 MG/ML IJ SOLN
30.0000 mg | Freq: Once | INTRAMUSCULAR | Status: AC
  Administered 2015-12-10: 30 mg via INTRAVENOUS
  Filled 2015-12-10: qty 1

## 2015-12-10 NOTE — Discharge Instructions (Signed)

## 2015-12-10 NOTE — ED Provider Notes (Signed)
CSN: [id number]     Arrival date & time 12/09/15  2124 History   First MD Initiated Contact with Patient 12/10/15 0019     Chief Complaint  Patient presents with  . Headache     (Consider location/radiation/quality/duration/timing/severity/associated sxs/prior Treatment) HPI Comments: 45 year old male with history of psychosis, mental developmental delay, and bipolar 1 disorder presents to the emergency department for complaints of headache. He states that his headache has been present for 4 days and has been constant. It is gradually worsening. Pain is located in the frontal and temporal region. He has taken Tylenol for symptoms without relief. He denies any head injury or trauma. He states that he has had similar headaches in the past; most recently 1 year ago. He denies any fever, blurry vision, vision loss, tinnitus, hearing loss, photophobia, phonophobia, nausea, vomiting, extremity numbness, or extremity weakness.  Patient is a 45 y.o. male presenting with headaches. The history is provided by the patient. No language interpreter was used.  Headache Associated symptoms: no fever, no nausea, no numbness, no photophobia, no vomiting and no weakness     Past Medical History  Diagnosis Date  . Psychosis     See Dr Laverta [place] [name]  . Asthma   . Mental developmental delay   . Bipolar 1 disorder [facility])    Past Surgical History  Procedure Laterality Date  . Hernia repair     No family history on file. Social History  Substance Use Topics  . Smoking status: Former Smoker -- 0.00 packs/day    Types: Cigarettes  . Smokeless tobacco: None  . Alcohol Use: No    Review of Systems  Constitutional: Negative for fever.  HENT: Negative for tinnitus.   Eyes: Negative for photophobia and visual disturbance.  Gastrointestinal: Negative for nausea and vomiting.  Neurological: Positive for headaches. Negative for weakness and numbness.  All other systems reviewed and  are negative.   Allergies  Penicillins  Home Medications   Prior to Admission medications   Medication Sig Start Date End Date Taking? Authorizing Provider  acetaminophen (TYLENOL) 500 MG tablet Take 2 tablets (1,000 mg total) by mouth 2 (two) times daily as needed (knee pain). 06/09/15   Lind Covert, MD  butalbital-acetaminophen-caffeine (FIORICET) [phone] MG tablet Take 1-2 tablets by mouth every 8 (eight) hours as needed for headache. 12/10/15 12/09/16  Antonietta Breach, PA-C  cloNIDine (CATAPRES) 0.1 MG tablet Take 1 tablet (0.1 mg total) by mouth 2 (two) times daily. 11/10/14   Clovis Fredrickson, MD  fluticasone (FLONASE) 50 MCG/ACT nasal spray Place 2 sprays into both nostrils daily. 09/29/15   Lind Covert, MD  lamoTRIgine (LAMICTAL) 100 MG tablet Take 100 mg by mouth daily.    Historical Provider, MD  lamoTRIgine (LAMICTAL) 25 MG tablet Take 100 mg by mouth at bedtime.    Historical Provider, MD  levothyroxine (SYNTHROID, LEVOTHROID) 75 MCG tablet TAKE (1) TABLET BY MOUTH ONCE DAILY BEFORE BREAKFAST. 09/08/15   Lind Covert, MD  lidocaine (XYLOCAINE) 5 % ointment APPLY (1) APPLICATION TOPICALLY AS NEEDED FOR LOWER BACK PAIN. 12/09/15   Lind Covert, MD  lithium carbonate (ESKALITH) 450 MG CR tablet Take 1 tablet (450 mg total) by mouth every 12 (twelve) hours. 11/10/14   Clovis Fredrickson, MD  Multiple Vitamins-Minerals (MULTIVITAMIN ADULTS 50+) TABS Take 1 tablet by mouth daily. 11/10/14   Clovis Fredrickson, MD  naproxen (NAPROSYN) 500 MG tablet Take 1 tablet (500 mg total) by mouth  2 (two) times daily with a meal. 08/13/15   Varney Biles, MD  OLANZapine (ZYPREXA) 20 MG tablet Take 1.5 tablets (30 mg total) by mouth at bedtime. Take 1 and 1/2 tablets by mouth at bedtime. Patient taking differently: Take 30 mg by mouth daily. Take 1 and 1/2 tablets by mouth at bedtime. 11/10/14   Clovis Fredrickson, MD  omeprazole (PRILOSEC) 10 MG capsule TAKE 1 CAPSULE  BY MOUTH ONCE A DAY. 11/11/15   Lind Covert, MD  PROAIR HFA 108 (90 Base) MCG/ACT inhaler INHALE 2 PUFFS INTO LUNGS EVERY 6 HOURS AS NEEDED FOR WHEEZING OR SHORTNESS OF BREATH. 11/11/15   Lind Covert, MD  traZODone (DESYREL) 150 MG tablet Take 1 tablet (150 mg total) by mouth at bedtime. 11/10/14   Clovis Fredrickson, MD  trolamine salicylate (ASPERCREME/ALOE) 10 % cream Apply 1 application topically 4 (four) times daily as needed for muscle pain. 12/02/14   Lind Covert, MD  Vitamin D, Ergocalciferol, (DRISDOL) 50000 UNITS CAPS capsule Take 1 capsule (50,000 Units total) by mouth every 14 (fourteen) days. 11/10/14   Jolanta B Pucilowska, MD   BP 113/65 mmHg  Pulse 81  Temp(Src) 98.5 F (36.9 C) (Oral)  Resp 16  SpO2 99%   Physical Exam  Constitutional: He is oriented to person, place, and time. He appears well-developed and well-nourished. No distress.  Nontoxic appearing and in no distress  HENT:  Head: Normocephalic and atraumatic.  Eyes: Conjunctivae and EOM are normal. No scleral icterus.  Neck: Normal range of motion.  No nuchal rigidity or meningismus  Cardiovascular: Normal rate, regular rhythm and intact distal pulses.   Pulmonary/Chest: Effort normal. No respiratory distress. He has no wheezes.  Respirations even and unlabored  Musculoskeletal: Normal range of motion.  Neurological: He is alert and oriented to person, place, and time. No cranial nerve deficit. He exhibits normal muscle tone. Coordination normal.  GCS 15. Speech is goal oriented. No cranial nerve deficits appreciated. Symmetric eyebrow raise, no facial drooping, tongue midline. Patient has equal grip strength bilaterally with 5/5 strength against resistance in all major muscle groups bilaterally. Sensation to light touch intact. Patient ambulatory with steady gait.  Skin: Skin is warm and dry. No rash noted. He is not diaphoretic. No erythema. No pallor.  Psychiatric: He has a normal mood  and affect. His behavior is normal.  Nursing note and vitals reviewed.   ED Course  Procedures (including critical care time) Labs Review Labs Reviewed - No data to display  Imaging Review No results found.   I have personally reviewed and evaluated these images and lab results as part of my medical decision-making.   EKG Interpretation None      MDM   Final diagnoses:  Acute nonintractable headache, unspecified headache type    45 year old male presents to the emergency department for evaluation of a frontal and temporal headache which has been constant and worsening over the past 4 days. Symptoms unrelieved by Tylenol. He reports a history of similar headaches approximately one year ago. Patient is afebrile. No nuchal rigidity or meningismus. Doubt meningitis. Neurologic exam nonfocal.  Symptoms resolved with Toradol and Reglan. Vitals remained stable. Patient well-appearing. He states that he is ready to go home. I believe that he is stable for discharge. No indication for further emergent workup. Return precautions provided. Patient discharged in satisfactory condition with no unaddressed concerns.   Filed Vitals:   12/09/15 2136 12/10/15 0135 12/10/15 0136 12/10/15 0145  BP: 118/73  116/85  113/65  Pulse: 72  87 81  Temp: 98.5 F (36.9 C)     TempSrc: Oral     Resp: 20   16  SpO2: 100%  100% 99%     Antonietta Breach, PA-C 12/10/15 0303  Everlene Balls, MD 12/10/15 [phone]

## 2015-12-14 ENCOUNTER — Other Ambulatory Visit: Payer: Self-pay | Admitting: *Deleted

## 2015-12-19 NOTE — Progress Notes (Signed)
Subjective:     Patient ID: Daniel Holmes, male   DOB: 02-19-1971, 45 y.o.   MRN: [id number]  HPI Daniel Holmes is a 45yo male presenting for ED follow up of Headaches. - Recent ED visit for headaches on 12/09/15. Reported constant headache for 4 days. Not relieved with Tylenol. Resolved with Toradol and Reglan. - Continues to note mild frontal headache which started this morning - Has tried Tylenol, which didn't help - Was given a small number of Fioricet at ED. States this helped significantly. - Denies chest pain, shortness of breath, blurred vision, numbness, tingling, weakness - Reports photophobia, but denies phonophobia or flashers  Review of Systems Per HPI. Other systems negative.    Objective:   Physical Exam  Constitutional: He appears well-developed and well-nourished. No distress.  Cardiovascular: Normal rate and regular rhythm.   No murmur heard. Pulmonary/Chest: Effort normal. No respiratory distress. He has no wheezes.  Musculoskeletal:  Muscle strength 5/5 in upper and lower extremities  Neurological: No cranial nerve deficit.  Sensation intact in upper and lower extremities       Assessment and Plan:     1. Episodic tension-type headache, not intractable - No neurological abnormalities noted - BP initially elevated to 149/129, improved to 112/76 on manual recheck - Fioricet refilled. Further refills to come from PCP - Follow up if symptoms worsen or fail to resolve

## 2015-12-20 ENCOUNTER — Ambulatory Visit (INDEPENDENT_AMBULATORY_CARE_PROVIDER_SITE_OTHER): Payer: Medicaid Other | Admitting: Family Medicine

## 2015-12-20 VITALS — BP 112/76 | Wt 177.0 lb

## 2015-12-20 DIAGNOSIS — G44219 Episodic tension-type headache, not intractable: Secondary | ICD-10-CM | POA: Diagnosis present

## 2015-12-20 MED ORDER — BUTALBITAL-APAP-CAFFEINE 50-325-40 MG PO TABS: 1.0000 | ORAL_TABLET | Freq: Three times a day (TID) | ORAL | 0 refills | Status: DC | PRN

## 2015-12-20 NOTE — Patient Instructions (Signed)
Thank you so much for coming to visit me today! I have refilled your Fioricet. If you develop worsening headaches, please let us know. Please follow up with your Primary Physician.  Dr. Gerlean Ren

## 2016-01-04 ENCOUNTER — Encounter (HOSPITAL_COMMUNITY): Payer: Self-pay | Admitting: Family Medicine

## 2016-01-04 ENCOUNTER — Ambulatory Visit (HOSPITAL_COMMUNITY)
Admission: EM | Admit: 2016-01-04 | Discharge: 2016-01-04 | Disposition: A | Discharge status: Home / Self Care | Payer: Medicaid Other | Attending: Family Medicine | Admitting: Family Medicine

## 2016-01-04 DIAGNOSIS — G43001 Migraine without aura, not intractable, with status migrainosus: Secondary | ICD-10-CM | POA: Diagnosis not present

## 2016-01-04 MED ORDER — NAPROXEN 500 MG PO TABS: 500.0000 mg | ORAL_TABLET | Freq: Two times a day (BID) | ORAL | 0 refills | Status: DC

## 2016-01-04 NOTE — ED Provider Notes (Signed)
CSN: [id number]     Arrival date & time 01/04/16  1122 History   First MD Initiated Contact with Patient 01/04/16 1222     No chief complaint on file.  (Consider location/radiation/quality/duration/timing/severity/associated sxs/prior Treatment) Patient is having headache and flashing lights in his left eye.  He took a headache medicine and it did not help.    Past Medical History:  Diagnosis Date  . Asthma   . Bipolar 1 disorder ([place])   . Mental developmental delay   . Psychosis    See Dr [name]   Past Surgical History:  Procedure Laterality Date  . HERNIA REPAIR     No family history on file. Social History  Substance Use Topics  . Smoking status: Former Smoker    Packs/day: 0.00    Types: Cigarettes  . Smokeless tobacco: Not on file  . Alcohol use No    Review of Systems  Constitutional: Negative.   Eyes: Negative.   Respiratory: Negative.   Cardiovascular: Negative.   Gastrointestinal: Negative.   Endocrine: Negative.   Genitourinary: Negative.   Musculoskeletal: Negative.   Skin: Negative.   Allergic/Immunologic: Negative.   Neurological: Positive for headaches.  Hematological: Negative.   Psychiatric/Behavioral: Negative.     Allergies  Penicillins  Home Medications   Prior to Admission medications   Medication Sig Start Date End Date Taking? Authorizing Provider  acetaminophen (TYLENOL) 500 MG tablet Take 2 tablets (1,000 mg total) by mouth 2 (two) times daily as needed (knee pain). 06/09/15   Lind Covert, MD  butalbital-acetaminophen-caffeine (FIORICET) [phone] MG tablet Take 1-2 tablets by mouth every 8 (eight) hours as needed for headache. 12/20/15 12/19/16  Burna Cash Rumley, DO  cloNIDine (CATAPRES) 0.1 MG tablet Take 1 tablet (0.1 mg total) by mouth 2 (two) times daily. 11/10/14   Clovis Fredrickson, MD  fluticasone (FLONASE) 50 MCG/ACT nasal spray Place 2 sprays into both nostrils daily. 09/29/15   Lind Covert, MD  lamoTRIgine (LAMICTAL) 100 MG tablet Take 100 mg by mouth daily.    Historical Provider, MD  lamoTRIgine (LAMICTAL) 25 MG tablet Take 100 mg by mouth at bedtime.    Historical Provider, MD  levothyroxine (SYNTHROID, LEVOTHROID) 75 MCG tablet TAKE (1) TABLET BY MOUTH ONCE DAILY BEFORE BREAKFAST. 09/08/15   Lind Covert, MD  lidocaine (XYLOCAINE) 5 % ointment APPLY (1) APPLICATION TOPICALLY AS NEEDED FOR LOWER BACK PAIN. 12/09/15   Lind Covert, MD  lithium carbonate (ESKALITH) 450 MG CR tablet Take 1 tablet (450 mg total) by mouth every 12 (twelve) hours. 11/10/14   Clovis Fredrickson, MD  Multiple Vitamins-Minerals (MULTIVITAMIN ADULTS 50+) TABS Take 1 tablet by mouth daily. 11/10/14   Clovis Fredrickson, MD  naproxen (NAPROSYN) 500 MG tablet Take 1 tablet (500 mg total) by mouth 2 (two) times daily with a meal. 08/13/15   Ankit Nanavati, MD  OLANZapine (ZYPREXA) 20 MG tablet Take 1.5 tablets (30 mg total) by mouth at bedtime. Take 1 and 1/2 tablets by mouth at bedtime. Patient taking differently: Take 30 mg by mouth daily. Take 1 and 1/2 tablets by mouth at bedtime. 11/10/14   Clovis Fredrickson, MD  omeprazole (PRILOSEC) 10 MG capsule TAKE 1 CAPSULE BY MOUTH ONCE A DAY. 11/11/15   Lind Covert, MD  PROAIR HFA 108 (90 Base) MCG/ACT inhaler INHALE 2 PUFFS INTO LUNGS EVERY 6 HOURS AS NEEDED FOR WHEEZING OR SHORTNESS OF BREATH. 11/11/15   Lind Covert,  MD  traZODone (DESYREL) 150 MG tablet Take 1 tablet (150 mg total) by mouth at bedtime. 11/10/14   Clovis Fredrickson, MD  trolamine salicylate (ASPERCREME/ALOE) 10 % cream Apply 1 application topically 4 (four) times daily as needed for muscle pain. 12/02/14   Lind Covert, MD  Vitamin D, Ergocalciferol, (DRISDOL) 50000 UNITS CAPS capsule Take 1 capsule (50,000 Units total) by mouth every 14 (fourteen) days. 11/10/14   Clovis Fredrickson, MD   Meds Ordered and Administered this Visit  Medications -  No data to display  There were no vitals taken for this visit. No data found.   Physical Exam  Constitutional: He is oriented to person, place, and time. He appears well-developed and well-nourished.  HENT:  Head: Normocephalic and atraumatic.  Right Ear: External ear normal.  Left Ear: External ear normal.  Eyes: Conjunctivae and EOM are normal. Pupils are equal, round, and reactive to light.  Neck: Normal range of motion. Neck supple.  Cardiovascular: Normal rate, regular rhythm and normal heart sounds.   Pulmonary/Chest: Effort normal and breath sounds normal.  Abdominal: Soft. Bowel sounds are normal.  Neurological: He is alert and oriented to person, place, and time.  Vitals reviewed.   Urgent Care Course   Clinical Course    Procedures (including critical care time)  Labs Review Labs Reviewed - No data to display  Imaging Review No results found.   Visual Acuity Review  Right Eye Distance:   Left Eye Distance:   Bilateral Distance:    Right Eye Near:   Left Eye Near:    Bilateral Near:         MDM  Headache - Patient is having migraine headache.  He has headache meds at home. Naprosyn 500mg  one po bid x 10 days #20 and advised him to take this including headache meds.      Lysbeth Penner, FNP 01/04/16 1330

## 2016-01-04 NOTE — ED Triage Notes (Signed)
Patient complains of headache, non-specific with duration.  Patient worries about blood pressure.  Patient thinks blood pressure too high with head pain.  Denies any other complaints

## 2016-01-10 ENCOUNTER — Emergency Department (HOSPITAL_COMMUNITY)
Admission: EM | Admit: 2016-01-10 | Discharge: 2016-01-10 | Disposition: A | Discharge status: Home / Self Care | Payer: Medicaid Other | Attending: Emergency Medicine | Admitting: Emergency Medicine

## 2016-01-10 ENCOUNTER — Encounter (HOSPITAL_COMMUNITY): Payer: Self-pay | Admitting: *Deleted

## 2016-01-10 DIAGNOSIS — E039 Hypothyroidism, unspecified: Secondary | ICD-10-CM | POA: Diagnosis not present

## 2016-01-10 DIAGNOSIS — J45901 Unspecified asthma with (acute) exacerbation: Secondary | ICD-10-CM | POA: Diagnosis not present

## 2016-01-10 DIAGNOSIS — Z87891 Personal history of nicotine dependence: Secondary | ICD-10-CM | POA: Insufficient documentation

## 2016-01-10 DIAGNOSIS — F909 Attention-deficit hyperactivity disorder, unspecified type: Secondary | ICD-10-CM | POA: Insufficient documentation

## 2016-01-10 DIAGNOSIS — R0602 Shortness of breath: Secondary | ICD-10-CM | POA: Diagnosis present

## 2016-01-10 MED ORDER — PREDNISONE 50 MG PO TABS: 50.0000 mg | ORAL_TABLET | Freq: Every day | ORAL | 0 refills | Status: DC

## 2016-01-10 MED ORDER — PREDNISONE 20 MG PO TABS
60.0000 mg | ORAL_TABLET | Freq: Once | ORAL | Status: AC
  Administered 2016-01-10: 60 mg via ORAL
  Filled 2016-01-10: qty 3

## 2016-01-10 MED ORDER — BENZONATATE 100 MG PO CAPS: 100.0000 mg | ORAL_CAPSULE | Freq: Three times a day (TID) | ORAL | 0 refills | Status: DC

## 2016-01-10 MED ORDER — IPRATROPIUM-ALBUTEROL 0.5-2.5 (3) MG/3ML IN SOLN
3.0000 mL | Freq: Once | RESPIRATORY_TRACT | Status: AC
  Administered 2016-01-10: 3 mL via RESPIRATORY_TRACT
  Filled 2016-01-10: qty 3

## 2016-01-10 MED ORDER — BENZONATATE 100 MG PO CAPS
100.0000 mg | ORAL_CAPSULE | Freq: Once | ORAL | Status: AC
  Administered 2016-01-10: 100 mg via ORAL
  Filled 2016-01-10: qty 1

## 2016-01-10 NOTE — ED Triage Notes (Signed)
Pt reports having asthma and increase in symptoms, reports non productive cough. No relief with inhalers. Airway intact.

## 2016-01-10 NOTE — ED Provider Notes (Signed)
[facility] DEPT Provider Note   CSN: [id number] Arrival date & time: 01/10/16  1745  By signing my name below, I, Royce Macadamia, attest that this documentation has been prepared under the direction and in the presence of  Gloriann Loan, PA-C. Electronically Signed: Royce Macadamia, ED Scribe. 01/10/16. 7:48 PM.   History   Chief Complaint Chief Complaint  Patient presents with  . Asthma   The history is provided by the patient. No language interpreter was used.     HPI Comments:  Daniel Holmes is a 45 y.o. male with a history of asthma who presents to the Emergency Department complaining of dry cough, wheezing, and SOB x 2 days.  Pt notes his symptoms feel like his past asthma exacerbations. He has been using his albuterol inhaler without relief.  He denies fever and chest pain.    Past Medical History:  Diagnosis Date  . Asthma   . Bipolar 1 disorder ([place])   . Mental developmental delay   . Psychosis    See Dr Laverta Baltimore [name]    Patient Active Problem List   Diagnosis Date Noted  . Encounter for long-term (current) use of other high-risk medications 09/29/2015  . Left leg swelling 07/14/2015  . Suicidal ideation 06/17/2015  . Bipolar 1 disorder ([name]) 11/08/2014  . 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 Bipolar disorder with depression ([place]) 11/03/2014  . Arthritis 11/03/2014  . 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 History of ADHD 11/03/2014  . Allergic rhinitis 11/05/2012  . Hypothyroidism 03/28/2012    Past Surgical History:  Procedure Laterality Date  . HERNIA REPAIR         Home Medications    Prior to Admission medications   Medication Sig Start Date End Date Taking? Authorizing Provider  acetaminophen (TYLENOL) 500 MG tablet Take 2 tablets (1,000 mg total) by mouth 2 (two) times daily as needed (knee pain). 06/09/15   Lind Covert, MD  benzonatate (TESSALON) 100 MG capsule Take 1 capsule (100 mg total)  by mouth every 8 (eight) hours. 01/10/16   Gloriann Loan, PA-C  butalbital-acetaminophen-caffeine (FIORICET) [phone] MG tablet Take 1-2 tablets by mouth every 8 (eight) hours as needed for headache. 12/20/15 12/19/16  Burna Cash Rumley, DO  cloNIDine (CATAPRES) 0.1 MG tablet Take 1 tablet (0.1 mg total) by mouth 2 (two) times daily. 11/10/14   Clovis Fredrickson, MD  fluticasone (FLONASE) 50 MCG/ACT nasal spray Place 2 sprays into both nostrils daily. 09/29/15   Lind Covert, MD  lamoTRIgine (LAMICTAL) 100 MG tablet Take 100 mg by mouth daily.    Historical Provider, MD  lamoTRIgine (LAMICTAL) 25 MG tablet Take 100 mg by mouth at bedtime.    Historical Provider, MD  levothyroxine (SYNTHROID, LEVOTHROID) 75 MCG tablet TAKE (1) TABLET BY MOUTH ONCE DAILY BEFORE BREAKFAST. 09/08/15   Lind Covert, MD  lidocaine (XYLOCAINE) 5 % ointment APPLY (1) APPLICATION TOPICALLY AS NEEDED FOR LOWER BACK PAIN. 12/09/15   Lind Covert, MD  lithium carbonate (ESKALITH) 450 MG CR tablet Take 1 tablet (450 mg total) by mouth every 12 (twelve) hours. 11/10/14   Clovis Fredrickson, MD  Multiple Vitamins-Minerals (MULTIVITAMIN ADULTS 50+) TABS Take 1 tablet by mouth daily. 11/10/14   Clovis Fredrickson, MD  naproxen (NAPROSYN) 500 MG tablet Take 1 tablet (500 mg total) by mouth 2 (two) times daily with a meal. 01/04/16   Lysbeth Penner, FNP  naproxen (NAPROSYN) 500 MG tablet Take 1 tablet (  500 mg total) by mouth 2 (two) times daily with a meal. 01/04/16   Lysbeth Penner, FNP  OLANZapine (ZYPREXA) 20 MG tablet Take 1.5 tablets (30 mg total) by mouth at bedtime. Take 1 and 1/2 tablets by mouth at bedtime. Patient taking differently: Take 30 mg by mouth daily. Take 1 and 1/2 tablets by mouth at bedtime. 11/10/14   Clovis Fredrickson, MD  omeprazole (PRILOSEC) 10 MG capsule TAKE 1 CAPSULE BY MOUTH ONCE A DAY. 11/11/15   Lind Covert, MD  predniSONE (DELTASONE) 50 MG tablet Take 1 tablet (50 mg total)  by mouth daily. 01/10/16   Minaal Struckman, PA-C  PROAIR HFA 108 (90 Base) MCG/ACT inhaler INHALE 2 PUFFS INTO LUNGS EVERY 6 HOURS AS NEEDED FOR WHEEZING OR SHORTNESS OF BREATH. 11/11/15   Lind Covert, MD  traZODone (DESYREL) 150 MG tablet Take 1 tablet (150 mg total) by mouth at bedtime. 11/10/14   Clovis Fredrickson, MD  trolamine salicylate (ASPERCREME/ALOE) 10 % cream Apply 1 application topically 4 (four) times daily as needed for muscle pain. 12/02/14   Lind Covert, MD  Vitamin D, Ergocalciferol, (DRISDOL) 50000 UNITS CAPS capsule Take 1 capsule (50,000 Units total) by mouth every 14 (fourteen) days. 11/10/14   Clovis Fredrickson, MD    Family History History reviewed. No pertinent family history.  Social History Social History  Substance Use Topics  . Smoking status: Former Smoker    Packs/day: 0.00    Types: Cigarettes  . Smokeless tobacco: Not on file  . Alcohol use No     Allergies   Penicillins   Review of Systems Review of Systems  Constitutional: Negative for fever.  Respiratory: Positive for cough, shortness of breath and wheezing.   Cardiovascular: Negative for chest pain.     Physical Exam Updated Vital Signs BP 128/81 (BP Location: Left Arm)   Pulse 87   Temp 99.1 F (37.3 C) (Oral)   Resp 18   SpO2 100%   Physical Exam  Constitutional: He is oriented to person, place, and time. He appears well-developed and well-nourished.  Non-toxic appearance. He does not have a sickly appearance. He does not appear ill.  HENT:  Head: Normocephalic and atraumatic.  Mouth/Throat: Oropharynx is clear and moist.  Eyes: Conjunctivae are normal. Pupils are equal, round, and reactive to light.  Neck: Normal range of motion. Neck supple.  Cardiovascular: Normal rate and regular rhythm.   Pulmonary/Chest: Effort normal and breath sounds normal. No accessory muscle usage or stridor. No respiratory distress. He has no wheezes. He has no rhonchi. He has no rales.    Abdominal: Soft. Bowel sounds are normal. He exhibits no distension. There is no tenderness.  Musculoskeletal: Normal range of motion.  Lymphadenopathy:    He has no cervical adenopathy.  Neurological: He is alert and oriented to person, place, and time.  Speech clear without dysarthria.  Skin: Skin is warm and dry.  Psychiatric: He has a normal mood and affect. His behavior is normal.  Nursing note and vitals reviewed.    ED Treatments / Results   DIAGNOSTIC STUDIES:  Oxygen Saturation is 100% on RA, nml by my interpretation.    COORDINATION OF CARE:  7:48 PM Will give breathing treatment in the ED. Discussed treatment plan with pt at bedside and pt agreed to plan.  Labs (all labs ordered are listed, but only abnormal results are displayed) Labs Reviewed - No data to display  EKG  EKG Interpretation None  Radiology No results found.  Procedures Procedures (including critical care time)  Medications Ordered in ED Medications  benzonatate (TESSALON) capsule 100 mg (not administered)  ipratropium-albuterol (DUONEB) 0.5-2.5 (3) MG/3ML nebulizer solution 3 mL (3 mLs Nebulization Given 01/10/16 1955)  predniSONE (DELTASONE) tablet 60 mg (60 mg Oral Given 01/10/16 1955)     Initial Impression / Assessment and Plan / ED Course  I have reviewed the triage vital signs and the nursing notes.  Pertinent labs & imaging results that were available during my care of the patient were reviewed by me and considered in my medical decision making (see chart for details).  Clinical Course   Sxs c/w previous asthma exacerbations, no indication for imaging at this time. Do not suspect PNA. Oxygen saturations maintained >90, no current signs of respiratory distress. Lung exam improved after nebulizer treatment. Prednisone given in the ED and pt will bd dc with 5 day burst along with tessalon perles. Pt states they are breathing at baseline. Pt has been instructed to continue using  prescribed medications and to speak with PCP about today's exacerbation.    Final Clinical Impressions(s) / ED Diagnoses   Final diagnoses:  Asthma exacerbation    New Prescriptions New Prescriptions   BENZONATATE (TESSALON) 100 MG CAPSULE    Take 1 capsule (100 mg total) by mouth every 8 (eight) hours.   PREDNISONE (DELTASONE) 50 MG TABLET    Take 1 tablet (50 mg total) by mouth daily.   I personally performed the services described in this documentation, which was scribed in my presence. The recorded information has been reviewed and is accurate.     Gloriann Loan, PA-C 01/10/16 2037    Gloriann Loan, PA-C 01/10/16 2038    Forde Dandy, MD 01/11/16 [phone]

## 2016-01-18 ENCOUNTER — Encounter (HOSPITAL_COMMUNITY): Payer: Self-pay | Admitting: Emergency Medicine

## 2016-01-18 ENCOUNTER — Emergency Department (HOSPITAL_COMMUNITY)
Admission: EM | Admit: 2016-01-18 | Discharge: 2016-01-19 | Disposition: A | Discharge status: Home / Self Care | Payer: Medicaid Other | Attending: Emergency Medicine | Admitting: Emergency Medicine

## 2016-01-18 DIAGNOSIS — J45909 Unspecified asthma, uncomplicated: Secondary | ICD-10-CM | POA: Insufficient documentation

## 2016-01-18 DIAGNOSIS — R05 Cough: Secondary | ICD-10-CM | POA: Diagnosis present

## 2016-01-18 DIAGNOSIS — Z87891 Personal history of nicotine dependence: Secondary | ICD-10-CM | POA: Diagnosis not present

## 2016-01-18 DIAGNOSIS — E039 Hypothyroidism, unspecified: Secondary | ICD-10-CM | POA: Diagnosis not present

## 2016-01-18 DIAGNOSIS — R059 Cough, unspecified: Secondary | ICD-10-CM

## 2016-01-18 MED ORDER — ALBUTEROL SULFATE (2.5 MG/3ML) 0.083% IN NEBU
5.0000 mg | INHALATION_SOLUTION | Freq: Once | RESPIRATORY_TRACT | Status: AC
  Administered 2016-01-18: 5 mg via RESPIRATORY_TRACT
  Filled 2016-01-18: qty 6

## 2016-01-18 MED ORDER — BENZONATATE 100 MG PO CAPS
100.0000 mg | ORAL_CAPSULE | Freq: Once | ORAL | Status: AC
  Administered 2016-01-18: 100 mg via ORAL
  Filled 2016-01-18: qty 1

## 2016-01-18 MED ORDER — ALBUTEROL SULFATE (2.5 MG/3ML) 0.083% IN NEBU
INHALATION_SOLUTION | RESPIRATORY_TRACT | Status: AC
  Filled 2016-01-18: qty 6

## 2016-01-18 MED ORDER — IPRATROPIUM BROMIDE 0.02 % IN SOLN
0.5000 mg | Freq: Once | RESPIRATORY_TRACT | Status: AC
  Administered 2016-01-18: 0.5 mg via RESPIRATORY_TRACT
  Filled 2016-01-18: qty 2.5

## 2016-01-18 MED ORDER — PREDNISONE 20 MG PO TABS
60.0000 mg | ORAL_TABLET | Freq: Once | ORAL | Status: AC
  Administered 2016-01-18: 60 mg via ORAL
  Filled 2016-01-18: qty 3

## 2016-01-18 MED ORDER — ALBUTEROL SULFATE (2.5 MG/3ML) 0.083% IN NEBU
5.0000 mg | INHALATION_SOLUTION | Freq: Once | RESPIRATORY_TRACT | Status: AC
  Administered 2016-01-18: 5 mg via RESPIRATORY_TRACT

## 2016-01-18 NOTE — ED Triage Notes (Signed)
Pt sts increased cough today due to asthma

## 2016-01-18 NOTE — ED Provider Notes (Signed)
[facility] DEPT Provider Note   CSN: [id number] Arrival date & time: 01/18/16  1748     History   Chief Complaint Chief Complaint  Patient presents with  . Cough    HPI Daniel Holmes is a 45 y.o. male.  Patient with a history of asthma presents with complaint of progressively worsening dry cough that is persistent. He states he experiences these symptoms everyday but today symptoms became much worse. No fever, chest pain, or vomiting. He is a non-smoker. He endorses that current symptoms are typical of his asthma exacerbations.    The history is provided by the patient. No language interpreter was used.  Cough  This is a recurrent problem. The problem occurs constantly. The cough is non-productive. Associated symptoms include shortness of breath. Pertinent negatives include no chest pain, no chills and no myalgias.    Past Medical History:  Diagnosis Date  . Asthma   . Bipolar 1 disorder ([place])   . Mental developmental delay   . Psychosis    See Dr Laverta Baltimore [name]    Patient Active Problem List   Diagnosis Date Noted  . Encounter for long-term (current) use of other high-risk medications 09/29/2015  . Left leg swelling 07/14/2015  . Suicidal ideation 06/17/2015  . Bipolar 1 disorder ([place]) 11/08/2014  . 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 Bipolar disorder with depression ([place]) 11/03/2014  . Arthritis 11/03/2014  . 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 History of ADHD 11/03/2014  . Allergic rhinitis 11/05/2012  . Hypothyroidism 03/28/2012    Past Surgical History:  Procedure Laterality Date  . HERNIA REPAIR         Home Medications    Prior to Admission medications   Medication Sig Start Date End Date Taking? Authorizing Provider  acetaminophen (TYLENOL) 500 MG tablet Take 2 tablets (1,000 mg total) by mouth 2 (two) times daily as needed (knee pain). 06/09/15   Lind Covert, MD  benzonatate (TESSALON) 100 MG  capsule Take 1 capsule (100 mg total) by mouth every 8 (eight) hours. 01/10/16   Gloriann Loan, PA-C  butalbital-acetaminophen-caffeine (FIORICET) [phone] MG tablet Take 1-2 tablets by mouth every 8 (eight) hours as needed for headache. 12/20/15 12/19/16  Burna Cash Rumley, DO  cloNIDine (CATAPRES) 0.1 MG tablet Take 1 tablet (0.1 mg total) by mouth 2 (two) times daily. 11/10/14   Clovis Fredrickson, MD  fluticasone (FLONASE) 50 MCG/ACT nasal spray Place 2 sprays into both nostrils daily. 09/29/15   Lind Covert, MD  lamoTRIgine (LAMICTAL) 100 MG tablet Take 100 mg by mouth daily.    Historical Provider, MD  lamoTRIgine (LAMICTAL) 25 MG tablet Take 100 mg by mouth at bedtime.    Historical Provider, MD  levothyroxine (SYNTHROID, LEVOTHROID) 75 MCG tablet TAKE (1) TABLET BY MOUTH ONCE DAILY BEFORE BREAKFAST. 09/08/15   Lind Covert, MD  lidocaine (XYLOCAINE) 5 % ointment APPLY (1) APPLICATION TOPICALLY AS NEEDED FOR LOWER BACK PAIN. 12/09/15   Lind Covert, MD  lithium carbonate (ESKALITH) 450 MG CR tablet Take 1 tablet (450 mg total) by mouth every 12 (twelve) hours. 11/10/14   Clovis Fredrickson, MD  Multiple Vitamins-Minerals (MULTIVITAMIN ADULTS 50+) TABS Take 1 tablet by mouth daily. 11/10/14   Clovis Fredrickson, MD  naproxen (NAPROSYN) 500 MG tablet Take 1 tablet (500 mg total) by mouth 2 (two) times daily with a meal. 01/04/16   Lysbeth Penner, FNP  naproxen (NAPROSYN) 500 MG tablet Take 1 tablet (  500 mg total) by mouth 2 (two) times daily with a meal. 01/04/16   Lysbeth Penner, FNP  OLANZapine (ZYPREXA) 20 MG tablet Take 1.5 tablets (30 mg total) by mouth at bedtime. Take 1 and 1/2 tablets by mouth at bedtime. Patient taking differently: Take 30 mg by mouth daily. Take 1 and 1/2 tablets by mouth at bedtime. 11/10/14   Clovis Fredrickson, MD  omeprazole (PRILOSEC) 10 MG capsule TAKE 1 CAPSULE BY MOUTH ONCE A DAY. 11/11/15   Lind Covert, MD  predniSONE (DELTASONE) 50  MG tablet Take 1 tablet (50 mg total) by mouth daily. 01/10/16   Kayla Rose, PA-C  PROAIR HFA 108 (90 Base) MCG/ACT inhaler INHALE 2 PUFFS INTO LUNGS EVERY 6 HOURS AS NEEDED FOR WHEEZING OR SHORTNESS OF BREATH. 11/11/15   Lind Covert, MD  traZODone (DESYREL) 150 MG tablet Take 1 tablet (150 mg total) by mouth at bedtime. 11/10/14   Clovis Fredrickson, MD  trolamine salicylate (ASPERCREME/ALOE) 10 % cream Apply 1 application topically 4 (four) times daily as needed for muscle pain. 12/02/14   Lind Covert, MD  Vitamin D, Ergocalciferol, (DRISDOL) 50000 UNITS CAPS capsule Take 1 capsule (50,000 Units total) by mouth every 14 (fourteen) days. 11/10/14   Clovis Fredrickson, MD    Family History History reviewed. No pertinent family history.  Social History Social History  Substance Use Topics  . Smoking status: Former Smoker    Packs/day: 0.00    Types: Cigarettes  . Smokeless tobacco: Not on file  . Alcohol use No     Allergies   Penicillins   Review of Systems Review of Systems  Constitutional: Negative for chills and fever.  HENT: Negative.  Negative for congestion.   Respiratory: Positive for cough and shortness of breath.   Cardiovascular: Negative.  Negative for chest pain.  Gastrointestinal: Negative.  Negative for abdominal pain and vomiting.  Musculoskeletal: Negative.  Negative for myalgias and neck pain.  Neurological: Negative.  Negative for syncope and weakness.     Physical Exam Updated Vital Signs BP 143/93 (BP Location: Left Arm)   Pulse 82   Temp 99.3 F (37.4 C) (Oral)   Resp (!) 28   SpO2 100%   Physical Exam  Constitutional: He appears well-developed and well-nourished.  HENT:  Head: Normocephalic.  Neck: Normal range of motion. Neck supple.  Cardiovascular: Normal rate and regular rhythm.   Pulmonary/Chest: Effort normal and breath sounds normal. He has no wheezes. He has no rales.  Actively coughing, dry, persistent cough.    Abdominal: Soft. Bowel sounds are normal. There is no tenderness. There is no rebound and no guarding.  Musculoskeletal: Normal range of motion.  Neurological: He is alert. No cranial nerve deficit.  Skin: Skin is warm and dry. No rash noted.  Psychiatric: He has a normal mood and affect.     ED Treatments / Results  Labs (all labs ordered are listed, but only abnormal results are displayed) Labs Reviewed - No data to display  EKG  EKG Interpretation None       Radiology No results found.  Procedures Procedures (including critical care time)  Medications Ordered in ED Medications  albuterol (PROVENTIL) (2.5 MG/3ML) 0.083% nebulizer solution 5 mg (not administered)  ipratropium (ATROVENT) nebulizer solution 0.5 mg (not administered)  predniSONE (DELTASONE) tablet 60 mg (not administered)  albuterol (PROVENTIL) (2.5 MG/3ML) 0.083% nebulizer solution 5 mg (5 mg Nebulization Given 01/18/16 1825)     Initial Impression /  Assessment and Plan / ED Course  I have reviewed the triage vital signs and the nursing notes.  Pertinent labs & imaging results that were available during my care of the patient were reviewed by me and considered in my medical decision making (see chart for details).  Clinical Course    Patient presents with complaint of asthma with symptom of constant cough only. He has used his inhaler without relief. No chest pain, fever. He was given 2 nebulizer treatments in the ED without change. No wheezing at any time. Normal O2 saturation.   Discussed a history of acid reflux or other stomach problems and patient's caregiver states he used to take medication for same. GI cocktail given with relief of cough. He feels improved enough to go home. He appears comfortable. Will discharge home on Prilosec.   Final Clinical Impressions(s) / ED Diagnoses   Final diagnoses:  None   1. Cough  New Prescriptions New Prescriptions   No medications on file     Charlann Lange, PA-C 01/19/16 [place], MD 01/19/16 [phone]

## 2016-01-19 ENCOUNTER — Other Ambulatory Visit: Payer: Self-pay | Admitting: Family Medicine

## 2016-01-19 MED ORDER — FAMOTIDINE 20 MG PO TABS: 20.0000 mg | ORAL_TABLET | Freq: Two times a day (BID) | ORAL | 0 refills | Status: DC

## 2016-01-19 MED ORDER — GI COCKTAIL ~~LOC~~
30.0000 mL | Freq: Once | ORAL | Status: AC
  Administered 2016-01-19: 30 mL via ORAL
  Filled 2016-01-19: qty 30

## 2016-01-19 MED ORDER — BENZONATATE 100 MG PO CAPS: 100.0000 mg | ORAL_CAPSULE | Freq: Three times a day (TID) | ORAL | 0 refills | Status: DC

## 2016-01-19 MED ORDER — OMEPRAZOLE 20 MG PO CPDR: 20.0000 mg | DELAYED_RELEASE_CAPSULE | Freq: Every day | ORAL | 0 refills | Status: DC

## 2016-01-19 NOTE — ED Notes (Signed)
Pt d/c home  

## 2016-01-24 ENCOUNTER — Ambulatory Visit (INDEPENDENT_AMBULATORY_CARE_PROVIDER_SITE_OTHER): Payer: Medicaid Other | Admitting: Family Medicine

## 2016-01-24 VITALS — BP 107/77 | HR 78 | Temp 98.1°F | Wt 174.8 lb

## 2016-01-24 DIAGNOSIS — R05 Cough: Secondary | ICD-10-CM | POA: Insufficient documentation

## 2016-01-24 DIAGNOSIS — R059 Cough, unspecified: Secondary | ICD-10-CM | POA: Insufficient documentation

## 2016-01-24 MED ORDER — GUAIFENESIN-DM 100-10 MG/5ML PO SYRP: 5.0000 mL | ORAL_SOLUTION | ORAL | 1 refills | Status: DC | PRN

## 2016-01-24 NOTE — Assessment & Plan Note (Addendum)
  Likely post-viral. Doubtful for asthma exacerbation as albuterol has not helped. Possibly has some component of allergies or reflux adding to cough. Not concerning for pneumonia or other infectious process.  -continue using tessalon three times a day -rx given for Robitussin DM to take 39ml 4 times a day -continue using Flonase -continue using Prilosec every morning -advised this may take 2-3 weeks to clear up -follow up w/ PCP if no improvement in 2-3 weeks for further evaluation

## 2016-01-24 NOTE — Patient Instructions (Signed)
  Please continue to take Tessalon Perles and Robitussin DM as well as use your Flonase.  This should clear up in 2-3 weeks. If you are still coughing, please come back to see Dr. Erin Hearing.  It was great meeting you today!

## 2016-01-24 NOTE — Progress Notes (Signed)
     Subjective:    Patient ID: Daniel Holmes, male    DOB: 1970-12-18, 45 y.o.   MRN: [id number]   CC: cough  Cough has been present for past 2 weeks. Has been to ED twice for cough, first time 8/14 and received burst of steroids that resolved cough for several days. Returned on 8/22 and was given tessalon and prescription for prilosec. Patient thinks this is asthma exacerbation. Was given neb in ED on 8/22 without relief of cough. Has been using albuterol inhaler two puffs twice a day with no relief. Cough is dry, non-productive, persistent throughout the day. Denies fever, sore throat, shortness of breath, wheezing. Does have runny nose and sneezing. 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   Smoking status reviewed- nonsmoker   Review of Systems- see HPI   Objective:  BP 107/77 (BP Location: Left Arm, Patient Position: Sitting, Cuff Size: Normal)   Pulse 78   Temp 98.1 F (36.7 C) (Oral)   Wt 174 lb 12.8 oz (79.3 kg)   SpO2 100%   BMI 28.21 kg/m  Vitals and nursing note reviewed  General: well nourished, in NAD HEENT: normocephalic, boggy erythematous nasal turbinates bilatearlly, moist mucous membranes, no erythema or exudate noted in posterior oropharynx Cardiac: RRR, clear S1 and S2, no murmurs, rubs, or gallops Respiratory: clear to auscultation bilaterally, no increased work of breathing Skin: warm and dry, no rashes noted   Assessment & Plan:    Cough  Likely post-viral. Doubtful for asthma exacerbation as albuterol has not helped. Possibly has some component of allergies or reflux adding to cough. Not concerning for pneumonia or other infectious process.  -continue using tessalon three times a day -rx given for Robitussin DM to take 10ml 4 times a day -continue using Flonase -continue using Prilosec every morning -advised this may take 2-3 weeks to clear up -follow up w/ PCP if no improvement in 2-3 weeks for further evaluation      Lucila Maine, DO Family Medicine Resident PGY-1

## 2016-01-31 ENCOUNTER — Encounter (HOSPITAL_COMMUNITY): Payer: Self-pay | Admitting: Emergency Medicine

## 2016-01-31 ENCOUNTER — Emergency Department (HOSPITAL_COMMUNITY)
Admission: EM | Admit: 2016-01-31 | Discharge: 2016-01-31 | Disposition: A | Discharge status: Home / Self Care | Payer: Medicaid Other | Attending: Emergency Medicine | Admitting: Emergency Medicine

## 2016-01-31 DIAGNOSIS — F909 Attention-deficit hyperactivity disorder, unspecified type: Secondary | ICD-10-CM | POA: Diagnosis not present

## 2016-01-31 DIAGNOSIS — E039 Hypothyroidism, unspecified: Secondary | ICD-10-CM | POA: Diagnosis not present

## 2016-01-31 DIAGNOSIS — F89 Unspecified disorder of psychological development: Secondary | ICD-10-CM | POA: Insufficient documentation

## 2016-01-31 DIAGNOSIS — J45909 Unspecified asthma, uncomplicated: Secondary | ICD-10-CM | POA: Insufficient documentation

## 2016-01-31 DIAGNOSIS — Z87891 Personal history of nicotine dependence: Secondary | ICD-10-CM | POA: Diagnosis not present

## 2016-01-31 DIAGNOSIS — G44219 Episodic tension-type headache, not intractable: Secondary | ICD-10-CM | POA: Insufficient documentation

## 2016-01-31 DIAGNOSIS — I10 Essential (primary) hypertension: Secondary | ICD-10-CM | POA: Insufficient documentation

## 2016-01-31 DIAGNOSIS — R51 Headache: Secondary | ICD-10-CM | POA: Diagnosis present

## 2016-01-31 LAB — COMPREHENSIVE METABOLIC PANEL
ALBUMIN: 4.2 g/dL (ref 3.5–5.0)
ALT: 20 U/L (ref 17–63)
AST: 22 U/L (ref 15–41)
Alkaline Phosphatase: 76 U/L (ref 38–126)
Anion gap: 5 (ref 5–15)
BUN: 6 mg/dL (ref 6–20)
CHLORIDE: 107 mmol/L (ref 101–111)
CO2: 24 mmol/L (ref 22–32)
Calcium: 9.7 mg/dL (ref 8.9–10.3)
Creatinine, Ser: 0.93 mg/dL (ref 0.61–1.24)
GFR calc Af Amer: >60 mL/min (ref >60)
GFR calc non Af Amer: >60 mL/min (ref >60)
GLUCOSE: 117 mg/dL — AB (ref 65–99)
POTASSIUM: 3.4 mmol/L — AB (ref 3.5–5.1)
SODIUM: 136 mmol/L (ref 135–145)
Total Bilirubin: 0.5 mg/dL (ref 0.3–1.2)
Total Protein: 6.3 g/dL — ABNORMAL LOW (ref 6.5–8.1)

## 2016-01-31 LAB — CBC WITH DIFFERENTIAL/PLATELET
BASOS ABS: 0 10*3/uL (ref 0.0–0.1)
BASOS PCT: 0 %
EOS PCT: 2 %
Eosinophils Absolute: 0.1 10*3/uL (ref 0.0–0.7)
HCT: 36.6 % — ABNORMAL LOW (ref 39.0–52.0)
Hemoglobin: 12.1 g/dL — ABNORMAL LOW (ref 13.0–17.0)
Lymphocytes Relative: 43 %
Lymphs Abs: 2.6 10*3/uL (ref 0.7–4.0)
MCH: 31 pg (ref 26.0–34.0)
MCHC: 33.1 g/dL (ref 30.0–36.0)
MCV: 93.8 fL (ref 78.0–100.0)
MONO ABS: 0.3 10*3/uL (ref 0.1–1.0)
Monocytes Relative: 5 %
Neutro Abs: 3.1 10*3/uL (ref 1.7–7.7)
Neutrophils Relative %: 50 %
PLATELETS: 168 10*3/uL (ref 150–400)
RBC: 3.9 MIL/uL — ABNORMAL LOW (ref 4.22–5.81)
RDW: 12.8 % (ref 11.5–15.5)
WBC: 6.1 10*3/uL (ref 4.0–10.5)

## 2016-01-31 LAB — URINALYSIS, ROUTINE W REFLEX MICROSCOPIC
Bilirubin Urine: NEGATIVE
GLUCOSE, UA: NEGATIVE mg/dL
HGB URINE DIPSTICK: NEGATIVE
Ketones, ur: NEGATIVE mg/dL
Leukocytes, UA: NEGATIVE
Nitrite: NEGATIVE
Protein, ur: NEGATIVE mg/dL
SPECIFIC GRAVITY, URINE: 1.016 (ref 1.005–1.030)
pH: 6.5 (ref 5.0–8.0)

## 2016-01-31 MED ORDER — ACETAMINOPHEN 500 MG PO TABS
1000.0000 mg | ORAL_TABLET | Freq: Once | ORAL | Status: AC
  Administered 2016-01-31: 1000 mg via ORAL
  Filled 2016-01-31: qty 2

## 2016-01-31 MED ORDER — IBUPROFEN 800 MG PO TABS
800.0000 mg | ORAL_TABLET | Freq: Once | ORAL | Status: AC
  Administered 2016-01-31: 800 mg via ORAL
  Filled 2016-01-31: qty 1

## 2016-01-31 NOTE — ED Provider Notes (Signed)
Emergency Department Provider Note   I have reviewed the triage vital signs and the nursing notes.   HISTORY  Chief Complaint Headache and Hypertension   HPI Daniel Holmes is a 45 y.o. male with PMH of developmental delay and asthma presents to the emergency department for evaluation of headache and sensation of elevated blood pressure. Patient states he does not have a blood pressure cuff at home but "felt like my blood pressure was up." Denies pain in his chest or difficulty breathing. He states that his headache began around the same time as his blood pressure sensation. He denies any sudden severe headache. No neck pain or stiffness. No fevers. Denies head trauma. He states he frequently gets headaches and this one does not feel new or different. He has not taken any over-the-counter medication to treat his symptoms.    Past Medical History:  Diagnosis Date  . Asthma   . Bipolar 1 disorder ([place])   . Mental developmental delay   . Psychosis    See Dr Laverta [place] [name]    Patient Active Problem List   Diagnosis Date Noted  . Cough 01/24/2016  . Encounter for long-term (current) use of other high-risk medications 09/29/2015  . Left leg swelling 07/14/2015  . Suicidal ideation 06/17/2015  . Bipolar 1 disorder ([place]) 11/08/2014  . 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 Bipolar disorder with depression ([place]) 11/03/2014  . Arthritis 11/03/2014  . 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 History of ADHD 11/03/2014  . Allergic rhinitis 11/05/2012  . Hypothyroidism 03/28/2012    Past Surgical History:  Procedure Laterality Date  . HERNIA REPAIR      Current Outpatient Rx  . Order #: [id number] Class: Normal  . Order #: [id number] Class: Print  . Order #: [id number] Class: Print  . Order #: [id number] Class: Print  . Order #: [id number] Class: Print  . Order #: [id number] Class: Normal  . Order #: [id number] Class: Normal  . Order #: [id number] Class:  Normal  . Order #: [id number] Class: Normal  . Order #: [id number] Class: Print  . Order #: [id number] Class: Print  . Order #: [id number] Class: Print  . Order #: [id number] Class: Normal  . Order #: [id number] Class: Print  . Order #: [id number] Class: Print  . Order #: [id number] Class: Print  . Order #: [id number] Class: Normal  . Order #: [id number] Class: Print  . Order #: [id number] Class: Normal  . Order #: [id number] Class: Print    Allergies Penicillins  No family history on file.  Social History Social History  Substance Use Topics  . Smoking status: Former Smoker    Packs/day: 0.00    Types: Cigarettes  . Smokeless tobacco: Never Used  . Alcohol use No    Review of Systems  Constitutional: No fever/chills Eyes: No visual changes. ENT: No sore throat. Cardiovascular: Denies chest pain. Respiratory: Denies shortness of breath. Gastrointestinal: No abdominal pain.  No nausea, no vomiting.  No diarrhea.  No constipation. Genitourinary: Negative for dysuria. Musculoskeletal: Negative for back pain. Skin: Negative for rash. Neurological: Negative for focal weakness or numbness. Positive HA.   10-point ROS otherwise negative.  ____________________________________________   PHYSICAL EXAM:  VITAL SIGNS: ED Triage Vitals  Enc Vitals Group     BP 01/31/16 2022 122/80     Pulse Rate 01/31/16 2022 72     Resp 01/31/16 2022 22     Temp 01/31/16 2022 98.2 F (36.8 C)     Temp Source 01/31/16 2022 Oral     SpO2 01/31/16 2022 100 %  Pain Score 01/31/16 2214 10   Constitutional: Alert and oriented. Well appearing and in no acute distress. Eyes: Conjunctivae are normal. PERRL. EOMI. Head: Atraumatic. Nose: No congestion/rhinnorhea. Mouth/Throat: Mucous membranes are moist.  Oropharynx non-erythematous. Neck: No stridor.  No meningeal signs. Cardiovascular: Normal rate, regular rhythm. Good peripheral circulation. Grossly normal heart sounds.   Respiratory: Normal respiratory  effort.  No retractions. Lungs CTAB. Gastrointestinal: Soft and nontender. No distention.  Musculoskeletal: No lower extremity tenderness nor edema. No gross deformities of extremities. Neurologic:  Normal speech and language. No gross focal neurologic deficits are appreciated.  Skin:  Skin is warm, dry and intact. No rash noted.  ____________________________________________   LABS (all labs ordered are listed, but only abnormal results are displayed)  Labs Reviewed  CBC WITH DIFFERENTIAL/PLATELET - Abnormal; Notable for the following:       Result Value   RBC 3.90 (*)    Hemoglobin 12.1 (*)    HCT 36.6 (*)    All other components within normal limits  COMPREHENSIVE METABOLIC PANEL - Abnormal; Notable for the following:    Potassium 3.4 (*)    Glucose, Bld 117 (*)    Total Protein 6.3 (*)    All other components within normal limits  URINALYSIS, ROUTINE W REFLEX MICROSCOPIC (NOT AT [facility])   ____________________________________________  RADIOLOGY  None ____________________________________________   PROCEDURES  Procedure(s) performed:   Procedures  None ____________________________________________   INITIAL IMPRESSION / ASSESSMENT AND PLAN / ED COURSE  Pertinent labs & imaging results that were available during my care of the patient were reviewed by me and considered in my medical decision making (see chart for details).  Patient resents emergency department for evaluation of frontal headache that came on gradually in the setting of a sensation of increased blood pressure. The patient did not take his blood pressure. States he is supposed be taking clonidine but ran out. Blood pressure in the emergency department is normal. No focal neurological deficits on my exam. No meningismus or other evidence of infectious etiology for headache or evidence to suggest intracranial hemorrhage. Will give Tylenol and Motrin for headache. Labs drawn in triage reviewed and are  normal.  11:05 PM  Patient is feeling much better after Tylenol and Motrin. Plan to discharge home with primary care physician follow-up to discuss blood pressure management. Patient is pleased at time of discharge.   At this time, I do not feel there is any life-threatening condition present. I have reviewed and discussed all results (EKG, imaging, lab, urine as appropriate), exam findings with patient. I have reviewed nursing notes and appropriate previous records.  I feel the patient is safe to be discharged home without further emergent workup. Discussed usual and customary return precautions. Patient and family (if present) verbalize understanding and are comfortable with this plan.  Patient will follow-up with their primary care provider. If they do not have a primary care provider, information for follow-up has been provided to them. All questions have been answered.    ____________________________________________  FINAL CLINICAL IMPRESSION(S) / ED DIAGNOSES  Final diagnoses:  Episodic tension-type headache, not intractable     MEDICATIONS GIVEN DURING THIS VISIT:  Medications  acetaminophen (TYLENOL) tablet 1,000 mg (1,000 mg Oral Given 01/31/16 2231)  ibuprofen (ADVIL,MOTRIN) tablet 800 mg (800 mg Oral Given 01/31/16 2231)     NEW OUTPATIENT MEDICATIONS STARTED DURING THIS VISIT:  None   Note:  This document was prepared using Dragon voice recognition software and may include unintentional dictation errors.  Nanda Quinton, MD Emergency Medicine   Margette Fast, MD 02/01/16 1150

## 2016-01-31 NOTE — Discharge Instructions (Signed)

## 2016-01-31 NOTE — ED Notes (Signed)
Called patient's caregiver [name]) [phone] as listed in Emergency Contacts. Patient had stated that caregiver dropped him off at the ED & left, and that he was unable to recall the phone number to reach him. CG reached. Stated he is in the parking deck & will come into the ED in a little while prior to discharge.

## 2016-01-31 NOTE — ED Triage Notes (Signed)
Pt. reports headache and elevated blood pressure today . Pt. added emesis today .

## 2016-01-31 NOTE — ED Notes (Signed)
Dr. Long at bedside at this time.  

## 2016-02-07 ENCOUNTER — Encounter: Payer: Self-pay | Admitting: Family Medicine

## 2016-02-07 ENCOUNTER — Ambulatory Visit (INDEPENDENT_AMBULATORY_CARE_PROVIDER_SITE_OTHER): Payer: Medicaid Other | Admitting: Family Medicine

## 2016-02-07 DIAGNOSIS — M1990 Unspecified osteoarthritis, unspecified site: Secondary | ICD-10-CM | POA: Diagnosis not present

## 2016-02-07 DIAGNOSIS — Z23 Encounter for immunization: Secondary | ICD-10-CM | POA: Diagnosis not present

## 2016-02-07 DIAGNOSIS — R059 Cough, unspecified: Secondary | ICD-10-CM

## 2016-02-07 DIAGNOSIS — R05 Cough: Secondary | ICD-10-CM | POA: Diagnosis not present

## 2016-02-07 NOTE — Assessment & Plan Note (Signed)
Stable continue current medications

## 2016-02-07 NOTE — Progress Notes (Signed)
Subjective  Patient is presenting with the following illnesses  Cough Follow Up Feels is much better.  Ran out of inhaler.  Using multiple antiacids and has a variety of as needed cough medications on his list.  No weight loss or fever or cough  Knee Pain Under good control.  Uses tylenol and aspercreme as needed.  No soft tissue swelling or redness or fever or rash   Chief Complaint noted Review of Symptoms - see HPI PMH - Smoking status noted.     Objective Vital Signs reviewed  Alert nad Lungs:  Normal respiratory effort, chest expands symmetrically. Lungs are clear to auscultation, no crackles or wheezes. Heart - Regular rate and rhythm.  No murmurs, gallops or rubs.    Moving around room without knee or ankle pain    Assessments/Plans  No problem-specific Assessment & Plan notes found for this encounter.   See Encounter view if individual problem A/Ps not visible See after visit summary for details of patient instuctions

## 2016-02-07 NOTE — Patient Instructions (Signed)
Good to see you today!  Thanks for coming in.  STOP these Medicines Famotidine Lidocaine cream Butal/Acet Proair Tussin

## 2016-02-07 NOTE — Assessment & Plan Note (Signed)
Much improved.  Resolved a large number of his medications

## 2016-03-01 ENCOUNTER — Telehealth: Payer: Self-pay | Admitting: Family Medicine

## 2016-03-01 NOTE — Telephone Encounter (Signed)
Forms were dropped off the week of September 18th, pt wants to know when the forms will be ready for pickup. Spoke with [place], will ask Dr. Erin Hearing if he has them. Please call pt tomorrow and let pt know. Thanks! ep

## 2016-03-01 NOTE — Telephone Encounter (Signed)
Pt called stating he gave someone some forms at his last visit on 9/11. Do you have these forms?? Or know what he is referring to? I didn't see anything in his chart.

## 2016-03-02 NOTE — Telephone Encounter (Signed)
Will forward to MD to advise. Jazmin Hartsell,CMA  

## 2016-03-08 NOTE — Telephone Encounter (Signed)
Letter in my box with a prescription for labs  Will draw these at his next visit

## 2016-03-15 ENCOUNTER — Encounter: Payer: Self-pay | Admitting: Internal Medicine

## 2016-03-15 ENCOUNTER — Ambulatory Visit (INDEPENDENT_AMBULATORY_CARE_PROVIDER_SITE_OTHER): Payer: Medicaid Other | Admitting: Internal Medicine

## 2016-03-15 VITALS — BP 115/77 | HR 81 | Temp 98.7°F | Wt 173.0 lb

## 2016-03-15 DIAGNOSIS — R05 Cough: Secondary | ICD-10-CM | POA: Diagnosis present

## 2016-03-15 DIAGNOSIS — R059 Cough, unspecified: Secondary | ICD-10-CM

## 2016-03-15 MED ORDER — DEXTROMETHORPHAN HBR 15 MG PO CAPS: 15.0000 mg | ORAL_CAPSULE | Freq: Every day | ORAL | 0 refills | Status: DC

## 2016-03-15 MED ORDER — ALBUTEROL SULFATE (2.5 MG/3ML) 0.083% IN NEBU
2.5000 mg | INHALATION_SOLUTION | Freq: Once | RESPIRATORY_TRACT | Status: AC
  Administered 2016-03-15: 2.5 mg via RESPIRATORY_TRACT

## 2016-03-15 MED ORDER — OMEPRAZOLE 20 MG PO CPDR: 20.0000 mg | DELAYED_RELEASE_CAPSULE | Freq: Every day | ORAL | 1 refills | Status: DC

## 2016-03-15 NOTE — Progress Notes (Signed)
   [facility] Phone: [phone]  Subjective:  Daniel Holmes is a 45 year old male presenting to clinic with cough. He has a history of multiple episodes of cough. He states his cough got better for a short time but then returned 2 weeks ago. He denies any runny nose or congestion. He denies any fevers or chills. He states that his primary care doctor recently stopped a lot of his medications. He states that his cough is a dry cough and is worse at night. He feels like mucus is stuck in his throat. He states that he sometimes gets short of breath if he is coughing a lot. He was previously taking Prilosec. Since stopping the Prilosec he has had increased burning in his throat. He states that his heartburn is very bothersome. He thinks he needs a breathing treatment to help with his cough, because he states this has helped in the past.  ROS: See HPI for pertinent positives and negatives  Past Medical History- allergic rhinitis, hypothyroidism, bipolar disorder with depression, mild intellectual disability, ADHD  Family history reviewed for today's visit. No changes.  Social history- patient is a former smoker  Objective: BP 115/77   Pulse 81   Temp 98.7 F (37.1 C) (Oral)   Wt 173 lb (78.5 kg)   SpO2 98%   BMI 27.92 kg/m  Gen: NAD, alert, cooperative with exam, dry cough intermittently throughout exam HEENT: NCAT, EOMI, MMM, oropharynx clear Neck: FROM, supple, no cervical lymphadenopathy CV: RRR, no murmur Resp: Exam very limited, as Pt states he is unable to take a deep breath. No wheezing or crackles auscultated. Normal work of breathing. Able to complete full sentences without becoming short of breath. Msk: Moves UE/LE spontaneously  Assessment/Plan: Cough: Patient presents with cough for the last 2 weeks. He has been seen multiple times in the past for cough. He thinks that the only thing that will help his cough is a nebulizer treatment. Could be secondary to  GERD. He notes worsening heartburn symptoms after stopping Prilosec. Respiratory exam is very limited, as patient states he is not able to take a deep breath. Viral URI unlikely, as he is not having any other symptoms. He may have a component of psychogenic cough. - Albuterol nebulizer given in clinic, with no improvement in cough or lung exam - Will restart Prilosec, as patient endorses worsening heartburn symptoms after stopping this medication. - Patient given prescription for Tussin #20, which he says has helped him a lot in the past - Return precautions discussed - Follow up with PCP if not improved   Hyman Bible, MD PGY-2

## 2016-03-15 NOTE — Patient Instructions (Signed)
It was so nice to meet you!  I have restarted your Prilosec since you are having a lot of heartburn.  I have also prescribed some Tussin to help with your cough at night.  If you are not getting better, please come back to see Dr. Erin Hearing.  -Dr. Brett Albino

## 2016-04-05 ENCOUNTER — Ambulatory Visit (INDEPENDENT_AMBULATORY_CARE_PROVIDER_SITE_OTHER): Payer: Medicaid Other | Admitting: Family Medicine

## 2016-04-05 ENCOUNTER — Encounter: Payer: Self-pay | Admitting: Family Medicine

## 2016-04-05 DIAGNOSIS — F319 Bipolar disorder, unspecified: Secondary | ICD-10-CM | POA: Diagnosis not present

## 2016-04-05 DIAGNOSIS — S90212A Contusion of left great toe with damage to nail, initial encounter: Secondary | ICD-10-CM | POA: Diagnosis not present

## 2016-04-05 NOTE — Assessment & Plan Note (Signed)
Patient noted to have some abnormal movements of his mouth, tongue, and frequent closing of his eyes.  These movements seem to be involuntary. I cannot tell if this is new or stable. I advised the patient to follow-up with his psychiatrist concerning these movements, as it could be secondary to his current medications.

## 2016-04-05 NOTE — Assessment & Plan Note (Signed)
Exam consistent with a subungual hematoma of the left great toe. He does not recall any trauma. No evidence of super imposed infection. We performed a gentle trimming today. There was a small amount of blood that was released from under the toenail, suggesting that this was a fairly recent injury.  The area was cleaned with bacitracin and a bandage was applied. -Discussed with patient that this would most likely fall off on its on. -Discussed return precautions which is redness, warmth, drainage, worsening pain, fevers, sore chills. -Advised the patient to avoid completely some urge and the toe for long periods of time.

## 2016-04-05 NOTE — Progress Notes (Signed)
To nail pain

## 2016-04-05 NOTE — Patient Instructions (Signed)
You were noted to have a small blood clot under your toenail, which probably came from some sort of trauma. We trimmed her toenail today. This allowed some of the blood to escape and will hopefully provide some relief of pain. Continue to keep the toenail clean. It will eventually fall off on its on. If you  note redness, warmth, drainage, or fevers or chills, please follow-up with [place]. Also follow-up with [place] if he notes worsening pain.  If your eyes and mouth movements or new as you say, please follow-up with your psychiatrist to prescribe sure antipsychotic medications, as I am concerned this is a side effect from that. You should follow-up with them as soon as possible.    Subungual Hematoma A subungual hematoma is a pocket of blood that collects under the fingernail or toenail. The pressure created by the blood under the nail can cause pain. CAUSES  A subungual hematoma occurs when an injury to the finger or toe causes a blood vessel beneath the nail to break. The injury can occur from a direct blow such as slamming a finger in a door. It can also occur from a repeated injury such as pressure on the foot in a shoe while running. A subungual hematoma is sometimes called runner's toe or tennis toe. SYMPTOMS   Blue or dark blue skin under the nail.  Pain or throbbing in the injured area. DIAGNOSIS  Your caregiver can determine whether you have a subungual hematoma based on your history and a physical exam. If your caregiver thinks you might have a broken (fractured) bone, X-rays may be taken. TREATMENT  Hematomas usually go away on their own over time. Your caregiver may make a hole in the nail to drain the blood. Draining the blood is painless and usually provides significant relief from pain and throbbing. The nail usually grows back normally after this procedure. In some cases, the nail may need to be removed. This is done if there is a cut under the nail that requires stitches  (sutures). HOME CARE INSTRUCTIONS   Put ice on the injured area.  Put ice in a plastic bag.  Place a towel between your skin and the bag.  Leave the ice on for 15-20 minutes, 03-04 times a day for the first 1 to 2 days.  Elevate the injured area to help decrease pain and swelling.  If you were given a bandage, wear it for as long as directed by your caregiver.  If part of your nail falls off, trim the remaining nail gently. This prevents the nail from catching on something and causing further injury.  Only take over-the-counter or prescription medicines for pain, discomfort, or fever as directed by your caregiver. SEEK IMMEDIATE MEDICAL CARE IF:   You have redness or swelling around the nail.  You have yellowish-white fluid (pus) coming from the nail.  Your pain is not controlled with medicine.  You have a fever. MAKE SURE YOU:  Understand these instructions.  Will watch your condition.  Will get help right away if you are not doing well or get worse.   This information is not intended to replace advice given to you by your health care provider. Make sure you discuss any questions you have with your health care provider.   Document Released: 05/12/2000 Document Revised: 08/07/2011 Document Reviewed: 09/30/2014 Elsevier Interactive Patient Education [employer].

## 2016-04-05 NOTE — Progress Notes (Signed)
    Subjective: CC: ingrown toenail   HPI: Patient is a 45 y.o. male with a past medical history of bipolar 1 disorder, mild intellectual disability, and speech articulation disorder presenting to clinic today for a same-day appointment due to concerns of an ingrown toe nail.  Yesterday the patient noted an abnormality of his left great toenail. He noted that it was black/purple when he was showering yesterday. He notes it hurts when he squeezes his toe, he denies pain with shoes on or with ambulation.   He denies any redness, swelling, or warmth of the toenail or surrounding area. He does not recall any trauma to the area. He denies any new footwear.  Social History: non-smoker  Health Maintenance: up to date   ROS: All other systems reviewed and are negative.  Past Medical History Patient Active Problem List   Diagnosis Date Noted  . Subungual hematoma of great toe of left foot 04/05/2016  . Cough 01/24/2016  . Encounter for long-term (current) use of other high-risk medications 09/29/2015  . Bipolar 1 disorder ([place]) 11/08/2014  . 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 Bipolar disorder with depression ([place]) 11/03/2014  . Arthritis 11/03/2014  . 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 History of ADHD 11/03/2014  . Allergic rhinitis 11/05/2012  . Hypothyroidism 03/28/2012    Medications- reviewed and updated  Objective: Office vital signs reviewed. BP 128/84   Pulse 89   Temp 97.9 F (36.6 C) (Oral)   Ht 5\' 6"  (1.676 m)   Wt 172 lb 3.2 oz (78.1 kg)   BMI 27.79 kg/m    Physical Examination:  General: Awake, alert, well- nourished, NAD ENMT:  Intermittent random movements of his mouth/tongue and closing of his eyes.  Left foot: violaceous/black discoloration at the base of the left great toenail. Small area of pink noted at the medial/distal portion of the nail suggestive of some attachment to the nail bed. No erythema, warmth, drainage noted.  Toe nail trimmed  down on the lateral aspect (where nail was not attached to the nail bed). Small amount of blood was released. Area was hemostatic. Bacitracin applied to the area. Wrapped.   Assessment/Plan: Subungual hematoma of great toe of left foot Exam consistent with a subungual hematoma of the left great toe. He does not recall any trauma. No evidence of super imposed infection. We performed a gentle trimming today. There was a small amount of blood that was released from under the toenail, suggesting that this was a fairly recent injury.  The area was cleaned with bacitracin and a bandage was applied. -Discussed with patient that this would most likely fall off on its on. -Discussed return precautions which is redness, warmth, drainage, worsening pain, fevers, sore chills. -Advised the patient to avoid completely some urge and the toe for long periods of time.  Bipolar 1 disorder Patient noted to have some abnormal movements of his mouth, tongue, and frequent closing of his eyes.  These movements seem to be involuntary. I cannot tell if this is new or stable. I advised the patient to follow-up with his psychiatrist concerning these movements, as it could be secondary to his current medications.   No orders of the defined types were placed in this encounter.   No orders of the defined types were placed in this encounter.   Archie Patten PGY-3, [place]

## 2016-04-10 ENCOUNTER — Other Ambulatory Visit: Payer: Self-pay | Admitting: Family Medicine

## 2016-05-09 ENCOUNTER — Other Ambulatory Visit: Payer: Self-pay | Admitting: Internal Medicine

## 2016-05-26 ENCOUNTER — Ambulatory Visit: Payer: Self-pay | Admitting: Family Medicine

## 2016-06-22 ENCOUNTER — Other Ambulatory Visit: Payer: Self-pay | Admitting: Family Medicine

## 2016-07-06 ENCOUNTER — Telehealth: Payer: Self-pay | Admitting: Family Medicine

## 2016-07-06 NOTE — Telephone Encounter (Signed)
Pt needs MAR to change with [facility], so caregiver can give Tylenol. Needs to say pt can be given 500mg  tylenol for headaches, knee pain, and back pain.  Fax [phone]. ep

## 2016-07-07 ENCOUNTER — Encounter: Payer: Self-pay | Admitting: Family Medicine

## 2016-07-07 NOTE — Progress Notes (Signed)
w

## 2016-08-09 ENCOUNTER — Telehealth: Payer: Self-pay | Admitting: Family Medicine

## 2016-08-09 NOTE — Telephone Encounter (Signed)
I don't see a recent copy in patients chart, will forward to PCP.

## 2016-08-09 NOTE — Telephone Encounter (Signed)
Caregiver needs recent [facility] faxed to [place]. [phone] ep

## 2016-08-09 NOTE — Telephone Encounter (Signed)
Please let them know  I do not have a copy of an FL2 and do not recall filling out one recently  I will be happy to sign if they send one to me

## 2016-08-10 ENCOUNTER — Telehealth: Payer: Self-pay | Admitting: Family Medicine

## 2016-08-10 NOTE — Telephone Encounter (Signed)
FL2 form dropped off for at front desk for completion.  Verified that patient section of form has been completed.  Last DOS/WCC was 04/05/16 .  Placed form in team folder to be completed by clinical staff.  Crista Luria

## 2016-08-11 ENCOUNTER — Encounter: Payer: Self-pay | Admitting: Family Medicine

## 2016-08-11 NOTE — Telephone Encounter (Signed)
Form placed in PCP box 

## 2016-08-11 NOTE — Telephone Encounter (Signed)
Patient's caregiver informed that FL2 form was completed and faxed to [phone] per request.  Original copy placed up front for pickup.

## 2016-08-14 NOTE — Telephone Encounter (Signed)
FL2 shows it was marked home instead o domiciliary. Please correct and fax to [phone]

## 2016-08-15 ENCOUNTER — Telehealth: Payer: Self-pay | Admitting: Family Medicine

## 2016-08-15 NOTE — Telephone Encounter (Signed)
Caregiver is calling because the FL2 forms need to be re-done. The doctor marked that he lived in a home instead of domiciliary. [facility] will not except this, They want a new form filled out. He said that they also mentioned questions 11,12,14. Please get this filled out ASAP and fax the corrected form to [phone].Marland Kitchen jw

## 2016-08-15 NOTE — Telephone Encounter (Signed)
New FL2 form placed in PCP box.

## 2016-08-15 NOTE — Telephone Encounter (Signed)
Please let them know it is customary that the facility fill out the [place] and send to me for review and signature.  If they wish it filled out as they wish they should do this OR make an appointment with me where we can fill out together.  It is not appropriate for them to drop off a blank form.   Thanks  [place]

## 2016-08-16 ENCOUNTER — Telehealth: Payer: Self-pay | Admitting: Licensed Clinical Social Worker

## 2016-08-16 ENCOUNTER — Encounter: Payer: Self-pay | Admitting: Licensed Clinical Social Worker

## 2016-08-16 NOTE — Progress Notes (Signed)
Request to assist with FL2 for patient.  Called patient's care taker  Marice to obtain information needed for FL2.   FL2 completed and forward to PCP for Co-sign via in-basket.  Casimer Lanius, LCSW Licensed Clinical Social Worker [place]   [phone] 9:10 AM

## 2016-08-16 NOTE — NC FL2 (Signed)
[place] LEVEL OF CARE SCREENING TOOL     IDENTIFICATION  Patient Name: Daniel Holmes Birthdate: Feb 13, 1971 Sex: male Admission Date (Current Location): (Not on file)  [place] and [place] Number:  Daniel Holmes [id number] Facility and Address:   ([facility] [address], [place] [address])      Provider Number:    Attending Physician Name and Address:  [place] [address], [place] [address])   Relative Name and Phone Number:       Current Level of Care: Domiciliary (Rest home) Recommended Level of Care: Other (Comment) (Domiciliary) Prior Approval Number:    Date Approved/Denied:   PASRR Number:    Discharge Plan:      Current Diagnoses: Patient Active Problem List   Diagnosis Date Noted  . Subungual hematoma of great toe of left foot 04/05/2016  . Cough 01/24/2016  . Encounter for long-term (current) use of other high-risk medications 09/29/2015  . Bipolar 1 disorder ([place]) 11/08/2014  . 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 Bipolar disorder with depression ([place]) 11/03/2014  . Arthritis 11/03/2014  . 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 History of ADHD 11/03/2014  . Allergic rhinitis 11/05/2012  . Hypothyroidism 03/28/2012    Orientation RESPIRATION BLADDER Height & Weight     Self, Time, Situation, Place  Normal Continent Weight:   Height:     BEHAVIORAL SYMPTOMS/MOOD NEUROLOGICAL BOWEL NUTRITION STATUS      Continent Diet (Regular)  AMBULATORY STATUS COMMUNICATION OF NEEDS Skin   Independent Verbally Normal                       Personal Care Assistance Level of Assistance              Functional Limitations Info             SPECIAL CARE FACTORS FREQUENCY                       Contractures      Additional Factors Info  Allergies, Psychotropic   Allergies Info: PenicillinsAnaphylaxis Psychotropic Info: see Med list          Current Medications (08/16/2016):  This is the current hospital active medication list Current Outpatient Prescriptions  Medication Sig Dispense Refill  . cloNIDine (CATAPRES) 0.1 MG tablet Take 1 tablet (0.1 mg total) by mouth 2 (two) times daily. 60 tablet 0  . Dextromethorphan HBr (TUSSIN COUGH) 15 MG CAPS Take 1 capsule (15 mg total) by mouth at bedtime. 20 each 0  . fluticasone (FLONASE) 50 MCG/ACT nasal spray Place 2 sprays into both nostrils daily. 16 g 6  . lamoTRIgine (LAMICTAL) 100 MG tablet Take 100 mg by mouth daily.    Marland Kitchen levothyroxine (SYNTHROID, LEVOTHROID) 75 MCG tablet TAKE (1) TABLET BY MOUTH ONCE DAILY BEFORE BREAKFAST. 90 tablet 1  . lidocaine (XYLOCAINE) 5 % ointment APPLY (1) APPLICATION TOPICALLY AS NEEDED FOR LOWER BACK PAIN. (Patient not taking: Reported on 01/31/2016) 35.44 g 0  . lithium carbonate (ESKALITH) 450 MG CR tablet Take 1 tablet (450 mg total) by mouth every 12 (twelve) hours. 60 tablet 0  . MAPAP 500 MG tablet TAKE 2 TABLETS BY MOUTH TWICE DAILY AS NEEDED FOR KNEE PAIN. 60 tablet 0  . Multiple Vitamins-Minerals (MULTIVITAMIN ADULTS 50+) TABS Take 1 tablet by mouth daily. 30 tablet 0  . naproxen (NAPROSYN) 500 MG tablet Take 1  tablet (500 mg total) by mouth 2 (two) times daily with a meal. (Patient not taking: Reported on 01/31/2016) 14 tablet 0  . OLANZapine (ZYPREXA) 20 MG tablet Take 1.5 tablets (30 mg total) by mouth at bedtime. Take 1 and 1/2 tablets by mouth at bedtime. 45 tablet 0  . Olopatadine HCl (PAZEO) 0.7 % SOLN Apply to eye.    Marland Kitchen omeprazole (PRILOSEC) 20 MG capsule Take 1 capsule (20 mg total) by mouth daily as needed. 20 capsule 3  . traZODone (DESYREL) 150 MG tablet Take 1 tablet (150 mg total) by mouth at bedtime. 30 tablet 0  . trolamine salicylate (ASPERCREME/ALOE) 10 % cream Apply 1 application topically 4 (four) times daily as needed for muscle pain. 85 g 3  . Vitamin D, Ergocalciferol, (DRISDOL) 50000 UNITS CAPS capsule Take 1 capsule (50,000  Units total) by mouth every 14 (fourteen) days. 2 capsule 0   No current facility-administered medications for this visit.     Discharge Medications: Please see discharge summary for a list of discharge medications.  Relevant Imaging Results:  Relevant Lab Results:   Additional Information   Maurine Cane, LCSW

## 2016-08-16 NOTE — NC FL2 (Signed)
[place] LEVEL OF CARE SCREENING TOOL     IDENTIFICATION  Patient Name: Daniel Holmes Birthdate: 1971-03-01 Sex: male Admission Date (Current Location): (Not on file)  [place] and [place] Number:    Kathleen Argue [id number] Facility and Address:   [facility]      Provider Number:    Attending Physician Name and Address:  [place]. [address] [place] [address]) Relative Name and Phone Number:       Current Level of Care:   Domiciliary (Rest home Recommended Level of Care:   Domiciliary (Rest home Prior Approval Number:    Date Approved/Denied:   PASRR Number:    Discharge Plan:      Current Diagnoses: Patient Active Problem List   Diagnosis Date Noted  . Subungual hematoma of great toe of left foot 04/05/2016  . Cough 01/24/2016  . Encounter for long-term (current) use of other high-risk medications 09/29/2015  . Bipolar 1 disorder ([place]) 11/08/2014  . 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 Bipolar disorder with depression ([place]) 11/03/2014  . Arthritis 11/03/2014  . 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 History of ADHD 11/03/2014  . Allergic rhinitis 11/05/2012  . Hypothyroidism 03/28/2012    Orientation RESPIRATION BLADDER Height & Weight     Self, Time, Situation, Place      Normal   Continent Weight:   Height:     BEHAVIORAL SYMPTOMS/MOOD NEUROLOGICAL BOWEL Continent NUTRITION STATUS Diet (Regular)           AMBULATORY STATUS COMMUNICATION OF NEEDS Skin     Independent   Verbally   Normal                       Personal Care Assistance Level of Assistance              Functional Limitations Info             SPECIAL CARE FACTORS FREQUENCY                       Contractures      Additional Factors Info    Allergies, Psychotropic   Allergies Info: PenicillinsAnaphylaxis Psychotropic Info: see Med list             Current  Medications (08/16/2016):   Current Outpatient Prescriptions  Medication Sig Dispense Refill  . cloNIDine (CATAPRES) 0.1 MG tablet Take 1 tablet (0.1 mg total) by mouth 2 (two) times daily. 60 tablet 0  . Dextromethorphan HBr (TUSSIN COUGH) 15 MG CAPS Take 1 capsule (15 mg total) by mouth at bedtime. 20 each 0  . fluticasone (FLONASE) 50 MCG/ACT nasal spray Place 2 sprays into both nostrils daily. 16 g 6  . lamoTRIgine (LAMICTAL) 100 MG tablet Take 100 mg by mouth daily.    Marland Kitchen levothyroxine (SYNTHROID, LEVOTHROID) 75 MCG tablet TAKE (1) TABLET BY MOUTH ONCE DAILY BEFORE BREAKFAST. 90 tablet 1  . lidocaine (XYLOCAINE) 5 % ointment APPLY (1) APPLICATION TOPICALLY AS NEEDED FOR LOWER BACK PAIN. (Patient not taking: Reported on 01/31/2016) 35.44 g 0  . lithium carbonate (ESKALITH) 450 MG CR tablet Take 1 tablet (450 mg total) by mouth every 12 (twelve) hours. 60 tablet 0  . MAPAP 500 MG tablet TAKE 2 TABLETS BY MOUTH TWICE DAILY AS NEEDED FOR KNEE PAIN. 60 tablet 0  . Multiple Vitamins-Minerals (MULTIVITAMIN ADULTS 50+) TABS Take 1 tablet by mouth daily.  30 tablet 0  . naproxen (NAPROSYN) 500 MG tablet Take 1 tablet (500 mg total) by mouth 2 (two) times daily with a meal. (Patient not taking: Reported on 01/31/2016) 14 tablet 0  . OLANZapine (ZYPREXA) 20 MG tablet Take 1.5 tablets (30 mg total) by mouth at bedtime. Take 1 and 1/2 tablets by mouth at bedtime. 45 tablet 0  . Olopatadine HCl (PAZEO) 0.7 % SOLN Apply to eye.    Marland Kitchen omeprazole (PRILOSEC) 20 MG capsule Take 1 capsule (20 mg total) by mouth daily as needed. 20 capsule 3  . traZODone (DESYREL) 150 MG tablet Take 1 tablet (150 mg total) by mouth at bedtime. 30 tablet 0  . trolamine salicylate (ASPERCREME/ALOE) 10 % cream Apply 1 application topically 4 (four) times daily as needed for muscle pain. 85 g 3  . Vitamin D, Ergocalciferol, (DRISDOL) 50000 UNITS CAPS capsule Take 1 capsule (50,000 Units total) by mouth every 14 (fourteen) days. 2 capsule 0    No current facility-administered medications for this visit.    Discharge Medications:   Relevant Imaging Results:  Relevant Lab Results:   Additional Information    Maurine Cane, LCSW

## 2016-08-16 NOTE — Telephone Encounter (Signed)
FL2 co-signed by Dr. Erin Hearing.  Called [facility] [phone], spoke to Ms. Dee to verify fax number.  FL2 faxed to [phone] via [employer].  Received phone call from Ms. Saralyn Pilar to verify receipt of FL2.  Casimer Lanius, LCSW Licensed Clinical Social Worker [place]   [phone] 2:16 PM

## 2016-09-04 ENCOUNTER — Emergency Department (HOSPITAL_COMMUNITY): Payer: Medicaid Other

## 2016-09-04 ENCOUNTER — Other Ambulatory Visit: Payer: Self-pay

## 2016-09-04 ENCOUNTER — Encounter (HOSPITAL_COMMUNITY): Payer: Self-pay | Admitting: Emergency Medicine

## 2016-09-04 DIAGNOSIS — Z87891 Personal history of nicotine dependence: Secondary | ICD-10-CM | POA: Insufficient documentation

## 2016-09-04 DIAGNOSIS — E039 Hypothyroidism, unspecified: Secondary | ICD-10-CM | POA: Diagnosis not present

## 2016-09-04 DIAGNOSIS — J45909 Unspecified asthma, uncomplicated: Secondary | ICD-10-CM | POA: Diagnosis not present

## 2016-09-04 DIAGNOSIS — F909 Attention-deficit hyperactivity disorder, unspecified type: Secondary | ICD-10-CM | POA: Diagnosis not present

## 2016-09-04 DIAGNOSIS — K219 Gastro-esophageal reflux disease without esophagitis: Secondary | ICD-10-CM | POA: Insufficient documentation

## 2016-09-04 DIAGNOSIS — Z79899 Other long term (current) drug therapy: Secondary | ICD-10-CM | POA: Diagnosis not present

## 2016-09-04 DIAGNOSIS — R072 Precordial pain: Secondary | ICD-10-CM | POA: Diagnosis present

## 2016-09-04 LAB — I-STAT TROPONIN, ED: TROPONIN I, POC: 0 ng/mL (ref 0.00–0.08)

## 2016-09-04 LAB — CBC
HEMATOCRIT: 37.6 % — AB (ref 39.0–52.0)
Hemoglobin: 12.8 g/dL — ABNORMAL LOW (ref 13.0–17.0)
MCH: 30.8 pg (ref 26.0–34.0)
MCHC: 34 g/dL (ref 30.0–36.0)
MCV: 90.4 fL (ref 78.0–100.0)
Platelets: 188 10*3/uL (ref 150–400)
RBC: 4.16 MIL/uL — AB (ref 4.22–5.81)
RDW: 12.7 % (ref 11.5–15.5)
WBC: 7.4 10*3/uL (ref 4.0–10.5)

## 2016-09-04 LAB — BASIC METABOLIC PANEL
ANION GAP: 7 (ref 5–15)
BUN: 7 mg/dL (ref 6–20)
CO2: 26 mmol/L (ref 22–32)
Calcium: 10 mg/dL (ref 8.9–10.3)
Chloride: 105 mmol/L (ref 101–111)
Creatinine, Ser: 0.93 mg/dL (ref 0.61–1.24)
GLUCOSE: 111 mg/dL — AB (ref 65–99)
POTASSIUM: 4.5 mmol/L (ref 3.5–5.1)
Sodium: 138 mmol/L (ref 135–145)

## 2016-09-04 NOTE — ED Notes (Signed)
Patient asked to speak to nurse.  Updated on delays, reassured from current results, and explained reasons for needing to wait to speak to a doctor.  Patient reassured and thanked Therapist, sports.

## 2016-09-04 NOTE — ED Triage Notes (Signed)
Pt states he has been having CP today. Denies any other symptoms.

## 2016-09-05 ENCOUNTER — Emergency Department (HOSPITAL_COMMUNITY)
Admission: EM | Admit: 2016-09-05 | Discharge: 2016-09-05 | Disposition: A | Discharge status: Home / Self Care | Payer: Medicaid Other | Attending: Emergency Medicine | Admitting: Emergency Medicine

## 2016-09-05 DIAGNOSIS — R072 Precordial pain: Secondary | ICD-10-CM

## 2016-09-05 DIAGNOSIS — K219 Gastro-esophageal reflux disease without esophagitis: Secondary | ICD-10-CM

## 2016-09-05 MED ORDER — ALBUTEROL SULFATE HFA 108 (90 BASE) MCG/ACT IN AERS
2.0000 | INHALATION_SPRAY | Freq: Once | RESPIRATORY_TRACT | Status: AC
  Administered 2016-09-05: 2 via RESPIRATORY_TRACT
  Filled 2016-09-05: qty 6.7

## 2016-09-05 MED ORDER — OMEPRAZOLE 20 MG PO CPDR: 20.0000 mg | DELAYED_RELEASE_CAPSULE | Freq: Every day | ORAL | 0 refills | Status: DC

## 2016-09-05 MED ORDER — GI COCKTAIL ~~LOC~~
30.0000 mL | Freq: Once | ORAL | Status: AC
  Administered 2016-09-05: 30 mL via ORAL
  Filled 2016-09-05: qty 30

## 2016-09-05 MED ORDER — ACETAMINOPHEN 325 MG PO TABS
650.0000 mg | ORAL_TABLET | Freq: Once | ORAL | Status: AC
  Administered 2016-09-05: 650 mg via ORAL
  Filled 2016-09-05: qty 2

## 2016-09-05 NOTE — ED Provider Notes (Signed)
[facility] DEPT Provider Note   CSN: [id number] Arrival date & time: 09/04/16  1714  By signing my name below, I, Arianna Nassar, attest that this documentation has been prepared under the direction and in the presence of Jola Schmidt, MD.  Electronically Signed: Julien Nordmann, ED Scribe. 09/05/16. 1:11 AM.    History   Chief Complaint Chief Complaint  Patient presents with  . Chest Pain   The history is provided by the patient. No language interpreter was used.   HPI Comments: Daniel Holmes is a 46 y.o. male who has a PMhx of asthma, bipolar 1 and mental developmental delay, and psychosis presents to the Emergency Department complaining of moderate, persistently worsening, non-radiating substernal chest pain that began today. He reports associated cough secondary to his allergies. He says his chest pain is post-prandial. Pt says he took tylenol earlier to alleviate his pain without relief. Pt denies loss of appetite or dysgeusias.   Past Medical History:  Diagnosis Date  . Asthma   . Bipolar 1 disorder ([place])   . Mental developmental delay   . Psychosis    See Dr Laverta [place] [name]    Patient Active Problem List   Diagnosis Date Noted  . Subungual hematoma of great toe of left foot 04/05/2016  . Cough 01/24/2016  . Encounter for long-term (current) use of other high-risk medications 09/29/2015  . Bipolar 1 disorder ([place]) 11/08/2014  . 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 Bipolar disorder with depression ([place]) 11/03/2014  . Arthritis 11/03/2014  . 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 History of ADHD 11/03/2014  . Allergic rhinitis 11/05/2012  . Hypothyroidism 03/28/2012    Past Surgical History:  Procedure Laterality Date  . HERNIA REPAIR         Home Medications    Prior to Admission medications   Medication Sig Start Date End Date Taking? Authorizing Provider  cloNIDine (CATAPRES) 0.1 MG tablet Take 1 tablet (0.1 mg  total) by mouth 2 (two) times daily. 11/10/14   Clovis Fredrickson, MD  Dextromethorphan HBr (TUSSIN COUGH) 15 MG CAPS Take 1 capsule (15 mg total) by mouth at bedtime. 03/15/16   Sela Hua, MD  fluticasone (FLONASE) 50 MCG/ACT nasal spray Place 2 sprays into both nostrils daily. 09/29/15   Lind Covert, MD  lamoTRIgine (LAMICTAL) 100 MG tablet Take 100 mg by mouth daily.    Historical Provider, MD  levothyroxine (SYNTHROID, LEVOTHROID) 75 MCG tablet TAKE (1) TABLET BY MOUTH ONCE DAILY BEFORE BREAKFAST. 04/10/16   Lind Covert, MD  lidocaine (XYLOCAINE) 5 % ointment APPLY (1) APPLICATION TOPICALLY AS NEEDED FOR LOWER BACK PAIN. Patient not taking: Reported on 01/31/2016 12/09/15   Lind Covert, MD  lithium carbonate (ESKALITH) 450 MG CR tablet Take 1 tablet (450 mg total) by mouth every 12 (twelve) hours. 11/10/14   Clovis Fredrickson, MD  MAPAP 500 MG tablet TAKE 2 TABLETS BY MOUTH TWICE DAILY AS NEEDED FOR KNEE PAIN. 06/23/16   Lind Covert, MD  Multiple Vitamins-Minerals (MULTIVITAMIN ADULTS 50+) TABS Take 1 tablet by mouth daily. 11/10/14   Clovis Fredrickson, MD  naproxen (NAPROSYN) 500 MG tablet Take 1 tablet (500 mg total) by mouth 2 (two) times daily with a meal. Patient not taking: Reported on 01/31/2016 01/04/16   Lysbeth Penner, FNP  OLANZapine (ZYPREXA) 20 MG tablet Take 1.5 tablets (30 mg total) by mouth at bedtime. Take 1 and 1/2 tablets by mouth at bedtime. 11/10/14  Clovis Fredrickson, MD  Olopatadine HCl (PAZEO) 0.7 % SOLN Apply to eye.    Historical Provider, MD  omeprazole (PRILOSEC) 20 MG capsule Take 1 capsule (20 mg total) by mouth daily as needed. 05/09/16   Lind Covert, MD  traZODone (DESYREL) 150 MG tablet Take 1 tablet (150 mg total) by mouth at bedtime. 11/10/14   Clovis Fredrickson, MD  trolamine salicylate (ASPERCREME/ALOE) 10 % cream Apply 1 application topically 4 (four) times daily as needed for muscle pain. 12/02/14   Lind Covert, MD  Vitamin D, Ergocalciferol, (DRISDOL) 50000 UNITS CAPS capsule Take 1 capsule (50,000 Units total) by mouth every 14 (fourteen) days. 11/10/14   Clovis Fredrickson, MD    Family History History reviewed. No pertinent family history.  Social History Social History  Substance Use Topics  . Smoking status: Former Smoker    Packs/day: 0.00    Types: Cigarettes  . Smokeless tobacco: Never Used  . Alcohol use No     Allergies   Penicillins   Review of Systems Review of Systems  A complete 10 system review of systems was obtained and all systems are negative except as noted in the HPI and PMH.   Physical Exam Updated Vital Signs BP 116/82   Pulse 73   Temp 98.5 F (36.9 C) (Oral)   Resp 16   SpO2 99%   Physical Exam  Constitutional: He is oriented to person, place, and time. He appears well-developed and well-nourished.  HENT:  Head: Normocephalic and atraumatic.  Eyes: EOM are normal.  Neck: Normal range of motion.  Cardiovascular: Normal rate, regular rhythm, normal heart sounds and intact distal pulses.   Pulmonary/Chest: Effort normal and breath sounds normal. No respiratory distress.  Abdominal: Soft. He exhibits no distension. There is no tenderness.  Musculoskeletal: Normal range of motion.  Neurological: He is alert and oriented to person, place, and time.  Skin: Skin is warm and dry.  Psychiatric: He has a normal mood and affect. Judgment normal.  Nursing note and vitals reviewed.    ED Treatments / Results  DIAGNOSTIC STUDIES: Oxygen Saturation is 99% on RA, normal by my interpretation.  COORDINATION OF CARE:  12:34 AM Discussed treatment plan with pt at bedside and pt agreed to plan.  Labs (all labs ordered are listed, but only abnormal results are displayed) Labs Reviewed  BASIC METABOLIC PANEL - Abnormal; Notable for the following:       Result Value   Glucose, Bld 111 (*)    All other components within normal limits  CBC -  Abnormal; Notable for the following:    RBC 4.16 (*)    Hemoglobin 12.8 (*)    HCT 37.6 (*)    All other components within normal limits  I-STAT TROPOININ, ED    EKG  EKG Interpretation  Date/Time:  Monday September 04 2016 17:19:32 EDT Ventricular Rate:  74 PR Interval:  126 QRS Duration: 92 QT Interval:  366 QTC Calculation: 406 R Axis:   53 Text Interpretation:  Normal sinus rhythm Normal ECG No significant change was found Confirmed by Zuleica Seith  MD, Lennette Bihari ([id number]) on 09/05/2016 12:18:57 AM       Radiology Dg Chest 2 View  Result Date: 09/04/2016 CLINICAL DATA:  Chest pain today. EXAM: CHEST  2 VIEW COMPARISON:  Chest radiograph August 14, 2015 and priors FINDINGS: Cardiomediastinal silhouette is normal. No pleural effusions or focal consolidations. Similar bibasilar interstitial prominence. Trachea projects midline and there is no  pneumothorax. Soft tissue planes and included osseous structures are non-suspicious. IMPRESSION: Stable examination:  Bibasilar atelectasis/scarring. Electronically Signed   By: Elon Alas M.D.   On: 09/04/2016 18:14    Procedures Procedures (including critical care time)  Medications Ordered in ED Medications - No data to display   Initial Impression / Assessment and Plan / ED Course  I have reviewed the triage vital signs and the nursing notes.  Pertinent labs & imaging results that were available during my care of the patient were reviewed by me and considered in my medical decision making (see chart for details).     Patient is overall well-appearing.  He feels much better after GI cocktail.  Cough is also improved after albuterol.  Patient be discharged home with Prilosec.  Suspect gastroesophageal reflux disease.  Final Clinical Impressions(s) / ED Diagnoses   Final diagnoses:  None   I personally performed the services described in this documentation, which was scribed in my presence. The recorded information has been reviewed and  is accurate.     New Prescriptions New Prescriptions   No medications on file     Jola Schmidt, MD 09/05/16 [phone]

## 2016-09-13 ENCOUNTER — Ambulatory Visit (INDEPENDENT_AMBULATORY_CARE_PROVIDER_SITE_OTHER): Payer: Medicaid Other | Admitting: Family Medicine

## 2016-09-13 ENCOUNTER — Encounter: Payer: Self-pay | Admitting: Family Medicine

## 2016-09-13 DIAGNOSIS — R05 Cough: Secondary | ICD-10-CM | POA: Diagnosis not present

## 2016-09-13 DIAGNOSIS — J3089 Other allergic rhinitis: Secondary | ICD-10-CM

## 2016-09-13 DIAGNOSIS — R059 Cough, unspecified: Secondary | ICD-10-CM

## 2016-09-13 MED ORDER — LORATADINE 10 MG PO TABS: 10.0000 mg | ORAL_TABLET | Freq: Every day | ORAL | 11 refills | Status: DC

## 2016-09-13 NOTE — Progress Notes (Signed)
Subjective  Patient is presenting with the following illnesses  Cough Allergies Having runny nose and itchy eyes and cough.  Using flonase but no antihistamine.  No fever or wheeze or leg swelling or rash  Bronchospasm Ran out of inhaler given in ER.  Some shortness of breath with exertion.  Sleeps ok    Chief Complaint noted Review of Symptoms - see HPI PMH - Smoking status noted.     Objective Vital Signs reviewed alertnad Nasal congestion Heart - Regular rate and rhythm.  No murmurs, gallops or rubs.    Lungs:  Normal respiratory effort, chest expands symmetrically. Lungs are clear to auscultation, no crackles or wheezes. Able todo 15 deep knee bends with tachypnea or any wheezing    Assessments/Plans  No problem-specific Assessment & Plan notes found for this encounter.   See Encounter view if individual problem A/Ps not visible See after visit summary for details of patient instuctions

## 2016-09-13 NOTE — Assessment & Plan Note (Signed)
I do not think he has significant bronchospasm based on exam.  Asked to stop his inhaler.  He does like to use it but I think this is more psychological than pulmonary

## 2016-09-13 NOTE — Patient Instructions (Addendum)
Good to see you today!  Thanks for coming in.  For your allergies  Use the nasal spray every day Take Loratadine 10 mg every day Your allergies should be better in about a week   Stop using the inhaler

## 2016-09-13 NOTE — Assessment & Plan Note (Signed)
Worsened add loratadine.

## 2016-09-27 ENCOUNTER — Telehealth: Payer: Self-pay | Admitting: Family Medicine

## 2016-09-27 NOTE — Telephone Encounter (Signed)
Medical Consultation form dropped off for at front desk for completion.  Verified that patient section of form has been completed.  Last DOS/WCC with PCP was 09/13/16  Placed form in team folder to be completed by clinical staff.  Crista Luria

## 2016-09-28 NOTE — Telephone Encounter (Signed)
Form placed in PCP box 

## 2016-10-02 ENCOUNTER — Other Ambulatory Visit: Payer: Self-pay | Admitting: Family Medicine

## 2016-10-02 DIAGNOSIS — J302 Other seasonal allergic rhinitis: Secondary | ICD-10-CM

## 2016-10-03 NOTE — Telephone Encounter (Signed)
Patient's caregiver Mr. Ruthann Cancer informed that form was faxed per request. Original copy placed up front for pickup.  Derl Barrow, RN

## 2016-10-10 ENCOUNTER — Other Ambulatory Visit: Payer: Self-pay | Admitting: Family Medicine

## 2016-10-12 ENCOUNTER — Encounter (HOSPITAL_COMMUNITY): Payer: Self-pay

## 2016-10-12 ENCOUNTER — Emergency Department (HOSPITAL_COMMUNITY)
Admission: EM | Admit: 2016-10-12 | Discharge: 2016-10-12 | Disposition: A | Discharge status: Home / Self Care | Payer: Medicaid Other | Attending: Emergency Medicine | Admitting: Emergency Medicine

## 2016-10-12 DIAGNOSIS — F438 Other reactions to severe stress: Secondary | ICD-10-CM | POA: Insufficient documentation

## 2016-10-12 DIAGNOSIS — E039 Hypothyroidism, unspecified: Secondary | ICD-10-CM | POA: Diagnosis not present

## 2016-10-12 DIAGNOSIS — Z87891 Personal history of nicotine dependence: Secondary | ICD-10-CM | POA: Insufficient documentation

## 2016-10-12 DIAGNOSIS — Z79899 Other long term (current) drug therapy: Secondary | ICD-10-CM | POA: Insufficient documentation

## 2016-10-12 DIAGNOSIS — F439 Reaction to severe stress, unspecified: Secondary | ICD-10-CM | POA: Diagnosis present

## 2016-10-12 DIAGNOSIS — J45909 Unspecified asthma, uncomplicated: Secondary | ICD-10-CM | POA: Insufficient documentation

## 2016-10-12 DIAGNOSIS — F4329 Adjustment disorder with other symptoms: Secondary | ICD-10-CM

## 2016-10-12 MED ORDER — LORAZEPAM 1 MG PO TABS
0.5000 mg | ORAL_TABLET | Freq: Once | ORAL | Status: AC
  Administered 2016-10-12: 0.5 mg via ORAL
  Filled 2016-10-12: qty 1

## 2016-10-12 MED ORDER — HYDROXYZINE HCL 25 MG PO TABS: 25.0000 mg | ORAL_TABLET | Freq: Three times a day (TID) | ORAL | 0 refills | Status: DC | PRN

## 2016-10-12 NOTE — Discharge Instructions (Signed)
We recommend a follow-up with your primary care doctor regarding your visit today. You may take hydroxyzine as needed, as prescribed, for anxiety. You may return for new or concerning symptoms.

## 2016-10-12 NOTE — ED Notes (Signed)
Pt ambulatory to room in POD E with independent steady gait, NAD. Pt laying on ED stretcher and watching television.

## 2016-10-12 NOTE — ED Provider Notes (Signed)
[place] [facility] DEPT Provider Note   CSN: [id number] Arrival date & time: 10/12/16  1745     History   Chief Complaint Chief Complaint  Patient presents with  . Stress    HPI Daniel Holmes is a 46 y.o. male.  46 year old male with a history of mental developmental delay, bipolar 1 disorder, and psychosis presents to the emergency department complaining of increased stress. He states that he is "stressed out". 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 Patient feels that he needs help for this. He thinks he would best be served by having a "vacation". He states that he has never had problems with being "stressed out" before. He believes he may need some medication for this.      Past Medical History:  Diagnosis Date  . Asthma   . Bipolar 1 disorder ([place])   . Mental developmental delay   . Psychosis    See Dr Laverta [name]    Patient Active Problem List   Diagnosis Date Noted  . Cough 01/24/2016  . Encounter for long-term (current) use of other high-risk medications 09/29/2015  . Bipolar 1 disorder ([place]) 11/08/2014  . 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 Bipolar disorder with depression ([place]) 11/03/2014  . Arthritis 11/03/2014  . 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 History of ADHD 11/03/2014  . Allergic rhinitis 11/05/2012  . Hypothyroidism 03/28/2012    Past Surgical History:  Procedure Laterality Date  . HERNIA REPAIR         Home Medications    Prior to Admission medications   Medication Sig Start Date End Date Taking? Authorizing Provider  cloNIDine (CATAPRES) 0.1 MG tablet Take 1 tablet (0.1 mg total) by mouth 2 (two) times daily. 11/10/14   Pucilowska, Jolanta B, MD  fluticasone (FLONASE) 50 MCG/ACT nasal spray INSTILL 2 SPRAYS INTO BOTH NOSTRILS ONCE A DAY 10/02/16   Lind Covert, MD  hydrOXYzine (ATARAX/VISTARIL) 25 MG tablet Take 1 tablet  (25 mg total) by mouth every 8 (eight) hours as needed for anxiety. 10/12/16   Antonietta Breach, PA-C  lamoTRIgine (LAMICTAL) 100 MG tablet Take 100 mg by mouth daily.    [provider]  levothyroxine (SYNTHROID, LEVOTHROID) 75 MCG tablet TAKE (1) TABLET BY MOUTH ONCE DAILY BEFORE BREAKFAST. 10/10/16   Chambliss, Jeb Levering, MD  lidocaine (XYLOCAINE) 5 % ointment APPLY (1) APPLICATION TOPICALLY AS NEEDED FOR LOWER BACK PAIN. Patient not taking: Reported on 01/31/2016 12/09/15   Lind Covert, MD  lithium carbonate (ESKALITH) 450 MG CR tablet Take 1 tablet (450 mg total) by mouth every 12 (twelve) hours. 11/10/14   Pucilowska, Herma Ard B, MD  loratadine (CLARITIN) 10 MG tablet Take 1 tablet (10 mg total) by mouth daily. 09/13/16   Chambliss, Jeb Levering, MD  MAPAP 500 MG tablet TAKE 2 TABLETS BY MOUTH TWICE DAILY AS NEEDED FOR KNEE PAIN. Patient not taking: Reported on 09/05/2016 06/23/16   Lind Covert, MD  Multiple Vitamins-Minerals (MULTIVITAMIN ADULTS 50+) TABS Take 1 tablet by mouth daily. 11/10/14   Pucilowska, Wardell Honour, MD  naproxen (NAPROSYN) 500 MG tablet Take 1 tablet (500 mg total) by mouth 2 (two) times daily with a meal. Patient not taking: Reported on 01/31/2016 01/04/16   Lysbeth Penner, FNP  OLANZapine (ZYPREXA) 20 MG tablet Take 1.5 tablets (30 mg total) by mouth at bedtime. Take 1 and 1/2 tablets by mouth at bedtime. Patient taking differently:  Take 30 mg by mouth at bedtime.  11/10/14   Pucilowska, Jolanta B, MD  Olopatadine HCl (PAZEO) 0.7 % SOLN Place 1 drop into both eyes daily.     [provider]  omeprazole (PRILOSEC) 20 MG capsule Take 1 capsule (20 mg total) by mouth daily. 09/05/16   Jola Schmidt, MD  traZODone (DESYREL) 150 MG tablet Take 1 tablet (150 mg total) by mouth at bedtime. 11/10/14   Pucilowska, Herma Ard B, MD  trolamine salicylate (ASPERCREME/ALOE) 10 % cream Apply 1 application topically 4 (four) times daily as needed for muscle pain. Patient  not taking: Reported on 09/05/2016 12/02/14   Lind Covert, MD  Vitamin D, Ergocalciferol, (DRISDOL) 50000 UNITS CAPS capsule Take 1 capsule (50,000 Units total) by mouth every 14 (fourteen) days. 11/10/14   Clovis Fredrickson, MD    Family History No family history on file.  Social History Social History  Substance Use Topics  . Smoking status: Former Smoker    Packs/day: 0.00    Types: Cigarettes  . Smokeless tobacco: Never Used  . Alcohol use No     Allergies   Bee pollen and Penicillins   Review of Systems Review of Systems Ten systems reviewed and are negative for acute change, except as noted in the HPI.    Physical Exam Updated Vital Signs BP 108/73 (BP Location: Right Arm)   Pulse 84   Temp 98.9 F (37.2 C) (Oral)   Resp 16   SpO2 98%   Physical Exam  Constitutional: He is oriented to person, place, and time. He appears well-developed and well-nourished. No distress.  Patient calm and cooperative, in no acute distress  HENT:  Head: Normocephalic and atraumatic.  Eyes: Conjunctivae and EOM are normal. No scleral icterus.  Neck: Normal range of motion.  Cardiovascular: Normal rate, regular rhythm and intact distal pulses.   Pulmonary/Chest: Effort normal. No respiratory distress. He has no wheezes.  Respirations even and unlabored  Musculoskeletal: Normal range of motion.  Neurological: He is alert and oriented to person, place, and time. He exhibits normal muscle tone. Coordination normal.  Skin: Skin is warm and dry. No rash noted. He is not diaphoretic. No erythema. No pallor.  Psychiatric: He has a normal mood and affect. His behavior is normal.  No SI/HI  Nursing note and vitals reviewed.    ED Treatments / Results  Labs (all labs ordered are listed, but only abnormal results are displayed) Labs Reviewed - No data to display  EKG  EKG Interpretation None       Radiology No results found.  Procedures Procedures (including  critical care time)  Medications Ordered in ED Medications  LORazepam (ATIVAN) tablet 0.5 mg (0.5 mg Oral Given 10/12/16 2103)     Initial Impression / Assessment and Plan / ED Course  I have reviewed the triage vital signs and the nursing notes.  Pertinent labs & imaging results that were available during my care of the patient were reviewed by me and considered in my medical decision making (see chart for details).     46 year old male presents to the emergency department for evaluation of increased stress. Stress is situational, beginning today. Patient denies suicidal or homicidal ideations. He does not appear to be a threat to himself or others. Patient reports compliance with his daily medications. He was given a tablet of Ativan in the emergency department for stress and anxiety management. Will discharge with a short course of Atarax. Primary care follow-up advised and  return precautions given. Patient discharged in stable condition with no unaddressed concerns.   Final Clinical Impressions(s) / ED Diagnoses   Final diagnoses:  Stress and adjustment reaction    New Prescriptions Discharge Medication List as of 10/12/2016  9:07 PM    START taking these medications   Details  hydrOXYzine (ATARAX/VISTARIL) 25 MG tablet Take 1 tablet (25 mg total) by mouth every 8 (eight) hours as needed for anxiety., Starting Thu 10/12/2016, Print         Antonietta Breach, PA-C 10/12/16 2201    Gareth Morgan, MD 10/13/16 [phone]

## 2016-10-12 NOTE — ED Notes (Signed)
Kelly, PA at bedside. 

## 2016-10-12 NOTE — ED Triage Notes (Signed)
Pt reports he is here because "I'm stressed out and I want help." pt denies SI/HI, or illegal drug use.

## 2016-10-12 NOTE — ED Notes (Signed)
Pt departed in NAD, refused use of wheelchair.  

## 2016-10-17 ENCOUNTER — Ambulatory Visit (INDEPENDENT_AMBULATORY_CARE_PROVIDER_SITE_OTHER): Payer: Medicaid Other | Admitting: Family Medicine

## 2016-10-17 ENCOUNTER — Encounter: Payer: Self-pay | Admitting: Family Medicine

## 2016-10-17 VITALS — BP 110/64 | HR 79 | Temp 98.6°F | Ht 66.0 in | Wt 172.0 lb

## 2016-10-17 DIAGNOSIS — F3130 Bipolar disorder, current episode depressed, mild or moderate severity, unspecified: Secondary | ICD-10-CM | POA: Diagnosis not present

## 2016-10-17 DIAGNOSIS — Z79899 Other long term (current) drug therapy: Secondary | ICD-10-CM

## 2016-10-17 DIAGNOSIS — F319 Bipolar disorder, unspecified: Secondary | ICD-10-CM

## 2016-10-17 NOTE — Assessment & Plan Note (Signed)
PHQ9 : 10 and GAD7 : 4 Patient is endorsing increased stress recently. He endorses good compliance with his medications at this time. Denies SI/HI. No significant life changes over the past few weeks. Patient has an appointment scheduled with his regular psychiatrist later today. - No medication changes at this time. - I have strongly encouraged him to make this appointment with his psychiatrist (patient states that he had been planning to make this anyway) - Patient to discuss his current symptoms with psychiatrist. Follow up with [place] if necessary. - Labs obtained today: TSH, lithium, lipid panel, CMP

## 2016-10-17 NOTE — Progress Notes (Signed)
   HPI  CC: "Stressed out" "first time" he has experienced this; "everything" stresses him out. Unable to describe this stress.   (Tangential thoughts noted as patient regularly jumped to multiple different topics)  Onset/Duration : since Friday Factors: Stress, caused by "everything" Medications: He endorses good compliance with all his medications including but not limited to Lamictal, lithium, and Zyprexa.  Symptoms:   1. Depressed Mood: Yes   2. Decreased Interest: No  3. SI/HI: no  4. PHQ9 : 10    5. GAD7 : 4   6. Level of Impairment : mild/moderate >> patient reports difficulty "at work", patient lives within a group home.  Patient Active Problem List   Diagnosis Date Noted  . Bipolar 1 disorder ([place]) 11/08/2014  . 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 Bipolar disorder with depression ([place]) 11/03/2014  . Arthritis 11/03/2014  . 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 History of ADHD 11/03/2014  . Allergic rhinitis 11/05/2012  . Hypothyroidism 03/28/2012    Objective: BP 110/64   Pulse 79   Temp 98.6 F (37 C) (Oral)   Ht 5\' 6"  (1.676 m)   Wt 172 lb (78 kg)   SpO2 98%   BMI 27.76 kg/m  Gen: NAD, alert, cooperative, and pleasant. Speech impediment noted. CV: RRR, no murmur Resp: CTAB, no wheezes, non-labored Ext: No edema, warm Neuro: Alert and oriented, No gross deficits. Some evidence of possible developing tardive dyskinesia noted. Psych: Interacting well, good eye contact, answers questions appropriately, some tangential thought processes noted, denies significant depression at this time.    Assessment/Plan:  Bipolar disorder with depression PHQ9 : 10 and GAD7 : 4 Patient is endorsing increased stress recently. He endorses good compliance with his medications at this time. Denies SI/HI. No significant life changes over the past few weeks. Patient has an appointment scheduled with his regular psychiatrist later today. - No medication changes at  this time. - I have strongly encouraged him to make this appointment with his psychiatrist (patient states that he had been planning to make this anyway) - Patient to discuss his current symptoms with psychiatrist. Follow up with [place] if necessary. - Labs obtained today: TSH, lithium, lipid panel, CMP   Orders Placed This Encounter  Procedures  . Lithium level  . TSH  . Comprehensive metabolic panel    Order Specific Question:   Has the patient fasted?    Answer:   No  . Lipid panel    Order Specific Question:   Has the patient fasted?    Answer:   No    Elberta Leatherwood, MD,MS,  PGY3 10/17/2016 7:00 PM

## 2016-10-17 NOTE — Patient Instructions (Signed)
It was a pleasure seeing you today in our clinic. Today we discussed your stress. Here is the treatment plan we have discussed and agreed upon together:   - Continue taking your medications as prescribed. - Make sure to keep the appointment with your therapist later today. - You may continue taking naproxen for your knee pain.

## 2016-10-19 LAB — LIPID PANEL
CHOL/HDL RATIO: 6 ratio — AB (ref 0.0–5.0)
Cholesterol, Total: 211 mg/dL — ABNORMAL HIGH (ref 100–199)
HDL: 35 mg/dL — AB (ref >39)
LDL Calculated: 110 mg/dL — ABNORMAL HIGH (ref 0–99)
TRIGLYCERIDES: 329 mg/dL — AB (ref 0–149)
VLDL CHOLESTEROL CAL: 66 mg/dL — AB (ref 5–40)

## 2016-10-19 LAB — COMPREHENSIVE METABOLIC PANEL
ALT: 20 IU/L (ref 0–44)
AST: 22 IU/L (ref 0–40)
Albumin/Globulin Ratio: 2.1 (ref 1.2–2.2)
Albumin: 4.5 g/dL (ref 3.5–5.5)
Alkaline Phosphatase: 102 IU/L (ref 39–117)
BUN/Creatinine Ratio: 7 — ABNORMAL LOW (ref 9–20)
BUN: 7 mg/dL (ref 6–24)
Bilirubin Total: 0.2 mg/dL (ref 0.0–1.2)
CALCIUM: 9.9 mg/dL (ref 8.7–10.2)
CO2: 20 mmol/L (ref 18–29)
Chloride: 104 mmol/L (ref 96–106)
Creatinine, Ser: 0.95 mg/dL (ref 0.76–1.27)
GFR, EST AFRICAN AMERICAN: 109 mL/min/{1.73_m2} (ref >59)
GFR, EST NON AFRICAN AMERICAN: 94 mL/min/{1.73_m2} (ref >59)
GLUCOSE: 96 mg/dL (ref 65–99)
Globulin, Total: 2.1 g/dL (ref 1.5–4.5)
POTASSIUM: 4.4 mmol/L (ref 3.5–5.2)
Sodium: 139 mmol/L (ref 134–144)
TOTAL PROTEIN: 6.6 g/dL (ref 6.0–8.5)

## 2016-10-19 LAB — LITHIUM LEVEL: Lithium Lvl: 0.8 mmol/L (ref 0.6–1.2)

## 2016-10-19 LAB — TSH: TSH: 1.74 u[IU]/mL (ref 0.450–4.500)

## 2016-11-01 ENCOUNTER — Encounter: Payer: Self-pay | Admitting: Family Medicine

## 2017-01-05 ENCOUNTER — Other Ambulatory Visit: Payer: Self-pay | Admitting: Family Medicine

## 2017-01-11 ENCOUNTER — Emergency Department (HOSPITAL_COMMUNITY)
Admission: EM | Admit: 2017-01-11 | Discharge: 2017-01-12 | Disposition: A | Discharge status: Home / Self Care | Payer: Medicaid Other | Attending: Emergency Medicine | Admitting: Emergency Medicine

## 2017-01-11 ENCOUNTER — Encounter (HOSPITAL_COMMUNITY): Payer: Self-pay | Admitting: Emergency Medicine

## 2017-01-11 DIAGNOSIS — J45909 Unspecified asthma, uncomplicated: Secondary | ICD-10-CM | POA: Insufficient documentation

## 2017-01-11 DIAGNOSIS — Z79899 Other long term (current) drug therapy: Secondary | ICD-10-CM | POA: Diagnosis not present

## 2017-01-11 DIAGNOSIS — Z87891 Personal history of nicotine dependence: Secondary | ICD-10-CM | POA: Insufficient documentation

## 2017-01-11 DIAGNOSIS — E039 Hypothyroidism, unspecified: Secondary | ICD-10-CM | POA: Diagnosis not present

## 2017-01-11 DIAGNOSIS — R609 Edema, unspecified: Secondary | ICD-10-CM | POA: Diagnosis not present

## 2017-01-11 DIAGNOSIS — M25562 Pain in left knee: Secondary | ICD-10-CM | POA: Insufficient documentation

## 2017-01-11 DIAGNOSIS — Z91030 Bee allergy status: Secondary | ICD-10-CM | POA: Insufficient documentation

## 2017-01-11 DIAGNOSIS — Z880 Allergy status to penicillin: Secondary | ICD-10-CM | POA: Insufficient documentation

## 2017-01-11 DIAGNOSIS — R2243 Localized swelling, mass and lump, lower limb, bilateral: Secondary | ICD-10-CM | POA: Diagnosis present

## 2017-01-11 NOTE — ED Triage Notes (Signed)
Pt sts bilateral leg swelling x 2 weeks with some left knee pain

## 2017-01-11 NOTE — Discharge Instructions (Signed)
Please get compression stockings (TED hose) at the pharmacy and wear them to help with the swelling.

## 2017-01-11 NOTE — ED Provider Notes (Signed)
[place] DEPT Provider Note   CSN: [id number] Arrival date & time: 01/11/17  1826     History   Chief Complaint Chief Complaint  Patient presents with  . Leg Swelling    HPI Daniel Holmes is a 46 y.o. male.  Patient presents to the emergency department with chief complaint of bilateral lower extremity swelling and left knee pain. He states that this is not a new problem for him. It happens when he stands at work. He denies any chest pain, shortness of breath. Denies any lightheadedness or dizziness. There are no other associated symptoms or modifying factors. He denies any injuries.    The history is provided by the patient. No language interpreter was used.    Past Medical History:  Diagnosis Date  . Asthma   . Bipolar 1 disorder ([place])   . Mental developmental delay   . Psychosis    See Dr Laverta [name]    Patient Active Problem List   Diagnosis Date Noted  . Bipolar 1 disorder ([place]) 11/08/2014  . 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 Bipolar disorder with depression ([place]) 11/03/2014  . Arthritis 11/03/2014  . 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 History of ADHD 11/03/2014  . Allergic rhinitis 11/05/2012  . Hypothyroidism 03/28/2012    Past Surgical History:  Procedure Laterality Date  . HERNIA REPAIR         Home Medications    Prior to Admission medications   Medication Sig Start Date End Date Taking? Authorizing Provider  cloNIDine (CATAPRES) 0.1 MG tablet Take 1 tablet (0.1 mg total) by mouth 2 (two) times daily. 11/10/14   Pucilowska, Jolanta B, MD  fluticasone (FLONASE) 50 MCG/ACT nasal spray INSTILL 2 SPRAYS INTO BOTH NOSTRILS ONCE A DAY 10/02/16   Lind Covert, MD  hydrOXYzine (ATARAX/VISTARIL) 25 MG tablet Take 1 tablet (25 mg total) by mouth every 8 (eight) hours as needed for anxiety. 10/12/16   Antonietta Breach, PA-C  lamoTRIgine (LAMICTAL) 100 MG tablet Take 100 mg by mouth daily.    [provider]  levothyroxine (SYNTHROID, LEVOTHROID) 75 MCG tablet TAKE (1) TABLET BY MOUTH ONCE DAILY BEFORE BREAKFAST. 10/10/16   Chambliss, Jeb Levering, MD  lidocaine (XYLOCAINE) 5 % ointment APPLY (1) APPLICATION TOPICALLY AS NEEDED FOR LOWER BACK PAIN. Patient not taking: Reported on 01/31/2016 12/09/15   Lind Covert, MD  lithium carbonate (ESKALITH) 450 MG CR tablet Take 1 tablet (450 mg total) by mouth every 12 (twelve) hours. 11/10/14   Pucilowska, Herma Ard B, MD  loratadine (CLARITIN) 10 MG tablet Take 1 tablet (10 mg total) by mouth daily. 09/13/16   Chambliss, Jeb Levering, MD  MAPAP 500 MG tablet TAKE 1 TABLET BY MOUTH EVERY 6 HOURS AS NEEDED FOR HEADACHES, KNEE PAIN AND/OR BACK PAIN 01/08/17   Lind Covert, MD  Multiple Vitamins-Minerals (MULTIVITAMIN ADULTS 50+) TABS Take 1 tablet by mouth daily. 11/10/14   Pucilowska, Wardell Honour, MD  naproxen (NAPROSYN) 500 MG tablet Take 1 tablet (500 mg total) by mouth 2 (two) times daily with a meal. Patient not taking: Reported on 01/31/2016 01/04/16   Lysbeth Penner, FNP  OLANZapine (ZYPREXA) 20 MG tablet Take 1.5 tablets (30 mg total) by mouth at bedtime. Take 1 and 1/2 tablets by mouth at bedtime. Patient taking differently: Take 30 mg by mouth at bedtime.  11/10/14   Pucilowska, Jolanta B, MD  Olopatadine HCl (PAZEO) 0.7 % SOLN Place 1 drop into both eyes daily.  [provider]  omeprazole (PRILOSEC) 20 MG capsule TAKE 1 CAPSULE BY MOUTH ONCE A DAY AS NEEDED FOR ACID REFLUX 01/08/17   Lind Covert, MD  traZODone (DESYREL) 150 MG tablet Take 1 tablet (150 mg total) by mouth at bedtime. 11/10/14   Pucilowska, Herma Ard B, MD  trolamine salicylate (ASPERCREME/ALOE) 10 % cream Apply 1 application topically 4 (four) times daily as needed for muscle pain. Patient not taking: Reported on 09/05/2016 12/02/14   Lind Covert, MD  Vitamin D, Ergocalciferol, (DRISDOL) 50000 UNITS CAPS capsule Take 1 capsule (50,000 Units  total) by mouth every 14 (fourteen) days. 11/10/14   Clovis Fredrickson, MD    Family History History reviewed. No pertinent family history.  Social History Social History  Substance Use Topics  . Smoking status: Former Smoker    Packs/day: 0.00    Types: Cigarettes  . Smokeless tobacco: Never Used  . Alcohol use No     Allergies   Bee pollen and Penicillins   Review of Systems Review of Systems  All other systems reviewed and are negative.    Physical Exam Updated Vital Signs BP 111/76 (BP Location: Right Arm)   Pulse 95   Temp 99.3 F (37.4 C) (Oral)   Resp 18   SpO2 99%   Physical Exam  Constitutional: He is oriented to person, place, and time. He appears well-developed and well-nourished.  HENT:  Head: Normocephalic and atraumatic.  Eyes: Conjunctivae and EOM are normal.  Neck: Normal range of motion.  Cardiovascular: Normal rate and intact distal pulses.   Intact distal pulses bilaterally  Pulmonary/Chest: Effort normal.  Abdominal: He exhibits no distension.  Musculoskeletal: Normal range of motion.  No bony abnormality or deformity of bilateral lower extremities, range of motion and strength is 5/5, no peripheral edema noted on exam  Neurological: He is alert and oriented to person, place, and time.  Skin: Skin is dry.  Psychiatric: He has a normal mood and affect. His behavior is normal. Judgment and thought content normal.  Nursing note and vitals reviewed.    ED Treatments / Results  Labs (all labs ordered are listed, but only abnormal results are displayed) Labs Reviewed - No data to display  EKG  EKG Interpretation None       Radiology No results found.  Procedures Procedures (including critical care time)  Medications Ordered in ED Medications - No data to display   Initial Impression / Assessment and Plan / ED Course  I have reviewed the triage vital signs and the nursing notes.  Pertinent labs & imaging results that were  available during my care of the patient were reviewed by me and considered in my medical decision making (see chart for details).     Patient with reports of left knee pain which is chronic. Requesting a knee sleeve. He also reports having bilateral lower extremity swelling while at work, where he stands for extended periods of time. I have encouraged him to pick up some compression stockings. He denies any chest pain or shortness of breath. There are no other associated symptoms. He is well-appearing. Vital signs are stable. He is stable for outpatient workup.  Final Clinical Impressions(s) / ED Diagnoses   Final diagnoses:  Peripheral edema  Arthralgia of left knee    New Prescriptions New Prescriptions   No medications on file     Montine Circle, Hershal Coria 01/12/17 [place], [place], MD 01/15/17 1756

## 2017-01-12 NOTE — ED Notes (Signed)
Pt departed in NAD, refused use of wheelchair.  

## 2017-01-12 NOTE — ED Notes (Signed)
ED Provider at bedside. 

## 2017-01-24 ENCOUNTER — Ambulatory Visit: Payer: Medicaid Other | Admitting: Family Medicine

## 2017-01-24 ENCOUNTER — Ambulatory Visit (INDEPENDENT_AMBULATORY_CARE_PROVIDER_SITE_OTHER): Payer: Medicaid Other | Admitting: Family Medicine

## 2017-01-24 DIAGNOSIS — B9789 Other viral agents as the cause of diseases classified elsewhere: Secondary | ICD-10-CM

## 2017-01-24 DIAGNOSIS — J069 Acute upper respiratory infection, unspecified: Secondary | ICD-10-CM | POA: Diagnosis not present

## 2017-01-24 MED ORDER — GUAIFENESIN-DM 100-10 MG/5ML PO SYRP: 5.0000 mL | ORAL_SOLUTION | ORAL | 0 refills | Status: DC | PRN

## 2017-01-24 NOTE — Patient Instructions (Addendum)
Subjective  Patient is presenting with the following illnesses  COUGH  Has been coughing for 3-4 days. Cough is: hacky with pain in hist throat Sputum production: none Medications tried: none Taking blood pressure medications: no  Symptoms Runny nose: mild Mucous in back of throat: yes Throat burning or reflux: mild Wheezing or asthma: no Fever: no Chest Pain: when coughs Shortness of breath: no Leg swelling: no Hemoptysis: no Weight loss: no  ROS see HPI Smoking Status noted   Chief Complaint noted Review of Symptoms - see HPI PMH - Smoking status noted.     Objective Vital Signs reviewed Heart - Regular rate and rhythm.  No murmurs, gallops or rubs.    Lungs:  Normal respiratory effort, chest expands symmetrically. Lungs are clear to auscultation, no crackles or wheezes. When he blows on a piece of paper other wise takes very shallow breathes Extremities:  No cyanosis, edema, or deformity noted with good range of motion of all major joints.   Skin:  Intact without suspicious lesions or rashes Nose:  External nasal examination shows no deformity or inflammation. Nasal mucosa are pink and moist without lesions or exudates. No septal dislocation or dislocation.No obstruction to airflow. Mouth - no lesions, mucous membranes are moist, edentulous    Assessments/Plans    Cough - likely due to URI.  Robitussin with dexamethorphan every 4-6 hours  Come back in severe shortness of breath or bleeding or lasts more than 10 days

## 2017-01-24 NOTE — Progress Notes (Signed)
Subjective  Patient is presenting with the following illnesses  COUGH  Has been coughing for 3-4 days. Cough is: hacky with pain in hist throat Sputum production: none Medications tried: none Taking blood pressure medications: no  Symptoms Runny nose: mild Mucous in back of throat: yes Throat burning or reflux: mild Wheezing or asthma: no Fever: no Chest Pain: when coughs Shortness of breath: no Leg swelling: no Hemoptysis: no Weight loss: no  ROS see HPI Smoking Status noted   Chief Complaint noted Review of Symptoms - see HPI PMH - Smoking status noted.     Objective Vital Signs reviewed No acute distress frequent dry cough Heart - Regular rate and rhythm.  No murmurs, gallops or rubs.    Lungs:  Normal respiratory effort, chest expands symmetrically. Lungs are clear to auscultation, no crackles or wheezes. When he blows on a piece of paper other wise takes very shallow breaths Extremities:  No cyanosis, edema, or deformity noted with good range of motion of all major joints.   Skin:  Intact without suspicious lesions or rashes Nose:  External nasal examination shows no deformity or inflammation. Nasal mucosa are pink and moist without lesions or exudates. No septal dislocation or dislocation.No obstruction to airflow. Mouth - no lesions, mucous membranes are moist, edentulous    Assessments/Plans  No problem-specific Assessment & Plan notes found for this encounter.  Cough - likely due to URI.  Robitussin with dexamethorphan every 4-6 hours  See Encounter view if individual problem A/Ps not visible See after visit summary for details of patient instuctions

## 2017-01-31 ENCOUNTER — Ambulatory Visit: Payer: Medicaid Other | Admitting: Family Medicine

## 2017-02-07 ENCOUNTER — Encounter: Payer: Self-pay | Admitting: Family Medicine

## 2017-02-07 ENCOUNTER — Ambulatory Visit (INDEPENDENT_AMBULATORY_CARE_PROVIDER_SITE_OTHER): Payer: Medicaid Other | Admitting: Family Medicine

## 2017-02-07 VITALS — BP 110/60 | HR 74 | Temp 98.0°F | Ht 66.0 in | Wt 173.2 lb

## 2017-02-07 DIAGNOSIS — J302 Other seasonal allergic rhinitis: Secondary | ICD-10-CM | POA: Diagnosis not present

## 2017-02-07 DIAGNOSIS — M25562 Pain in left knee: Secondary | ICD-10-CM

## 2017-02-07 DIAGNOSIS — G8929 Other chronic pain: Secondary | ICD-10-CM

## 2017-02-07 MED ORDER — DICLOFENAC SODIUM 3 % TD GEL: TRANSDERMAL | 5 refills | Status: DC

## 2017-02-07 MED ORDER — FLUTICASONE PROPIONATE 50 MCG/ACT NA SUSP: NASAL | 11 refills | Status: DC

## 2017-02-07 NOTE — Patient Instructions (Signed)
I think your cough is likely related to allergies and nasal drainage. I do not think you have an infection. Let's restart you fluticasone nasal spray,one spray onto each nostril daily.  I would stay on that for at least a month and if t he cough is not better or improved by then please follow up with your reguaalr doctor, Dr. Erin Hearing.  For the knee pain, I am giving you a prescription for a gel that you place a small amount onto the left knee and rub it in gently. Do this 3 or 4 times a day. Wash your hands after using it   I would stay on that for at least a month and if the knee pain is not better or improved by then please follow up with your reguaalr doctor, Dr. Erin Hearing. It was very nice to meet you!

## 2017-02-08 DIAGNOSIS — M25561 Pain in right knee: Secondary | ICD-10-CM | POA: Insufficient documentation

## 2017-02-08 DIAGNOSIS — M25562 Pain in left knee: Secondary | ICD-10-CM

## 2017-02-08 NOTE — Progress Notes (Signed)
    CHIEF COMPLAINT / HPI:   #1. Cough: Nonproductive. Occurs day and night. This is been going on for several weeks to months. No shortness of breath. Has had no other infectious symptoms, specifically no fever, chills, myalgias. No nasal discharge. He does have some postnasal drainage noted mostly at night. Previously prescribed steroid nasal spray but "ran out of it" #2. Left knee pain: Chronic: He's had some workup for this in the past and was prescribed a knee brace which she says did not help. Pain is anterior. Intermittent throughout the day. Worse with stair climbing and long periods of sitting. Pain is 2-4 out of 10 at its worst. Sharp at times and aching other times. Sometimes the leg feels like it's going to give way but it has not.   REVIEW OF SYSTEMS:  No unusual weight change. Other than left knee pain, no specific arthralgias. No fever. See history of present illness. Denies numbness and tingling in the left lower extremity.  PERTINENT  PMH / PSH: I have reviewed the patient's medications, allergies, past medical and surgical history, smoking status.  Pertinent findings that relate to today's visit / issues include: No prior history of knee surgery. His problem list reveals a diagnosis of arthritis that'll review of his prior imaging, complete knee films in 2016, foot, lumbar spine and cervical spine x-rays dating back as far as 2009 reveal no evidence of arthritis. Intellectual disability Former smoker  OBJECTIVE:  Vital signs are reviewed.   GEN.: Well-developed male no acute distress HEENT: Op clear without exudate. TM nomral. Sinus mucosa boggy with swollen turbinates. LUNGS: Clear to auscultation bilaterally. Normal respiratory effort. No rales no wheeze. CV: Regular rate and rhythm without murmur ABDOMEN: Soft, positive bowel sounds, nontender. MSK: Normal muscle bulk and tone. KNEE: Left. Full range of motion. Ligaments intact to varus and valgus stress. Mild pain  with patellar grind test. Very minimal crepitus. Normal Lockman. Normal Thessaly. Normal McMurray. EXTREMITY: Calf soft bilaterally. SKIN: Skin area of the left knee is without any sign of rash, no unusual lesions, there is normal skin temperature. IMAGING: Complete 4 view knee x-ray July 2016 reveals only some mild lateral displacement of the patella position. There is no evidence of joint space narrowing, no osteophytes, no sclerosis. I personally reviewed these images. ASSESSMENT / PLAN: Please see problem oriented charting for details

## 2017-02-08 NOTE — Assessment & Plan Note (Signed)
He likely has some patellofemoral joint irritation and inflammation from his lateralize patella. We'll try him on topical NSAIDs. I don't think bracing is necessarily going to help him unless he got a specific custom fitted brace to change the position of the patella. He might benefit from VMO strengthening but overall his muscle bulk and tone is good so I doubt that would be a big component for his improvement.   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

## 2017-02-08 NOTE — Assessment & Plan Note (Signed)
His cough is nonproductive and chronic. His exam was normal. I think he's probably having postnasal drainage and will refill his fluticasone. See AVS for details follow-up.

## 2017-03-01 ENCOUNTER — Encounter: Payer: Self-pay | Admitting: Internal Medicine

## 2017-03-01 ENCOUNTER — Ambulatory Visit (INDEPENDENT_AMBULATORY_CARE_PROVIDER_SITE_OTHER)
Admission: RE | Admit: 2017-03-01 | Discharge: 2017-03-01 | Disposition: A | Discharge status: Home / Self Care | Payer: Medicaid Other | Source: Ambulatory Visit | Attending: Internal Medicine | Admitting: Internal Medicine

## 2017-03-01 ENCOUNTER — Ambulatory Visit (INDEPENDENT_AMBULATORY_CARE_PROVIDER_SITE_OTHER): Payer: Medicaid Other | Admitting: Internal Medicine

## 2017-03-01 ENCOUNTER — Other Ambulatory Visit (INDEPENDENT_AMBULATORY_CARE_PROVIDER_SITE_OTHER): Payer: Medicaid Other

## 2017-03-01 VITALS — BP 102/74 | HR 71 | Ht 66.0 in | Wt 172.0 lb

## 2017-03-01 DIAGNOSIS — R05 Cough: Secondary | ICD-10-CM

## 2017-03-01 DIAGNOSIS — R058 Other specified cough: Secondary | ICD-10-CM | POA: Insufficient documentation

## 2017-03-01 LAB — CBC WITH DIFFERENTIAL/PLATELET
BASOS ABS: 0 10*3/uL (ref 0.0–0.1)
Basophils Relative: 0.2 % (ref 0.0–3.0)
Eosinophils Absolute: 0.1 10*3/uL (ref 0.0–0.7)
Eosinophils Relative: 1.3 % (ref 0.0–5.0)
HEMATOCRIT: 39.8 % (ref 39.0–52.0)
Hemoglobin: 13.4 g/dL (ref 13.0–17.0)
LYMPHS PCT: 37.8 % (ref 12.0–46.0)
Lymphs Abs: 2.2 10*3/uL (ref 0.7–4.0)
MCHC: 33.6 g/dL (ref 30.0–36.0)
MCV: 92.9 fl (ref 78.0–100.0)
MONOS PCT: 7.7 % (ref 3.0–12.0)
Monocytes Absolute: 0.5 10*3/uL (ref 0.1–1.0)
NEUTROS PCT: 53 % (ref 43.0–77.0)
Neutro Abs: 3.1 10*3/uL (ref 1.4–7.7)
PLATELETS: 205 10*3/uL (ref 150.0–400.0)
RBC: 4.29 Mil/uL (ref 4.22–5.81)
RDW: 13.3 % (ref 11.5–15.5)
WBC: 5.9 10*3/uL (ref 4.0–10.5)

## 2017-03-01 NOTE — Patient Instructions (Addendum)
Flonase one twice daily   Prilosec (omeprazole) 20 mg Take 30- 60 min before your first and last meals of the day   GERD (REFLUX)  is an extremely common cause of respiratory symptoms just like yours , many times with no obvious heartburn at all.    It can be treated with medication, but also with lifestyle changes including elevation of the head of your bed (ideally with 6 inch  bed blocks),  Smoking cessation, avoidance of late meals, excessive alcohol, and avoid fatty foods, chocolate, peppermint, colas, red wine, and acidic juices such as orange juice.  NO MINT OR MENTHOL PRODUCTS SO NO COUGH DROPS   USE SUGARLESS CANDY INSTEAD (Jolley ranchers or Stover's or Life Savers) or even ice chips will also do - the key is to swallow to prevent all throat clearing. NO OIL BASED VITAMINS - use powdered substitutes.    Please remember to go to the lab and x-ray department downstairs in the basement  for your tests - we will call you with the results when they are available.     Please schedule a follow up office visit in 4 weeks, sooner if needed  with MAR from group home  Add: Next step sinus ct and consider trial of gabapentin and 1st gen H1 blockers per guidelines

## 2017-03-01 NOTE — Progress Notes (Signed)
Subjective:     Patient ID: Daniel Holmes, male   DOB: 1970/10/09,   MRN: [id number]  HPI  83 yowm Bipolar  quit smoking in 2000 moved to present group home 02/2015 with h/o "allergies" since childhood with intermittent rhinitis variable cough x "around 2016" (not clear if had before he arrived at present home)    worse since spring 2018 self referred to pulmonary clinic 03/01/2017    03/01/2017 1st [facility] office visit/ Rene Gonsoulin   Chief Complaint  Patient presents with  . Pulmonary Consult    Self referral. He states that he has been coughing off and on for the past year- non prod.   onset was never clear from pt /caretaker or chart but dthe daily pattern was  insidious x 6 m persistent dry cough ? more at work  At [facility] where he's worked  X over a year Never cough hs / always dry / not clear if anything has ever helped it including flonase and clariton   No obvious day to day or daytime variability or assoc excess/ purulent sputum or mucus plugs or hemoptysis or cp or chest tightness, subjective wheeze or overt sinus or hb symptoms. No unusual exp hx or h/o childhood pna/ asthma or knowledge of premature birth.  Sleeping ok flat without nocturnal  or early am exacerbation  of respiratory  c/o's or need for noct saba. Also denies any obvious fluctuation of symptoms with weather or environmental changes or other aggravating or alleviating factors except as outlined above   Current Allergies, Complete Past Medical History, Past Surgical History, Family History, and Social History were reviewed in [employer] record.  ROS  The following are not active complaints unless bolded Hoarseness, sore throat, dysphagia, dental problems, itching, sneezing,  nasal congestion or discharge of excess mucus or purulent secretions, ear ache,   fever, chills, sweats, unintended wt loss or wt gain, classically pleuritic or exertional cp,  orthopnea pnd or leg swelling,  presyncope, palpitations, abdominal pain, anorexia, nausea, vomiting, diarrhea  or change in bowel habits or change in bladder habits, change in stools or change in urine, dysuria, hematuria,  rash, arthralgias, visual complaints, headache, numbness, weakness or ataxia or problems with walking or coordination,  change in mood/affect or memory.        Current Meds  Medication Sig  . acetaminophen (TYLENOL) 500 MG tablet Take 500 mg by mouth every 6 (six) hours as needed.  . cloNIDine (CATAPRES) 0.1 MG tablet Take 1 tablet (0.1 mg total) by mouth 2 (two) times daily.  . Diclofenac Sodium 3 % GEL Apply a small amount to left anterior knee 3 or 4 times a day for knee pain  . fluticasone (FLONASE) 50 MCG/ACT nasal spray INSTILL 2 SPRAYS INTO BOTH NOSTRILS ONCE A DAY  . hydrOXYzine (ATARAX/VISTARIL) 25 MG tablet Take 1 tablet (25 mg total) by mouth every 8 (eight) hours as needed for anxiety.  . lamoTRIgine (LAMICTAL) 100 MG tablet Take 100 mg by mouth daily.  Marland Kitchen levothyroxine (SYNTHROID, LEVOTHROID) 75 MCG tablet TAKE (1) TABLET BY MOUTH ONCE DAILY BEFORE BREAKFAST.  Marland Kitchen lithium carbonate (ESKALITH) 450 MG CR tablet Take 1 tablet (450 mg total) by mouth every 12 (twelve) hours.  Marland Kitchen loratadine (CLARITIN) 10 MG tablet Take 1 tablet (10 mg total) by mouth daily.  Marland Kitchen MAPAP 500 MG tablet TAKE 1 TABLET BY MOUTH EVERY 6 HOURS AS NEEDED FOR HEADACHES, KNEE PAIN AND/OR BACK PAIN  . Multiple  Vitamins-Minerals (MULTIVITAMIN ADULTS 50+) TABS Take 1 tablet by mouth daily.  Marland Kitchen omeprazole (PRILOSEC) 20 MG capsule TAKE 1 CAPSULE BY MOUTH ONCE A DAY AS NEEDED FOR ACID REFLUX  . traZODone (DESYREL) 150 MG tablet Take 1 tablet (150 mg total) by mouth at bedtime.  . Vitamin D, Ergocalciferol, (DRISDOL) 50000 UNITS CAPS capsule Take 1 capsule (50,000 Units total) by mouth every 14 (fourteen) days.        Review of Systems     Objective:   Physical Exam   amb wm nad / abn affect- childlike at times  - never coughed  entire interview or exam   Wt Readings from Last 3 Encounters:  03/01/17 172 lb (78 kg)  02/07/17 173 lb 3.2 oz (78.6 kg)  01/24/17 173 lb 9.6 oz (78.7 kg)    Vital signs reviewed  - Note on arrival 02 sats  98% on RA     HEENT: nl     oropharynx. Nl external ear canals without cough reflex - edentulous - moderate bilateral non-specific turbinate edema / min mucoid dried secretions    NECK :  without JVD/Nodes/TM/ nl carotid upstrokes bilaterally   LUNGS: no acc muscle use,  Nl contour chest which is clear to A and P bilaterally without cough on insp or exp maneuvers   CV:  RRR  no s3 or murmur or increase in P2, and no edema   ABD:  soft and nontender with nl inspiratory excursion in the supine position. No bruits or organomegaly appreciated, bowel sounds nl  MS:  Nl gait/ ext warm without deformities, calf tenderness, cyanosis or clubbing No obvious joint restrictions   SKIN: warm and dry without lesions    NEURO:  alert, approp, nl sensorium with  no motor or cerebellar deficits apparent.    CXR PA and Lateral:   03/01/2017 :    I personally reviewed images and agree with radiology impression as follows:    Chronic bronchitic-reactive airway changes. No acute cardiopulmonary abnormality is observed.   Labs ordered 03/01/2017  Allergy profile      Assessment:

## 2017-03-02 LAB — RESPIRATORY ALLERGY PROFILE REGION II ~~LOC~~
Allergen, Cedar tree, t12: <0.1 kU/L
Allergen, D pternoyssinus,d7: <0.1 kU/L
Allergen, Mouse Urine Protein, e78: <0.1 kU/L
Allergen, Oak,t7: <0.1 kU/L
Allergen, P. notatum, m1: <0.1 kU/L
CLASS: 0
CLASS: 0
CLASS: 0
CLASS: 0
CLASS: 0
CLASS: 0
CLASS: 0
CLASS: 0
CLASS: 0
CLASS: 0
CLASS: 0
CLASS: 0
Cat Dander: <0.1 kU/L
Class: 0
Class: 0
Class: 0
Class: 0
Class: 0
Class: 0
Class: 0
Class: 0
Class: 0
Class: 0
Class: 0
Class: 0
Cockroach: <0.1 kU/L
D. farinae: <0.1 kU/L
Elm IgE: <0.1 kU/L
IGE (IMMUNOGLOBULIN E), SERUM: 27 kU/L (ref <=114)
Timothy Grass: <0.1 kU/L

## 2017-03-02 LAB — INTERPRETATION:

## 2017-03-02 NOTE — Assessment & Plan Note (Addendum)
Allergy profile 03/01/2017 >  Eos 0.1 /  IgE  Pending   - max rx for gerd rec 03/01/2017   The most common causes of chronic cough in immunocompetent adults include the following: upper airway cough syndrome (UACS), previously referred to as postnasal drip syndrome (PNDS), which is caused by variety of rhinosinus conditions; (2) asthma; (3) GERD; (4) chronic bronchitis from cigarette smoking or other inhaled environmental irritants; (5) nonasthmatic eosinophilic bronchitis; and (6) bronchiectasis.   These conditions, singly or in combination, have accounted for up to 94% of the causes of chronic cough in prospective studies.   Other conditions have constituted no >6% of the causes in prospective studies These have included bronchogenic carcinoma, chronic interstitial pneumonia, sarcoidosis, left ventricular failure, ACEI-induced cough, and aspiration from a condition associated with pharyngeal dysfunction.    Chronic cough is often simultaneously caused by more than one condition. A single cause has been found from 38 to 82% of the time, multiple causes from 18 to 62%. Multiply caused cough has been the result of three diseases up to 42% of the time.   Most likely this is some form of Upper airway cough syndrome (previously labeled PNDS),  is so named because it's frequently impossible to sort out how much is  CR/sinusitis with freq throat clearing (which can be related to primary GERD)   vs  causing  secondary (" extra esophageal")  GERD from wide swings in gastric pressure that occur with throat clearing, often  promoting self use of mint and menthol lozenges that reduce the lower esophageal sphincter tone and exacerbate the problem further in a cyclical fashion.   These are the same pts (now being labeled as having "irritable larynx syndrome" by some cough centers) who not infrequently have a history of having failed to tolerate ace inhibitors,  dry powder inhalers or biphosphonates or report having  atypical/extraesophageal reflux symptoms that don't respond to standard doses of PPI  and are easily confused as having aecopd or asthma flares by even experienced allergists/ pulmonologists (myself included).   Will start with eval and rx of rhinitis consistently with flonase and max rx for gerd then regroup with MAR in hand using a trust but verify approach to confirm accurate Medication  Reconciliation The principal here is that until we are certain that the  patients are doing what we've asked, it makes no sense to ask them to do more.   Next step sinus ct and consider trial of gabapentin and 1st gen H1 blockers per guidelines

## 2017-03-02 NOTE — Progress Notes (Signed)
ATC, line was busy, [facility]

## 2017-03-05 NOTE — Progress Notes (Signed)
Spoke with the pt's caregiver and notified of results

## 2017-03-05 NOTE — Progress Notes (Signed)
Spoke with the pt's caregiver and notified of results and he verbalized understanding

## 2017-03-29 ENCOUNTER — Ambulatory Visit (INDEPENDENT_AMBULATORY_CARE_PROVIDER_SITE_OTHER): Payer: Medicaid Other | Admitting: Internal Medicine

## 2017-03-29 ENCOUNTER — Encounter: Payer: Self-pay | Admitting: Internal Medicine

## 2017-03-29 DIAGNOSIS — R05 Cough: Secondary | ICD-10-CM

## 2017-03-29 DIAGNOSIS — Z23 Encounter for immunization: Secondary | ICD-10-CM

## 2017-03-29 DIAGNOSIS — R058 Other specified cough: Secondary | ICD-10-CM

## 2017-03-29 NOTE — Progress Notes (Signed)
Subjective:     Patient ID: Daniel Holmes, male   DOB: [birth year],   MRN: [id number]  HPI  70 yowm Bipolar  quit smoking in 2000 moved to present group home 02/2015 with h/o "allergies" since childhood with intermittent rhinitis variable cough x "around 2016" (not clear if had before he arrived at present home)    worse since spring 2018 self referred to pulmonary clinic 03/01/2017    03/01/2017 1st [facility] office visit/ Lucina Betty   Chief Complaint  Patient presents with  . Pulmonary Consult    Self referral. He states that he has been coughing off and on for the past year- non prod.   onset was never clear from pt /caretaker or chart but the daily pattern was  insidious x 6 m persistent dry cough ? more at work  At [facility] where he's worked  X over a year Never cough hs / always dry / not clear if anything has ever helped it including flonase and clariton   rec Allergy profile > neg  Flonase one twice daily  Prilosec (omeprazole) 20 mg Take 30- 60 min before your first and last meals of the day GERD diet   Please schedule a follow up office visit in 4 weeks, sooner if needed  with MAR from group home  Add: Next step sinus ct and consider trial of gabapentin and 1st gen H1 blockers per guidelines      03/29/2017  f/u ov/Saleem Coccia re: uacs / did diet not the ppi as rec  Chief Complaint  Patient presents with  . Follow-up    cough has been basically gone since last visit!  still clearing throat now and then but not really bothering him  Not limited by sob   No obvious day to day or daytime variability or assoc excess/ purulent sputum or mucus plugs or hemoptysis or cp or chest tightness, subjective wheeze or overt sinus or hb symptoms. No unusual exp hx or h/o childhood pna/ asthma or knowledge of premature birth.  Sleeping ok flat without nocturnal  or early am exacerbation  of respiratory  c/o's or need for noct saba. Also denies any obvious fluctuation of symptoms with  weather or environmental changes or other aggravating or alleviating factors except as outlined above   Current Allergies, Complete Past Medical History, Past Surgical History, Family History, and Social History were reviewed in [employer] record.  ROS  The following are not active complaints unless bolded Hoarseness, sore throat, dysphagia, dental problems, itching, sneezing,  nasal congestion or discharge of excess mucus or purulent secretions, ear ache,   fever, chills, sweats, unintended wt loss or wt gain, classically pleuritic or exertional cp,  orthopnea pnd or leg swelling, presyncope, palpitations, abdominal pain, anorexia, nausea, vomiting, diarrhea  or change in bowel habits or change in bladder habits, change in stools or change in urine, dysuria, hematuria,  rash, arthralgias, visual complaints, headache, numbness, weakness or ataxia or problems with walking or coordination,  change in mood/affect or memory.          Meds:  Not able to verify as did not bring updated MAR from group home.                  Objective:   Physical Exam   amb wm nad / abn affect- childlike at times  -occ throat clearing during interview    03/29/2017       170   03/01/17 172  lb (78 kg)  02/07/17 173 lb 3.2 oz (78.6 kg)  01/24/17 173 lb 9.6 oz (78.7 kg)    Vital signs reviewed  - Note on arrival 02 sats   98% RA   HEENT: nl oropharynx. Nl external ear canals without cough reflex - edentulous - mild e bilateral non-specific turbinate edema / min mucoid dried secretions noted    NECK :  without JVD/Nodes/TM/ nl carotid upstrokes bilaterally   LUNGS: no acc muscle use,  Nl contour chest which is clear to A and P bilaterally without cough on insp or exp maneuvers   CV:  RRR  no s3 or murmur or increase in P2, and no edema   ABD:  soft and nontender with nl inspiratory excursion in the supine position. No bruits or organomegaly appreciated, bowel sounds nl  MS:  Nl  gait/ ext warm without deformities, calf tenderness, cyanosis or clubbing No obvious joint restrictions   SKIN: warm and dry without lesions    NEURO:  alert, approp, nl sensorium with  no motor or cerebellar deficits apparent.             Assessment:

## 2017-03-29 NOTE — Assessment & Plan Note (Signed)
Allergy profile 03/01/2017 >  Eos 0.1 /  IgE  27 RAST neg - max rx for gerd rec 03/01/2017 > not clear this was done but improved 03/29/2017    Throat clearing is the only residual symptom and is typical of Upper airway cough syndrome (previously labeled PNDS),  is so named because it's frequently impossible to sort out how much is  CR/sinusitis with freq throat clearing (which can be related to primary GERD)   vs  causing  secondary (" extra esophageal")  GERD from wide swings in gastric pressure that occur with throat clearing, often  promoting self use of mint and menthol lozenges that reduce the lower esophageal sphincter tone and exacerbate the problem further in a cyclical fashion.   These are the same pts (now being labeled as having "irritable larynx syndrome" by some cough centers) who not infrequently have a history of having failed to tolerate ace inhibitors,  dry powder inhalers or biphosphonates or report having atypical/extraesophageal reflux symptoms that don't respond to standard doses of PPI  and are easily confused as having aecopd or asthma flares by even experienced allergists/ pulmonologists (myself included).    If day >> noct throat clearing persists first step is to add back ppi bid ac (not prn) then consider trial of gabapentin   I had an extended discussion with the patient and caretaker reviewing all relevant studies completed to date and  lasting 15 to 20 minutes of a 25 minute visit    Each maintenance medication was reviewed in detail including most importantly the difference between maintenance and prns and under what circumstances the prns are to be triggered using an action plan format that is not reflected in the computer generated alphabetically organized AVS.    Please see AVS for specific instructions unique to this visit that I personally wrote and verbalized to the the pt in detail and then reviewed with pt  by my nurse highlighting any  changes in therapy  recommended at today's visit to their plan of care.

## 2017-03-29 NOTE — Patient Instructions (Addendum)
No change in recommendations made at previous visit - if you are going to add back items to your diet that I asked you  To avoid (like chocolate) then add back no more than one per week to see what the effect if any is on your throat clearing/cough.  If the cough really starts to bother you again then first change the prilosec as previously recommended to Take 30- 60 min before your first and last meals of the day x one month then return with an accurate updated version of your medications if not better.

## 2017-04-11 ENCOUNTER — Other Ambulatory Visit: Payer: Self-pay | Admitting: Family Medicine

## 2017-05-15 ENCOUNTER — Other Ambulatory Visit: Payer: Medicaid Other

## 2017-05-15 ENCOUNTER — Telehealth: Payer: Self-pay | Admitting: Family Medicine

## 2017-05-15 NOTE — Telephone Encounter (Signed)
Pt called needing lithium levels test done. Appt @ 1:45 today

## 2017-05-15 NOTE — Telephone Encounter (Signed)
Will forward to MD to place future order for this. Daniel Holmes,CMA

## 2017-05-15 NOTE — Telephone Encounter (Signed)
Patient had lab orders from an outside provider. I sent them across the street to [place], [place]

## 2017-05-16 ENCOUNTER — Encounter (HOSPITAL_COMMUNITY): Payer: Self-pay

## 2017-05-16 ENCOUNTER — Other Ambulatory Visit: Payer: Self-pay | Admitting: Family Medicine

## 2017-05-16 ENCOUNTER — Emergency Department (HOSPITAL_COMMUNITY)
Admission: EM | Admit: 2017-05-16 | Discharge: 2017-05-16 | Disposition: A | Discharge status: Home / Self Care | Payer: Medicaid Other | Attending: Emergency Medicine | Admitting: Emergency Medicine

## 2017-05-16 ENCOUNTER — Emergency Department (HOSPITAL_COMMUNITY): Payer: Medicaid Other

## 2017-05-16 ENCOUNTER — Other Ambulatory Visit: Payer: Self-pay

## 2017-05-16 DIAGNOSIS — F909 Attention-deficit hyperactivity disorder, unspecified type: Secondary | ICD-10-CM | POA: Diagnosis not present

## 2017-05-16 DIAGNOSIS — Z79899 Other long term (current) drug therapy: Secondary | ICD-10-CM | POA: Insufficient documentation

## 2017-05-16 DIAGNOSIS — E039 Hypothyroidism, unspecified: Secondary | ICD-10-CM | POA: Diagnosis not present

## 2017-05-16 DIAGNOSIS — J45909 Unspecified asthma, uncomplicated: Secondary | ICD-10-CM | POA: Diagnosis not present

## 2017-05-16 DIAGNOSIS — H539 Unspecified visual disturbance: Secondary | ICD-10-CM | POA: Diagnosis not present

## 2017-05-16 DIAGNOSIS — Z87891 Personal history of nicotine dependence: Secondary | ICD-10-CM | POA: Insufficient documentation

## 2017-05-16 LAB — LITHIUM LEVEL: LITHIUM LVL: 0.75 mmol/L (ref 0.60–1.20)

## 2017-05-16 MED ORDER — ALBUTEROL SULFATE HFA 108 (90 BASE) MCG/ACT IN AERS
2.0000 | INHALATION_SPRAY | Freq: Once | RESPIRATORY_TRACT | Status: AC
  Administered 2017-05-16: 2 via RESPIRATORY_TRACT
  Filled 2017-05-16: qty 6.7

## 2017-05-16 MED ORDER — IPRATROPIUM-ALBUTEROL 0.5-2.5 (3) MG/3ML IN SOLN
3.0000 mL | Freq: Once | RESPIRATORY_TRACT | Status: AC
  Administered 2017-05-16: 3 mL via RESPIRATORY_TRACT
  Filled 2017-05-16: qty 3

## 2017-05-16 NOTE — ED Notes (Signed)
Pt took visual exam and was unable to make out / letters or numbers from near and far. Pt reports that everything he sees is blurred.

## 2017-05-16 NOTE — ED Notes (Signed)
Pt is alert and verbally responsive.Pt accompanied with CG. Pt denies pain, pt does report having a persistent cough.

## 2017-05-16 NOTE — ED Provider Notes (Signed)
[facility] DEPT Provider Note   CSN: [id number] Arrival date & time: 05/16/17  1252     History   Chief Complaint Chief Complaint  Patient presents with  . needs lithium level  . vision problems    HPI Daniel Holmes is a 46 y.o. male.  HPI Daniel Holmes is a 46 y.o. male with a history of bipolar disorder, mental developmental delays, psychosis, asthma, presents to emergency department with complaint of worsening visual acuity.  Patient apparently has been complaining that his vision has gotten worse over the last several weeks.  He was seen initially by optometrist and then by ophthalmology last week.  He was told that they did not see anything abnormal on his eye exam, but recommended checking his lithium level.  Patient apparently discussed this with his psychiatrist who prescribes and lithium who requested a stat lithium level draws.  Patient went to get his blood work done at [facility] but was rejected because he did not have an ID with him.  Patient is here for his lithium level checked.  Patient is also complaining of a cough and shortness of breath.  He states he has had cough on and off for the last year.  He was told it was his asthma.  He currently is not taking any inhalers.  Cough is nonproductive.  No fever or chills.  No chest pain.  Past Medical History:  Diagnosis Date  . Asthma   . Bipolar 1 disorder ([place])   . Mental developmental delay   . Psychosis [facility])    See Dr [name]    Patient Active Problem List   Diagnosis Date Noted  . Upper airway cough syndrome 03/01/2017  . Chronic pain of left knee 02/08/2017  . Bipolar 1 disorder ([place]) 11/08/2014  . 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 Bipolar disorder with depression ([place]) 11/03/2014  . Arthritis 11/03/2014  . 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 History of ADHD 11/03/2014  . Other seasonal allergic rhinitis 11/05/2012  .  Hypothyroidism 03/28/2012    Past Surgical History:  Procedure Laterality Date  . HERNIA REPAIR         Home Medications    Prior to Admission medications   Medication Sig Start Date End Date Taking? Authorizing Provider  cloNIDine (CATAPRES) 0.1 MG tablet Take 1 tablet (0.1 mg total) by mouth 2 (two) times daily. 11/10/14  Yes Pucilowska, Jolanta B, MD  cycloSPORINE (RESTASIS) 0.05 % ophthalmic emulsion Place 1 drop into both eyes 2 (two) times daily as needed (DRY EYES).   Yes [provider]  Diclofenac Sodium 3 % GEL Apply a small amount to left anterior knee 3 or 4 times a day for knee pain 02/07/17  Yes Dickie La, MD  fluticasone [facility]) 50 MCG/ACT nasal spray INSTILL 2 SPRAYS INTO BOTH NOSTRILS ONCE A DAY 02/07/17  Yes Dickie La, MD  guaiFENesin-dextromethorphan (ROBITUSSIN DM) 100-10 MG/5ML syrup TAKE 1 TEASPOONFUL BY MOUTH EVERY 4 HOURS AS NEEDED FOR COUGH. 04/12/17  Yes Lind Covert, MD  lamoTRIgine (LAMICTAL) 100 MG tablet Take 100 mg by mouth at bedtime.    Yes [provider]  levothyroxine (SYNTHROID, LEVOTHROID) 75 MCG tablet TAKE (1) TABLET BY MOUTH ONCE DAILY BEFORE BREAKFAST. 05/16/17  Yes Lind Covert, MD  lithium carbonate (ESKALITH) 450 MG CR tablet Take 1 tablet (450 mg total) by mouth every 12 (twelve) hours. 11/10/14  Yes Pucilowska, Wardell Honour, MD  loratadine (CLARITIN) 10 MG tablet Take 1 tablet (10 mg total) by mouth daily. 09/13/16  Yes Chambliss, Jeb Levering, MD  MAPAP 500 MG tablet TAKE 1 TABLET BY MOUTH EVERY 6 HOURS AS NEEDED FOR HEADACHES, KNEE PAIN AND/OR BACK PAIN 01/08/17  Yes Chambliss, Jeb Levering, MD  Multiple Vitamin (MULTIVITAMIN WITH MINERALS) TABS tablet Take 1 tablet by mouth daily.   Yes [provider]  Multiple Vitamins-Minerals (MULTIVITAMIN ADULTS 50+) TABS Take 1 tablet by mouth daily. 11/10/14  Yes Pucilowska, Jolanta B, MD  OLANZapine (ZYPREXA) 20 MG tablet Take 1.5 tablets (30 mg total) by mouth  at bedtime. Take 1 and 1/2 tablets by mouth at bedtime. Patient taking differently: Take 30 mg by mouth at bedtime.  11/10/14  Yes Pucilowska, Jolanta B, MD  Olopatadine HCl (PAZEO) 0.7 % SOLN Place 1 drop into both eyes daily as needed (every morning as needed for allergies).   Yes [provider]  omeprazole (PRILOSEC) 20 MG capsule TAKE 1 CAPSULE BY MOUTH ONCE A DAY AS NEEDED FOR ACID REFLUX 01/08/17  Yes Chambliss, Jeb Levering, MD  traZODone (DESYREL) 150 MG tablet Take 1 tablet (150 mg total) by mouth at bedtime. 11/10/14  Yes Pucilowska, Jolanta B, MD  Vitamin D, Ergocalciferol, (DRISDOL) 50000 UNITS CAPS capsule Take 1 capsule (50,000 Units total) by mouth every 14 (fourteen) days. 11/10/14  Yes Pucilowska, Jolanta B, MD  vitamin E 200 UNIT capsule Take 200 Units by mouth daily.   Yes [provider]  hydrOXYzine (ATARAX/VISTARIL) 25 MG tablet Take 1 tablet (25 mg total) by mouth every 8 (eight) hours as needed for anxiety. Patient not taking: Reported on 05/16/2017 10/12/16   Antonietta Breach, PA-C    Family History Family History  Family history unknown: Yes    Social History Social History   Tobacco Use  . Smoking status: Former Smoker    Packs/day: 1.00    Years: 10.00    Pack years: 10.00    Types: Cigarettes    Last attempt to quit: 05/29/1998    Years since quitting: 18.9  . Smokeless tobacco: Never Used  Substance Use Topics  . Alcohol use: No  . Drug use: No     Allergies   Bee pollen and Penicillins   Review of Systems Review of Systems  Constitutional: Negative for chills and fever.  Eyes: Positive for visual disturbance. Negative for photophobia, pain and redness.  Respiratory: Positive for cough. Negative for chest tightness and shortness of breath.   Cardiovascular: Negative for chest pain, palpitations and leg swelling.  Gastrointestinal: Negative for abdominal distention, abdominal pain, diarrhea, nausea and vomiting.  Genitourinary: Negative  for dysuria, frequency, hematuria and urgency.  Musculoskeletal: Negative for arthralgias, myalgias, neck pain and neck stiffness.  Skin: Negative for rash.  Allergic/Immunologic: Negative for immunocompromised state.  Neurological: Negative for dizziness, weakness, light-headedness, numbness and headaches.     Physical Exam Updated Vital Signs Ht 5\' 8"  (1.727 m)   Wt 68 kg (150 lb)   BMI 22.81 kg/m   Physical Exam  Constitutional: He is oriented to person, place, and time. He appears well-developed and well-nourished. No distress.  HENT:  Head: Normocephalic and atraumatic.  Eyes: Conjunctivae are normal.  Neck: Neck supple.  Cardiovascular: Normal rate, regular rhythm and normal heart sounds.  Pulmonary/Chest: Effort normal. No respiratory distress. He has wheezes. He has no rales.  Expiratory wheezes and decreased air movement in all lung fields bilaterally  Abdominal: Soft. Bowel sounds are normal. He exhibits  no distension. There is no tenderness. There is no rebound.  Musculoskeletal: He exhibits no edema.  Neurological: He is alert and oriented to person, place, and time.  No tremor.  Full range of motion of all extremities.  Strength is 5 out of 5 and equal bilaterally.  Grip strength is 5 out of 5 and equal bilaterally.  Normal finger to nose.  Normal gait.  Skin: Skin is warm and dry.  Nursing note and vitals reviewed.    ED Treatments / Results  Labs (all labs ordered are listed, but only abnormal results are displayed) Labs Reviewed  LITHIUM LEVEL    EKG  EKG Interpretation None       Radiology Dg Chest 2 View  Result Date: 05/16/2017 CLINICAL DATA:  Cough and shortness of breath for the past 2 weeks. Former smoker. EXAM: CHEST  2 VIEW COMPARISON:  PA and lateral chest x-ray of March 01, 2017 FINDINGS: The lungs are adequately inflated. The interstitial markings are coarse. There is no alveolar infiltrate or pleural effusion. The heart and pulmonary  vascularity are normal. The mediastinum is normal in width. The bony thorax exhibits no acute abnormality. IMPRESSION: Mild chronic bronchitic changes, stable. No acute cardiopulmonary abnormality. Electronically Signed   By: David  Martinique M.D.   On: 05/16/2017 15:28    Procedures Procedures (including critical care time)  Medications Ordered in ED Medications  ipratropium-albuterol (DUONEB) 0.5-2.5 (3) MG/3ML nebulizer solution 3 mL (3 mLs Nebulization Given 05/16/17 1554)     Initial Impression / Assessment and Plan / ED Course  I have reviewed the triage vital signs and the nursing notes.  Pertinent labs & imaging results that were available during my care of the patient were reviewed by me and considered in my medical decision making (see chart for details).    Patient with decreased visual acuity over the last several weeks.States he can only see shapes.  No visual field loss.  No eye pain.  No photophobia.  Apparently had a full eye exam just last week.  Will check lithium levels.  Patient is also wheezing and coughing.  Will get a DuoNeb.  Chest x-ray.  4:14 PM   Chest x-ray with mild chronic bronchitic changes, no acute abnormalities.  Patient is still coughing after DuoNeb.  Will provide with an inhaler to go home with.  His oxygen saturation is normal.  Lithium level is normal.  Normal neurological exam.  We will check visual acuity and I will contact ophthalmology.  4:38 PM Patient' visual acuity is 20/200 bilaterally.  I spoke with [facility] where he was seen on December 4, discussed with them the plan.  Patient apparently already had CT head and orbits done which was negative.  They have already scheduled him for an MRI of his brain, currently going through insurance approval.  We will discharge him home at this time we will have him follow-up with ophthalmology for the MRI.  Apparently on their examination no abnormality was seen.  Patient is okay with that plan.   We will have him use inhaler for cough and wheezing.  Follow-up with primary care doctor.  Vitals:   05/16/17 1347 05/16/17 1653  BP:  110/75  Pulse:  80  Resp:  18  Temp:  98.2 F (36.8 C)  TempSrc:  Oral  SpO2:  98%  Weight: 68 kg (150 lb)   Height: 5\' 8"  (1.727 m)      Final Clinical Impressions(s) / ED Diagnoses  Final diagnoses:  Visual changes    ED Discharge Orders    None       Jeannett Senior, PA-C 05/16/17 1702    Tanna Furry, MD 05/19/17 [phone]

## 2017-05-16 NOTE — ED Triage Notes (Signed)
Patient is from a group home. Patient was at the eye doctor and c/o eyes getting weaker. The group home staff member is with the patient and has a prescription for a stat lithium level on it. Group home staff person says they went to [facility] and they were full and the patient did not have his ID so they could not obtain the lab.

## 2017-05-16 NOTE — Discharge Instructions (Signed)
Please follow-up with [facility] for further treatment of your visual changes.  Your MRI is being scheduled, please call and follow-up on it in several days.  Take inhaler every 4 hours for cough and wheezing.  Please follow-up with your family doctor for further treatment of your cough

## 2017-06-05 ENCOUNTER — Encounter (HOSPITAL_COMMUNITY): Payer: Self-pay | Admitting: Emergency Medicine

## 2017-06-05 ENCOUNTER — Emergency Department (HOSPITAL_COMMUNITY): Payer: Medicaid Other

## 2017-06-05 ENCOUNTER — Other Ambulatory Visit: Payer: Self-pay

## 2017-06-05 DIAGNOSIS — Z87891 Personal history of nicotine dependence: Secondary | ICD-10-CM | POA: Diagnosis not present

## 2017-06-05 DIAGNOSIS — E039 Hypothyroidism, unspecified: Secondary | ICD-10-CM | POA: Insufficient documentation

## 2017-06-05 DIAGNOSIS — G43009 Migraine without aura, not intractable, without status migrainosus: Secondary | ICD-10-CM | POA: Diagnosis not present

## 2017-06-05 DIAGNOSIS — Z79899 Other long term (current) drug therapy: Secondary | ICD-10-CM | POA: Insufficient documentation

## 2017-06-05 DIAGNOSIS — F70 Mild intellectual disabilities: Secondary | ICD-10-CM | POA: Diagnosis not present

## 2017-06-05 DIAGNOSIS — J45909 Unspecified asthma, uncomplicated: Secondary | ICD-10-CM | POA: Insufficient documentation

## 2017-06-05 DIAGNOSIS — R51 Headache: Secondary | ICD-10-CM | POA: Diagnosis present

## 2017-06-05 LAB — BASIC METABOLIC PANEL
ANION GAP: 7 (ref 5–15)
BUN: 10 mg/dL (ref 6–20)
CHLORIDE: 103 mmol/L (ref 101–111)
CO2: 25 mmol/L (ref 22–32)
Calcium: 9.4 mg/dL (ref 8.9–10.3)
Creatinine, Ser: 0.87 mg/dL (ref 0.61–1.24)
GFR calc Af Amer: >60 mL/min (ref >60)
GLUCOSE: 142 mg/dL — AB (ref 65–99)
POTASSIUM: 3.9 mmol/L (ref 3.5–5.1)
Sodium: 135 mmol/L (ref 135–145)

## 2017-06-05 LAB — CBC WITH DIFFERENTIAL/PLATELET
BASOS ABS: 0 10*3/uL (ref 0.0–0.1)
Basophils Relative: 0 %
Eosinophils Absolute: 0.1 10*3/uL (ref 0.0–0.7)
Eosinophils Relative: 1 %
HCT: 36.2 % — ABNORMAL LOW (ref 39.0–52.0)
HEMOGLOBIN: 12.4 g/dL — AB (ref 13.0–17.0)
LYMPHS ABS: 2.8 10*3/uL (ref 0.7–4.0)
Lymphocytes Relative: 42 %
MCH: 31.7 pg (ref 26.0–34.0)
MCHC: 34.3 g/dL (ref 30.0–36.0)
MCV: 92.6 fL (ref 78.0–100.0)
Monocytes Absolute: 0.3 10*3/uL (ref 0.1–1.0)
Monocytes Relative: 4 %
NEUTROS ABS: 3.6 10*3/uL (ref 1.7–7.7)
NEUTROS PCT: 53 %
Platelets: 191 10*3/uL (ref 150–400)
RBC: 3.91 MIL/uL — AB (ref 4.22–5.81)
RDW: 13.1 % (ref 11.5–15.5)
WBC: 6.8 10*3/uL (ref 4.0–10.5)

## 2017-06-05 LAB — LITHIUM LEVEL: LITHIUM LVL: 0.76 mmol/L (ref 0.60–1.20)

## 2017-06-05 NOTE — ED Triage Notes (Addendum)
Patient from group home, staff member is with patient and states that his headache is worse than usual.  No nausea or vomiting.  Light and noise makes the headache worse.  Patient has had APAP multiple times today which is not like him.  Last dose of APAP 500mg  given at 2010.

## 2017-06-06 ENCOUNTER — Emergency Department (HOSPITAL_COMMUNITY)
Admission: EM | Admit: 2017-06-06 | Discharge: 2017-06-06 | Disposition: A | Discharge status: Home / Self Care | Payer: Medicaid Other | Attending: Emergency Medicine | Admitting: Emergency Medicine

## 2017-06-06 DIAGNOSIS — G43009 Migraine without aura, not intractable, without status migrainosus: Secondary | ICD-10-CM

## 2017-06-06 MED ORDER — KETOROLAC TROMETHAMINE 30 MG/ML IJ SOLN
30.0000 mg | Freq: Once | INTRAMUSCULAR | Status: AC
  Administered 2017-06-06: 30 mg via INTRAVENOUS
  Filled 2017-06-06: qty 1

## 2017-06-06 MED ORDER — METOCLOPRAMIDE HCL 5 MG/ML IJ SOLN
10.0000 mg | Freq: Once | INTRAMUSCULAR | Status: AC
  Administered 2017-06-06: 10 mg via INTRAVENOUS
  Filled 2017-06-06: qty 2

## 2017-06-06 MED ORDER — DIPHENHYDRAMINE HCL 50 MG/ML IJ SOLN
25.0000 mg | Freq: Once | INTRAMUSCULAR | Status: AC
  Administered 2017-06-06: 25 mg via INTRAVENOUS
  Filled 2017-06-06: qty 1

## 2017-06-06 MED ORDER — SODIUM CHLORIDE 0.9 % IV BOLUS (SEPSIS)
1000.0000 mL | Freq: Once | INTRAVENOUS | Status: AC
  Administered 2017-06-06: 1000 mL via INTRAVENOUS

## 2017-06-06 MED ORDER — IBUPROFEN 400 MG PO TABS
400.0000 mg | ORAL_TABLET | Freq: Once | ORAL | Status: AC | PRN
  Administered 2017-06-06: 400 mg via ORAL
  Filled 2017-06-06: qty 1

## 2017-06-06 NOTE — ED Notes (Signed)
Pt requesting tylenol for pain

## 2017-06-06 NOTE — Discharge Instructions (Signed)
Return to the ED with any concerns including changes in vision or speech, weakness of arms or legs, fainting, decreased level of alertness/lethargy, or any other alarming symptoms

## 2017-06-06 NOTE — ED Provider Notes (Signed)
[facility] EMERGENCY DEPARTMENT Provider Note   CSN: [id number] Arrival date & time: 06/05/17  2058     History   Chief Complaint Chief Complaint  Patient presents with  . Headache    HPI Daniel Holmes is a 47 y.o. male.  HPI  Vision with a history of bipolar and developmental delay presenting with complaint of headache.  He points to the front of his head and states he has a bad headache.  He also states the light hurts his head worse.  He was given ibuprofen in the ER several hours ago which did not help with his symptoms at all.  He has been taking Tylenol multiple times a day at his group home without any help of the headache.  He has no fevers or neck pain.  Headache was gradual in onset.  He has no changes in vision or speech.  There are no other associated systemic symptoms, there are no other alleviating or modifying factors.   Past Medical History:  Diagnosis Date  . Asthma   . Bipolar 1 disorder ([place])   . Mental developmental delay   . Psychosis [facility])    [name]    Patient Active Problem List   Diagnosis Date Noted  . Upper airway cough syndrome 03/01/2017  . Chronic pain of left knee 02/08/2017  . Bipolar 1 disorder ([place]) 11/08/2014  . 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 Bipolar disorder with depression ([place]) 11/03/2014  . Arthritis 11/03/2014  . 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 History of ADHD 11/03/2014  . Other seasonal allergic rhinitis 11/05/2012  . Hypothyroidism 03/28/2012    Past Surgical History:  Procedure Laterality Date  . HERNIA REPAIR         Home Medications    Prior to Admission medications   Medication Sig Start Date End Date Taking? Authorizing Provider  cloNIDine (CATAPRES) 0.1 MG tablet Take 1 tablet (0.1 mg total) by mouth 2 (two) times daily. 11/10/14  Yes Pucilowska, Jolanta B, MD  cycloSPORINE (RESTASIS) 0.05 % ophthalmic emulsion Place 1 drop into  both eyes 2 (two) times daily as needed (DRY EYES).   Yes [provider]  Diclofenac Sodium 3 % GEL Apply a small amount to left anterior knee 3 or 4 times a day for knee pain 02/07/17  Yes Dickie La, MD  fluticasone [facility]) 50 MCG/ACT nasal spray INSTILL 2 SPRAYS INTO BOTH NOSTRILS ONCE A DAY 02/07/17  Yes Dickie La, MD  lamoTRIgine (LAMICTAL) 100 MG tablet Take 100 mg by mouth at bedtime.    Yes [provider]  levothyroxine (SYNTHROID, LEVOTHROID) 75 MCG tablet TAKE (1) TABLET BY MOUTH ONCE DAILY BEFORE BREAKFAST. 05/16/17  Yes Lind Covert, MD  lithium carbonate (ESKALITH) 450 MG CR tablet Take 1 tablet (450 mg total) by mouth every 12 (twelve) hours. 11/10/14  Yes Pucilowska, Jolanta B, MD  loratadine (CLARITIN) 10 MG tablet Take 1 tablet (10 mg total) by mouth daily. 09/13/16  Yes Chambliss, Jeb Levering, MD  MAPAP 500 MG tablet TAKE 1 TABLET BY MOUTH EVERY 6 HOURS AS NEEDED FOR HEADACHES, KNEE PAIN AND/OR BACK PAIN 01/08/17  Yes Chambliss, Jeb Levering, MD  Multiple Vitamin (MULTIVITAMIN WITH MINERALS) TABS tablet Take 1 tablet by mouth daily.   Yes [provider]  Multiple Vitamins-Minerals (MULTIVITAMIN ADULTS 50+) TABS Take 1 tablet by mouth daily. 11/10/14  Yes Pucilowska, Jolanta B, MD  OLANZapine (ZYPREXA) 20 MG tablet  Take 1.5 tablets (30 mg total) by mouth at bedtime. Take 1 and 1/2 tablets by mouth at bedtime. Patient taking differently: Take 20 mg by mouth at bedtime.  11/10/14  Yes Pucilowska, Jolanta B, MD  Olopatadine HCl (PAZEO) 0.7 % SOLN Place 1 drop into both eyes daily as needed (every morning as needed for allergies).   Yes [provider]  omeprazole (PRILOSEC) 20 MG capsule TAKE 1 CAPSULE BY MOUTH ONCE A DAY AS NEEDED FOR ACID REFLUX 01/08/17  Yes Chambliss, Jeb Levering, MD  traZODone (DESYREL) 150 MG tablet Take 1 tablet (150 mg total) by mouth at bedtime. 11/10/14  Yes Pucilowska, Jolanta B, MD  Vitamin D, Ergocalciferol,  (DRISDOL) 50000 UNITS CAPS capsule Take 1 capsule (50,000 Units total) by mouth every 14 (fourteen) days. 11/10/14  Yes Pucilowska, Jolanta B, MD  vitamin E 200 UNIT capsule Take 200 Units by mouth daily.   Yes [provider]  guaiFENesin-dextromethorphan (ROBITUSSIN DM) 100-10 MG/5ML syrup TAKE 1 TEASPOONFUL BY MOUTH EVERY 4 HOURS AS NEEDED FOR COUGH. Patient not taking: Reported on 06/06/2017 04/12/17   Lind Covert, MD    Family History Family History  Family history unknown: Yes    Social History Social History   Tobacco Use  . Smoking status: Former Smoker    Packs/day: 1.00    Years: 10.00    Pack years: 10.00    Types: Cigarettes    Last attempt to quit: 05/29/1998    Years since quitting: 19.0  . Smokeless tobacco: Never Used  Substance Use Topics  . Alcohol use: No  . Drug use: No     Allergies   Bee pollen and Penicillins   Review of Systems Review of Systems  ROS reviewed and all otherwise negative except for mentioned in HPI   Physical Exam Updated Vital Signs BP 106/67   Pulse 76   Temp 98.3 F (36.8 C) (Oral)   Resp 16   SpO2 98%  Vitals reviewed Physical Exam  Physical Examination: General appearance - alert, well appearing, and in no distress Mental status - alert, oriented to person, place, and time Eyes - pupils equal and reactive, extraocular eye movements intact Mouth - mucous membranes moist, pharynx normal without lesions Neck - supple, no significant adenopathy Chest - clear to auscultation, no wheezes, rales or rhonchi, symmetric air entry Heart - normal rate, regular rhythm, normal S1, S2, no murmurs, rubs, clicks or gallops Neurological - alert, oriented, edentulous but otherwise normal speech, no cranial nerve deficit, strength 5/5 in extremities x 4, sensation intact Extremities - peripheral pulses normal, no pedal edema, no clubbing or cyanosis Skin - normal coloration and turgor, no rashes   ED Treatments /  Results  Labs (all labs ordered are listed, but only abnormal results are displayed) Labs Reviewed  CBC WITH DIFFERENTIAL/PLATELET - Abnormal; Notable for the following components:      Result Value   RBC 3.91 (*)    Hemoglobin 12.4 (*)    HCT 36.2 (*)    All other components within normal limits  BASIC METABOLIC PANEL - Abnormal; Notable for the following components:   Glucose, Bld 142 (*)    All other components within normal limits  LITHIUM LEVEL    EKG  EKG Interpretation None       Radiology Ct Head Wo Contrast  Result Date: 06/06/2017 CLINICAL DATA:  Headache worse than usual. EXAM: CT HEAD WITHOUT CONTRAST TECHNIQUE: Contiguous axial images were obtained from the base of  the skull through the vertex without intravenous contrast. COMPARISON:  02/16/2015 FINDINGS: BRAIN: The ventricles and sulci are normal. No intraparenchymal hemorrhage, mass effect nor midline shift. No acute large vascular territory infarcts. Grey-white matter distinction is maintained. The basal ganglia are unremarkable. No abnormal extra-axial fluid collections. Basal cisterns are not effaced and midline. Partially empty pituitary sella, an anatomic variant. The brainstem and cerebellar hemispheres are without acute abnormalities. VASCULAR: No hyperdense vessels or unexpected calcifications. SKULL/SOFT TISSUES: No skull fracture. No significant soft tissue swelling. ORBITS/SINUSES: The included ocular globes and orbital contents are normal.The mastoid air cells are clear. The included paranasal sinuses are well-aerated. OTHER: None. 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 No acute intracranial abnormality. Electronically Signed   By: Ashley Royalty M.D.   On: 06/06/2017 00:08    Procedures Procedures (including critical care time)  Medications Ordered in ED Medications  ibuprofen (ADVIL,MOTRIN) tablet 400 mg (400 mg Oral Given 06/06/17 0537)  ketorolac (TORADOL) 30 MG/ML injection 30 mg  (30 mg Intravenous Given 06/06/17 1115)  metoCLOPramide (REGLAN) injection 10 mg (10 mg Intravenous Given 06/06/17 1117)  diphenhydrAMINE (BENADRYL) injection 25 mg (25 mg Intravenous Given 06/06/17 1120)  sodium chloride 0.9 % bolus 1,000 mL (0 mLs Intravenous Stopped 06/06/17 1241)     Initial Impression / Assessment and Plan / ED Course  I have reviewed the triage vital signs and the nursing notes.  Pertinent labs & imaging results that were available during my care of the patient were reviewed by me and considered in my medical decision making (see chart for details).     Patient presented with complaint of headache.  He has a normal neurologic exam.  Head CT ordered in triage is reassuring as well.  Patient treated with migraine cocktail.  He appears comfortable and is in agreement with plan for discharge home.  Discharged with strict return precautions.  Pt agreeable with plan.  Final Clinical Impressions(s) / ED Diagnoses   Final diagnoses:  Migraine without aura and without status migrainosus, not intractable    ED Discharge Orders    None       Pixie Casino, MD 06/06/17 1358

## 2017-06-06 NOTE — ED Notes (Signed)
Daniel Holmes contacted Daniel Holmes, PT's ride. PT was picked up from ER waiting room

## 2017-06-20 ENCOUNTER — Emergency Department (HOSPITAL_COMMUNITY)
Admission: EM | Admit: 2017-06-20 | Discharge: 2017-06-20 | Disposition: A | Discharge status: Home / Self Care | Payer: Medicaid Other | Attending: Emergency Medicine | Admitting: Emergency Medicine

## 2017-06-20 ENCOUNTER — Emergency Department (HOSPITAL_COMMUNITY): Payer: Medicaid Other

## 2017-06-20 ENCOUNTER — Encounter (HOSPITAL_COMMUNITY): Payer: Self-pay

## 2017-06-20 DIAGNOSIS — F70 Mild intellectual disabilities: Secondary | ICD-10-CM | POA: Insufficient documentation

## 2017-06-20 DIAGNOSIS — Z79899 Other long term (current) drug therapy: Secondary | ICD-10-CM | POA: Diagnosis not present

## 2017-06-20 DIAGNOSIS — E039 Hypothyroidism, unspecified: Secondary | ICD-10-CM | POA: Diagnosis not present

## 2017-06-20 DIAGNOSIS — Z87891 Personal history of nicotine dependence: Secondary | ICD-10-CM | POA: Insufficient documentation

## 2017-06-20 DIAGNOSIS — J45909 Unspecified asthma, uncomplicated: Secondary | ICD-10-CM | POA: Insufficient documentation

## 2017-06-20 DIAGNOSIS — R0789 Other chest pain: Secondary | ICD-10-CM | POA: Insufficient documentation

## 2017-06-20 LAB — CBC
HEMATOCRIT: 36.7 % — AB (ref 39.0–52.0)
HEMOGLOBIN: 12.7 g/dL — AB (ref 13.0–17.0)
MCH: 31.3 pg (ref 26.0–34.0)
MCHC: 34.6 g/dL (ref 30.0–36.0)
MCV: 90.4 fL (ref 78.0–100.0)
Platelets: 189 10*3/uL (ref 150–400)
RBC: 4.06 MIL/uL — ABNORMAL LOW (ref 4.22–5.81)
RDW: 12.5 % (ref 11.5–15.5)
WBC: 5.7 10*3/uL (ref 4.0–10.5)

## 2017-06-20 LAB — I-STAT TROPONIN, ED
Troponin i, poc: 0.01 ng/mL (ref 0.00–0.08)
Troponin i, poc: 0.01 ng/mL (ref 0.00–0.08)

## 2017-06-20 LAB — BASIC METABOLIC PANEL
ANION GAP: 11 (ref 5–15)
BUN: 8 mg/dL (ref 6–20)
CO2: 21 mmol/L — AB (ref 22–32)
Calcium: 9.6 mg/dL (ref 8.9–10.3)
Chloride: 108 mmol/L (ref 101–111)
Creatinine, Ser: 0.99 mg/dL (ref 0.61–1.24)
GFR calc Af Amer: >60 mL/min (ref >60)
GFR calc non Af Amer: >60 mL/min (ref >60)
GLUCOSE: 104 mg/dL — AB (ref 65–99)
POTASSIUM: 4.1 mmol/L (ref 3.5–5.1)
Sodium: 140 mmol/L (ref 135–145)

## 2017-06-20 MED ORDER — ACETAMINOPHEN 500 MG PO TABS
1000.0000 mg | ORAL_TABLET | Freq: Once | ORAL | Status: AC
  Administered 2017-06-20: 1000 mg via ORAL
  Filled 2017-06-20: qty 2

## 2017-06-20 MED ORDER — KETOROLAC TROMETHAMINE 60 MG/2ML IM SOLN
30.0000 mg | Freq: Once | INTRAMUSCULAR | Status: AC
  Administered 2017-06-20: 30 mg via INTRAMUSCULAR
  Filled 2017-06-20: qty 2

## 2017-06-20 NOTE — ED Notes (Signed)
Pt remains in waiting room. Updated on wait for treatment room. 

## 2017-06-20 NOTE — ED Triage Notes (Signed)
Pt arrives with central chest pain that began after eating lunch about 2 hours ago. Pt states "bad pain that hurts when I breathe deep". Denies radiation, n/v, sob.   324mg  ASA and 0.4 NTG given with no relief.  116/75 Hr 96 spo2 96% RA  #18 LH

## 2017-06-20 NOTE — ED Notes (Signed)
Pt remains in waiting room. Updated again on wait for treatment room.

## 2017-06-20 NOTE — ED Notes (Signed)
Lab work, radiology results and vital signs reviewed, no critical results at this time, no change in acuity indicated.  

## 2017-06-20 NOTE — ED Notes (Signed)
Pt stable, ambulatory, and verbalizes understanding of d/c instructions.  

## 2017-06-20 NOTE — ED Notes (Signed)
Pt spoke with NS about leaving.  Updated on wait for treatment room and encouraged to stay.  Pt states if he doesn't go to a room in 5 min that he is leaving.  Pt was advised to speak with RN if he decides to leave and that he will need to have IV removed.

## 2017-06-20 NOTE — ED Provider Notes (Signed)
[facility] EMERGENCY DEPARTMENT Provider Note  CSN: [id number] Arrival date & time: 06/20/17 1357  Chief Complaint(s) Chest Pain  HPI Daniel Holmes is a 47 y.o. male   The history is provided by the patient.  Chest Pain   This is a recurrent problem. The current episode started 12 to 24 hours ago. The problem occurs constantly. The problem has not changed since onset.The pain is associated with movement. Pain location: sternal. The pain is severe. The quality of the pain is described as sharp. The pain does not radiate. The symptoms are aggravated by certain positions (palpation). Pertinent negatives include no cough, no fever, no hemoptysis, no lower extremity edema, no malaise/fatigue, no nausea, no shortness of breath and no syncope. Risk factors include male gender.   Patient denies any trauma or falls.  Past Medical History Past Medical History:  Diagnosis Date  . Asthma   . Bipolar 1 disorder ([place])   . Mental developmental delay   . Psychosis [facility])    See Dr [name]   Patient Active Problem List   Diagnosis Date Noted  . Upper airway cough syndrome 03/01/2017  . Chronic pain of left knee 02/08/2017  . Bipolar 1 disorder ([place]) 11/08/2014  . 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 Bipolar disorder with depression ([place]) 11/03/2014  . Arthritis 11/03/2014  . 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 History of ADHD 11/03/2014  . Other seasonal allergic rhinitis 11/05/2012  . Hypothyroidism 03/28/2012   Home Medication(s) Prior to Admission medications   Medication Sig Start Date End Date Taking? Authorizing Provider  cloNIDine (CATAPRES) 0.1 MG tablet Take 1 tablet (0.1 mg total) by mouth 2 (two) times daily. 11/10/14   Pucilowska, Jolanta B, MD  cycloSPORINE (RESTASIS) 0.05 % ophthalmic emulsion Place 1 drop into both eyes 2 (two) times daily as needed (DRY EYES).    [provider]  Diclofenac Sodium 3 %  GEL Apply a small amount to left anterior knee 3 or 4 times a day for knee pain 02/07/17   Dickie La, MD  fluticasone [facility]) 50 MCG/ACT nasal spray INSTILL 2 SPRAYS INTO BOTH NOSTRILS ONCE A DAY 02/07/17   Dickie La, MD  guaiFENesin-dextromethorphan (ROBITUSSIN DM) 100-10 MG/5ML syrup TAKE 1 TEASPOONFUL BY MOUTH EVERY 4 HOURS AS NEEDED FOR COUGH. Patient not taking: Reported on 06/06/2017 04/12/17   Lind Covert, MD  lamoTRIgine (LAMICTAL) 100 MG tablet Take 100 mg by mouth at bedtime.     [provider]  levothyroxine (SYNTHROID, LEVOTHROID) 75 MCG tablet TAKE (1) TABLET BY MOUTH ONCE DAILY BEFORE BREAKFAST. 05/16/17   Lind Covert, MD  lithium carbonate (ESKALITH) 450 MG CR tablet Take 1 tablet (450 mg total) by mouth every 12 (twelve) hours. 11/10/14   Pucilowska, Herma Ard B, MD  loratadine (CLARITIN) 10 MG tablet Take 1 tablet (10 mg total) by mouth daily. 09/13/16   Chambliss, Jeb Levering, MD  MAPAP 500 MG tablet TAKE 1 TABLET BY MOUTH EVERY 6 HOURS AS NEEDED FOR HEADACHES, KNEE PAIN AND/OR BACK PAIN 01/08/17   Lind Covert, MD  Multiple Vitamin (MULTIVITAMIN WITH MINERALS) TABS tablet Take 1 tablet by mouth daily.    [provider]  Multiple Vitamins-Minerals (MULTIVITAMIN ADULTS 50+) TABS Take 1 tablet by mouth daily. 11/10/14   Pucilowska, Jolanta B, MD  OLANZapine (ZYPREXA) 20 MG tablet Take 1.5 tablets (30 mg total) by mouth at bedtime. Take 1 and 1/2 tablets by mouth at bedtime.  Patient taking differently: Take 20 mg by mouth at bedtime.  11/10/14   Pucilowska, Jolanta B, MD  Olopatadine HCl (PAZEO) 0.7 % SOLN Place 1 drop into both eyes daily as needed (every morning as needed for allergies).    [provider]  omeprazole (PRILOSEC) 20 MG capsule TAKE 1 CAPSULE BY MOUTH ONCE A DAY AS NEEDED FOR ACID REFLUX 01/08/17   Lind Covert, MD  traZODone (DESYREL) 150 MG tablet Take 1 tablet (150 mg total) by mouth at bedtime. 11/10/14    Pucilowska, Wardell Honour, MD  Vitamin D, Ergocalciferol, (DRISDOL) 50000 UNITS CAPS capsule Take 1 capsule (50,000 Units total) by mouth every 14 (fourteen) days. 11/10/14   Pucilowska, Herma Ard B, MD  vitamin E 200 UNIT capsule Take 200 Units by mouth daily.    [provider]                                                                                                                                    Past Surgical History Past Surgical History:  Procedure Laterality Date  . HERNIA REPAIR     Family History Family History  Family history unknown: Yes    Social History Social History   Tobacco Use  . Smoking status: Former Smoker    Packs/day: 1.00    Years: 10.00    Pack years: 10.00    Types: Cigarettes    Last attempt to quit: 05/29/1998    Years since quitting: 19.0  . Smokeless tobacco: Never Used  Substance Use Topics  . Alcohol use: No  . Drug use: No   Allergies Bee pollen and Penicillins  Review of Systems Review of Systems  Constitutional: Negative for fever and malaise/fatigue.  Respiratory: Negative for cough, hemoptysis and shortness of breath.   Cardiovascular: Positive for chest pain. Negative for syncope.  Gastrointestinal: Negative for nausea.   All other systems are reviewed and are negative for acute change except as noted in the HPI  Physical Exam Vital Signs  I have reviewed the triage vital signs BP 114/69 (BP Location: Right Arm)   Pulse 81   Temp 98.2 F (36.8 C) (Oral)   Resp (!) 21   SpO2 96%   Physical Exam  Constitutional: He is oriented to person, place, and time. He appears well-developed and well-nourished. No distress.  HENT:  Head: Normocephalic and atraumatic.  Nose: Nose normal.  Edentulous  Eyes: Conjunctivae and EOM are normal. Pupils are equal, round, and reactive to light. Right eye exhibits no discharge. Left eye exhibits no discharge. No scleral icterus.  Neck: Normal range of motion. Neck supple.    Cardiovascular: Normal rate and regular rhythm. Exam reveals no gallop and no friction rub.  No murmur heard. Pulmonary/Chest: Effort normal and breath sounds normal. No stridor. No respiratory distress. He has no rales. He exhibits tenderness. He exhibits no mass, no laceration, no edema, no deformity and no  swelling.    Abdominal: Soft. He exhibits no distension. There is no tenderness.  Musculoskeletal: He exhibits no edema or tenderness.  Neurological: He is alert and oriented to person, place, and time.  Skin: Skin is warm and dry. No rash noted. He is not diaphoretic. No erythema.  Psychiatric: He has a normal mood and affect.  Vitals reviewed.   ED Results and Treatments Labs (all labs ordered are listed, but only abnormal results are displayed) Labs Reviewed  BASIC METABOLIC PANEL - Abnormal; Notable for the following components:      Result Value   CO2 21 (*)    Glucose, Bld 104 (*)    All other components within normal limits  CBC - Abnormal; Notable for the following components:   RBC 4.06 (*)    Hemoglobin 12.7 (*)    HCT 36.7 (*)    All other components within normal limits  I-STAT TROPONIN, ED  I-STAT TROPONIN, ED                                                                                                                         EKG  EKG Interpretation  Date/Time:  Wednesday June 20 2017 14:00:56 EST Ventricular Rate:  87 PR Interval:  152 QRS Duration: 84 QT Interval:  346 QTC Calculation: 416 R Axis:   20 Text Interpretation:  Normal sinus rhythm with sinus arrhythmia Normal ECG No significant change since last tracing Confirmed by Addison Lank [phone]) on 06/20/2017 7:15:18 PM      Radiology Dg Chest 2 View  Result Date: 06/20/2017 CLINICAL DATA:  47 year old male with chest pain for 2 hrs. Pleuritic pain. EXAM: CHEST  2 VIEW COMPARISON:  05/16/2017 and earlier. FINDINGS: Mildly lower lung volumes. Mediastinal contours remain normal. Visualized  tracheal air column is within normal limits. No pneumothorax, pulmonary edema, pleural effusion or confluent pulmonary opacity. Negative visible bowel gas pattern. No acute osseous abnormality identified. IMPRESSION: Mildly lower lung volumes.  No acute cardiopulmonary abnormality. Electronically Signed   By: Genevie Ann M.D.   On: 06/20/2017 15:40   Pertinent labs & imaging results that were available during my care of the patient were reviewed by me and considered in my medical decision making (see chart for details).  Medications Ordered in ED Medications  acetaminophen (TYLENOL) tablet 1,000 mg (1,000 mg Oral Given 06/20/17 2003)  ketorolac (TORADOL) injection 30 mg (30 mg Intramuscular Given 06/20/17 2039)  Procedures Procedures  (including critical care time)  Medical Decision Making / ED Course I have reviewed the nursing notes for this encounter and the patient's prior records (if available in EHR or on provided paperwork).    Presentation is consistent with chest wall pain.  Highly inconsistent with ACS.  EKG without acute ischemic changes or evidence of pericarditis.  Serial troponins negative x2.  Presentation not consistent with aortic dissection or esophageal perforation.  Low pretest probability for pulmonary embolism. Chest x-ray without evidence suggestive of pneumonia, pneumothorax, pneumomediastinum.  No abnormal contour of the mediastinum to suggest dissection. No evidence of acute injuries.  The patient appears reasonably screened and/or stabilized for discharge and I doubt any other medical condition or other [facility] requiring further screening, evaluation, or treatment in the ED at this time prior to discharge.  The patient is safe for discharge with strict return precautions.   Final Clinical Impression(s) / ED Diagnoses Final diagnoses:  Sternal  pain   Disposition: Discharge  Condition: Good  I have discussed the results, Dx and Tx plan with the patient who expressed understanding and agree(s) with the plan. Discharge instructions discussed at great length. The patient was given strict return precautions who verbalized understanding of the instructions. No further questions at time of discharge.    ED Discharge Orders    None       Follow Up: Lind Covert, MD [place] [address] [phone]  Schedule an appointment as soon as possible for a visit  As needed      This chart was dictated using voice recognition software.  Despite best efforts to proofread,  errors can occur which can change the documentation meaning.   Fatima Blank, MD 06/20/17 2159

## 2017-06-22 ENCOUNTER — Other Ambulatory Visit: Payer: Self-pay | Admitting: *Deleted

## 2017-06-22 MED ORDER — OMEPRAZOLE 20 MG PO CPDR: 20.0000 mg | DELAYED_RELEASE_CAPSULE | Freq: Every day | ORAL | 4 refills | Status: DC | PRN

## 2017-07-03 ENCOUNTER — Ambulatory Visit: Payer: Medicaid Other | Admitting: Family Medicine

## 2017-07-03 ENCOUNTER — Other Ambulatory Visit: Payer: Self-pay

## 2017-07-03 ENCOUNTER — Encounter: Payer: Self-pay | Admitting: Family Medicine

## 2017-07-03 DIAGNOSIS — R05 Cough: Secondary | ICD-10-CM | POA: Diagnosis not present

## 2017-07-03 DIAGNOSIS — R058 Other specified cough: Secondary | ICD-10-CM

## 2017-07-03 MED ORDER — SALINE SPRAY 0.65 % NA SOLN: 2.0000 | Freq: Four times a day (QID) | NASAL | 11 refills | Status: DC

## 2017-07-03 MED ORDER — BENZONATATE 100 MG PO CAPS: 100.0000 mg | ORAL_CAPSULE | Freq: Two times a day (BID) | ORAL | 0 refills | Status: DC | PRN

## 2017-07-03 NOTE — Assessment & Plan Note (Addendum)
Worsened?   His cough seems to come and go.  Lung exam and chest xrays normal.  Will stop bronchodilator and continue PPI and flonase and add nasal saline and as needed tessalon

## 2017-07-03 NOTE — Patient Instructions (Signed)
Good to see you today!  Thanks for coming in.  For your Cough  Do not he inhaler regularly  Use Tessalon perles up to twice a day as needed  Use Nasal Saline (over the counter) 2 squirts each nostril at least 4 x a day  Come back in 2 months for a check up

## 2017-07-03 NOTE — Progress Notes (Signed)
Subjective  Patient is presenting with the following illnesses  Cough - continues on and off.  He can not identify reasons or things that worsen..   Not helped by inhaler.  Has had normal CXR (I reviewed) and ECG (I reviewed) in ER recently.  Cough syryp not helping.  No weight loss or bleeding or fevers or sputum   Chief Complaint noted Review of Symptoms - see HPI PMH - Smoking status noted.     Objective Vital Signs reviewed Alert nad frequent sniffing and dry cough Neck:  No deformities, thyromegaly, masses, or tenderness noted.   Supple with full range of motion without pain. Mouth - no lesions, mucous membranes are moist, no decaying teeth   Lungs:  Normal respiratory effort, chest expands symmetrically. Lungs are clear to auscultation, no crackles or wheezes. Heart - Regular rate and rhythm.  No murmurs, gallops or rubs.    Extremities:  No cyanosis, edema, or deformity noted with good range of motion of all major joints.       Assessments/Plans  No problem-specific Assessment & Plan notes found for this encounter.   See after visit summary for details of patient instuctions

## 2017-07-19 ENCOUNTER — Other Ambulatory Visit: Payer: Self-pay | Admitting: Family Medicine

## 2017-08-02 ENCOUNTER — Other Ambulatory Visit: Payer: Self-pay

## 2017-08-02 ENCOUNTER — Emergency Department (HOSPITAL_COMMUNITY)
Admission: EM | Admit: 2017-08-02 | Discharge: 2017-08-02 | Disposition: A | Discharge status: Home / Self Care | Payer: Medicaid Other | Attending: Emergency Medicine | Admitting: Emergency Medicine

## 2017-08-02 ENCOUNTER — Encounter (HOSPITAL_COMMUNITY): Payer: Self-pay

## 2017-08-02 DIAGNOSIS — E039 Hypothyroidism, unspecified: Secondary | ICD-10-CM | POA: Diagnosis not present

## 2017-08-02 DIAGNOSIS — J45909 Unspecified asthma, uncomplicated: Secondary | ICD-10-CM | POA: Diagnosis not present

## 2017-08-02 DIAGNOSIS — Z87891 Personal history of nicotine dependence: Secondary | ICD-10-CM | POA: Diagnosis not present

## 2017-08-02 DIAGNOSIS — F909 Attention-deficit hyperactivity disorder, unspecified type: Secondary | ICD-10-CM | POA: Diagnosis not present

## 2017-08-02 DIAGNOSIS — F79 Unspecified intellectual disabilities: Secondary | ICD-10-CM | POA: Diagnosis not present

## 2017-08-02 DIAGNOSIS — M25561 Pain in right knee: Secondary | ICD-10-CM | POA: Diagnosis not present

## 2017-08-02 DIAGNOSIS — G8929 Other chronic pain: Secondary | ICD-10-CM

## 2017-08-02 DIAGNOSIS — Z79899 Other long term (current) drug therapy: Secondary | ICD-10-CM | POA: Diagnosis not present

## 2017-08-02 DIAGNOSIS — M25562 Pain in left knee: Secondary | ICD-10-CM | POA: Insufficient documentation

## 2017-08-02 NOTE — Discharge Instructions (Signed)
Please read attached information. If you experience any new or worsening signs or symptoms please return to the emergency room for evaluation. Please follow-up with your primary care provider or specialist as discussed.  °

## 2017-08-02 NOTE — ED Provider Notes (Signed)
[place] EMERGENCY DEPARTMENT Provider Note   CSN: [id number] Arrival date & time: 08/02/17  1141     History   Chief Complaint Chief Complaint  Patient presents with  . Knee Pain    HPI Daniel Holmes is a 47 y.o. male.  HPI   47 year old male presents today with complaints of bilateral chronic knee pain.  Patient notes 20+ history of knee pain worsening but slowly.  Denies any fever or chills, swelling, denies any trauma.  He reports taking Tylenol without significant improvement.  Patient denies any other complaints.  Pain with range of motion.  Past Medical History:  Diagnosis Date  . Asthma   . Bipolar 1 disorder ([place])   . Mental developmental delay   . Psychosis [facility])    See Dr [name]    Patient Active Problem List   Diagnosis Date Noted  . Upper airway cough syndrome 03/01/2017  . Chronic pain of left knee 02/08/2017  . Bipolar 1 disorder ([place]) 11/08/2014  . 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 Bipolar disorder with depression ([place]) 11/03/2014  . Arthritis 11/03/2014  . 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 History of ADHD 11/03/2014  . Other seasonal allergic rhinitis 11/05/2012  . Hypothyroidism 03/28/2012    Past Surgical History:  Procedure Laterality Date  . HERNIA REPAIR         Home Medications    Prior to Admission medications   Medication Sig Start Date End Date Taking? Authorizing Provider  benzonatate (TESSALON) 100 MG capsule Take 1 capsule (100 mg total) by mouth 2 (two) times daily as needed for cough. 07/03/17   Lind Covert, MD  cloNIDine (CATAPRES) 0.1 MG tablet Take 1 tablet (0.1 mg total) by mouth 2 (two) times daily. 11/10/14   Pucilowska, Jolanta B, MD  cycloSPORINE (RESTASIS) 0.05 % ophthalmic emulsion Place 1 drop into both eyes 2 (two) times daily as needed (DRY EYES).    [provider]  Diclofenac Sodium 3 % GEL Apply a small amount to left  anterior knee 3 or 4 times a day for knee pain 02/07/17   Dickie La, MD  fluticasone [facility]) 50 MCG/ACT nasal spray INSTILL 2 SPRAYS INTO BOTH NOSTRILS ONCE A DAY 02/07/17   Dickie La, MD  lamoTRIgine (LAMICTAL) 100 MG tablet Take 100 mg by mouth at bedtime.     [provider]  levothyroxine (SYNTHROID, LEVOTHROID) 75 MCG tablet TAKE (1) TABLET BY MOUTH ONCE DAILY BEFORE BREAKFAST. 05/16/17   Lind Covert, MD  lithium carbonate (ESKALITH) 450 MG CR tablet Take 1 tablet (450 mg total) by mouth every 12 (twelve) hours. 11/10/14   Pucilowska, Herma Ard B, MD  loratadine (CLARITIN) 10 MG tablet TAKE (1) TABLET BY MOUTH ONCE DAILY. 07/19/17   Chambliss, Jeb Levering, MD  MAPAP 500 MG tablet TAKE 1 TABLET BY MOUTH EVERY 6 HOURS AS NEEDED FOR HEADACHES, KNEE PAIN AND/OR BACK PAIN 01/08/17   Lind Covert, MD  Multiple Vitamin (MULTIVITAMIN WITH MINERALS) TABS tablet Take 1 tablet by mouth daily.    [provider]  Multiple Vitamins-Minerals (MULTIVITAMIN ADULTS 50+) TABS Take 1 tablet by mouth daily. 11/10/14   Pucilowska, Jolanta B, MD  OLANZapine (ZYPREXA) 20 MG tablet Take 1.5 tablets (30 mg total) by mouth at bedtime. Take 1 and 1/2 tablets by mouth at bedtime. Patient taking differently: Take 20 mg by mouth at bedtime.  11/10/14   Pucilowska, Wardell Honour, MD  Olopatadine  HCl (PAZEO) 0.7 % SOLN Place 1 drop into both eyes daily as needed (every morning as needed for allergies).    [provider]  omeprazole (PRILOSEC) 20 MG capsule Take 1 capsule (20 mg total) by mouth daily as needed. 06/22/17   Lind Covert, MD  sodium chloride (OCEAN) 0.65 % SOLN nasal spray Place 2 sprays into both nostrils 4 (four) times daily. 07/03/17   Lind Covert, MD  traZODone (DESYREL) 150 MG tablet Take 1 tablet (150 mg total) by mouth at bedtime. 11/10/14   Pucilowska, Wardell Honour, MD  Vitamin D, Ergocalciferol, (DRISDOL) 50000 UNITS CAPS capsule Take 1 capsule (50,000  Units total) by mouth every 14 (fourteen) days. 11/10/14   Pucilowska, Herma Ard B, MD  vitamin E 200 UNIT capsule Take 200 Units by mouth daily.    [provider]    Family History Family History  Family history unknown: Yes    Social History Social History   Tobacco Use  . Smoking status: Former Smoker    Packs/day: 1.00    Years: 10.00    Pack years: 10.00    Types: Cigarettes    Last attempt to quit: 05/29/1998    Years since quitting: 19.1  . Smokeless tobacco: Never Used  Substance Use Topics  . Alcohol use: No  . Drug use: No     Allergies   Bee pollen and Penicillins   Review of Systems Review of Systems  All other systems reviewed and are negative.    Physical Exam Updated Vital Signs BP 128/71 (BP Location: Right Arm)   Pulse 77   Temp 98.1 F (36.7 C) (Oral)   Resp 18   SpO2 98%   Physical Exam  Constitutional: He is oriented to person, place, and time. He appears well-developed and well-nourished.  HENT:  Head: Normocephalic and atraumatic.  Eyes: Conjunctivae are normal. Pupils are equal, round, and reactive to light. Right eye exhibits no discharge. Left eye exhibits no discharge. No scleral icterus.  Neck: Normal range of motion. No JVD present. No tracheal deviation present.  Pulmonary/Chest: Effort normal. No stridor.  Musculoskeletal:  Bilateral knees atraumatic no swelling or edema, no redness or warmth to touch full active range of motion pain with range of motion of bilateral knees pain with palpation of the anterior portion of the knee diffusely  Neurological: He is alert and oriented to person, place, and time. Coordination normal.  Psychiatric: He has a normal mood and affect. His behavior is normal. Judgment and thought content normal.  Nursing note and vitals reviewed.    ED Treatments / Results  Labs (all labs ordered are listed, but only abnormal results are displayed) Labs Reviewed - No data to display  EKG  EKG  Interpretation None       Radiology No results found.  Procedures Procedures (including critical care time)  Medications Ordered in ED Medications - No data to display   Initial Impression / Assessment and Plan / ED Course  I have reviewed the triage vital signs and the nursing notes.  Pertinent labs & imaging results that were available during my care of the patient were reviewed by me and considered in my medical decision making (see chart for details).      Final Clinical Impressions(s) / ED Diagnoses   Final diagnoses:  Chronic pain of both knees    Labs:   Imaging:  Consults:  Therapeutics:  Discharge Meds:   Assessment/Plan: Patient with chronic knee pain, no  swelling or edema, no fever low suspicion for infection no trauma.  Patient encouraged follow-up with orthopedics Tylenol as needed for discomfort.  Return precautions given.  Patient verbalized understanding and agreement to this plan.      ED Discharge Orders    None       Francee Gentile 08/02/17 1352    Duffy Bruce, MD 08/03/17 [phone]

## 2017-08-02 NOTE — ED Triage Notes (Signed)
Pt reports bilateral knee pain for several days. Hx of arthritis. Pt ambulatory.

## 2017-09-05 ENCOUNTER — Encounter: Payer: Self-pay | Admitting: Family Medicine

## 2017-09-05 ENCOUNTER — Ambulatory Visit (INDEPENDENT_AMBULATORY_CARE_PROVIDER_SITE_OTHER): Payer: Medicaid Other | Admitting: Family Medicine

## 2017-09-05 DIAGNOSIS — M25562 Pain in left knee: Secondary | ICD-10-CM | POA: Diagnosis not present

## 2017-09-05 DIAGNOSIS — J302 Other seasonal allergic rhinitis: Secondary | ICD-10-CM

## 2017-09-05 DIAGNOSIS — M25561 Pain in right knee: Secondary | ICD-10-CM

## 2017-09-05 DIAGNOSIS — G8929 Other chronic pain: Secondary | ICD-10-CM

## 2017-09-05 MED ORDER — FLUTICASONE PROPIONATE 50 MCG/ACT NA SUSP: NASAL | 11 refills | Status: DC

## 2017-09-05 MED ORDER — ACETAMINOPHEN ER 650 MG PO TBCR: 650.0000 mg | EXTENDED_RELEASE_TABLET | Freq: Three times a day (TID) | ORAL | 6 refills | Status: DC | PRN

## 2017-09-05 NOTE — Assessment & Plan Note (Signed)
Worsened  Use flonase regularly

## 2017-09-05 NOTE — Progress Notes (Signed)
Subjective  Patient is presenting with the following illnesses  JOINT PAIN Location: Knees both sides.   Course:  Hurts  Most of the time.  Had xrays in 2016  Worse with:  Movement but also at rest Better with:  Wearing a sleeve - has one for R knee only Trauma: no Swelling:  no Locking:  no Other Joints involved:  Mild pain in hands Rash: no Fever:  no  ALLERGIES Pollen is bothering him.  Causes cough and sneeze and wheeze.  No fever or sputum Not using flonase currently   Chief Complaint noted Review of Symptoms - see HPI PMH - Smoking status noted.     Objective Vital Signs reviewed Alert nad Lungs:  Normal respiratory effort, chest expands symmetrically. Lungs are clear to auscultation, no crackles or wheezes. Knees - FROM No effusion or crepitus or laxity or locking.  Pain diffusely with range of motion less if distracted Nose - no discharge mild redness Eye - Pupils Equal Round Reactive to light, Extraocular movements intact, Fundi without hemorrhage or visible lesions, Conjunctiva without redness or discharge     Assessments/Plans  Bilateral knee pain May have mild DJD.  No signs of systemic arthritis or infection or gout.  See after visit summary   Other seasonal allergic rhinitis Worsened  Use flonase regularly    See after visit summary for details of patient instuctions

## 2017-09-05 NOTE — Assessment & Plan Note (Signed)
May have mild DJD.  No signs of systemic arthritis or infection or gout.  See after visit summary

## 2017-09-05 NOTE — Patient Instructions (Addendum)
Good to see you today!  Thanks for coming in.  For the Knee Pain  Use Diclofenac cream  rub four times a day along with  Tylenol arthritis - one capsule every 8 hours STOP the MPAP 500 mg medicine  Get another sleeve for your Left Knee  For the Allergies  Use Flonase nasal spray every morning  Come back for a physical - bring all your medication list - Come fasting if we need blood work

## 2017-09-07 ENCOUNTER — Telehealth: Payer: Self-pay | Admitting: Family Medicine

## 2017-09-07 NOTE — Telephone Encounter (Signed)
Placed in MDs box to be filled out. Deseree Blount, CMA  

## 2017-09-07 NOTE — Telephone Encounter (Signed)
Patient guardian Heide Scales [phone]), wants to get a FL2 form filled out for patient asap, or get a copy of one that is on file.  He said one that was used before was not marked "domicary".  Please call him to get this sorted out.  I informed him that we need a DPR with guardian's name on it, which I did not find.

## 2017-09-07 NOTE — Telephone Encounter (Signed)
Fax number [phone]

## 2017-09-07 NOTE — Telephone Encounter (Signed)
Pls let him know I will fill out one if he can drop it off.  Let him know about our 2 business day turnaround Thanks  [facility]

## 2017-09-10 NOTE — Telephone Encounter (Signed)
I filled out part and signed  Please print out medication list and attach and fax  His supervisor will need to fill out top information  Thanks [place]

## 2017-09-11 NOTE — Telephone Encounter (Signed)
Placed in fax box/informed amy to fax. Fayth Trefry Kennon Holter, CMA

## 2017-09-17 ENCOUNTER — Emergency Department (HOSPITAL_COMMUNITY)
Admission: EM | Admit: 2017-09-17 | Discharge: 2017-09-17 | Disposition: A | Discharge status: Home / Self Care | Payer: Medicaid Other | Attending: Emergency Medicine | Admitting: Emergency Medicine

## 2017-09-17 ENCOUNTER — Encounter (HOSPITAL_COMMUNITY): Payer: Self-pay

## 2017-09-17 DIAGNOSIS — R51 Headache: Secondary | ICD-10-CM | POA: Diagnosis present

## 2017-09-17 DIAGNOSIS — G43809 Other migraine, not intractable, without status migrainosus: Secondary | ICD-10-CM | POA: Insufficient documentation

## 2017-09-17 DIAGNOSIS — Z79899 Other long term (current) drug therapy: Secondary | ICD-10-CM | POA: Insufficient documentation

## 2017-09-17 DIAGNOSIS — J45909 Unspecified asthma, uncomplicated: Secondary | ICD-10-CM | POA: Insufficient documentation

## 2017-09-17 DIAGNOSIS — F17210 Nicotine dependence, cigarettes, uncomplicated: Secondary | ICD-10-CM | POA: Insufficient documentation

## 2017-09-17 MED ORDER — KETOROLAC TROMETHAMINE 30 MG/ML IJ SOLN
30.0000 mg | Freq: Once | INTRAMUSCULAR | Status: AC
  Administered 2017-09-17: 30 mg via INTRAVENOUS
  Filled 2017-09-17: qty 1

## 2017-09-17 MED ORDER — DIPHENHYDRAMINE HCL 50 MG/ML IJ SOLN
25.0000 mg | Freq: Once | INTRAMUSCULAR | Status: AC
  Administered 2017-09-17: 25 mg via INTRAVENOUS
  Filled 2017-09-17: qty 1

## 2017-09-17 MED ORDER — SODIUM CHLORIDE 0.9 % IV BOLUS: 500.0000 mL | Freq: Once | INTRAVENOUS | Status: DC

## 2017-09-17 MED ORDER — METOCLOPRAMIDE HCL 5 MG/ML IJ SOLN
10.0000 mg | Freq: Once | INTRAMUSCULAR | Status: AC
  Administered 2017-09-17: 10 mg via INTRAVENOUS
  Filled 2017-09-17: qty 2

## 2017-09-17 MED ORDER — SODIUM CHLORIDE 0.9 % IV BOLUS
1000.0000 mL | Freq: Once | INTRAVENOUS | Status: AC
  Administered 2017-09-17: 1000 mL via INTRAVENOUS

## 2017-09-17 NOTE — ED Triage Notes (Signed)
Pt reports headache all night last night with no relief from tylenol. Pt talking on phone in triage. Denies photosensitivity.

## 2017-09-17 NOTE — ED Provider Notes (Signed)
[facility] EMERGENCY DEPARTMENT Provider Note   CSN: [id number] Arrival date & time: 09/17/17  0730     History   Chief Complaint Chief Complaint  Patient presents with  . Headache    HPI Daniel Holmes is a 47 y.o. male with PMH/o Bipolar, Developmental Delay.  Patient reports a headache was gradual in nature and progressively worsened.  He denies any thunderclap headache.  Patient states that the headache is mostly in the frontal part of his head.  Patient reports he gets headaches frequently and has been treated here in the ED for migraines.  Patient reports that this feels consistent with his past episodes of headaches.  He denies any trauma, injury, fall preceding his symptoms.  Patient reports that he took Tylenol with no improvement.  Patient denies any alleviating or aggravating factors.  Patient states that he has not had any fever, photophobia, vision changes, speech difficulty, numbness/weakness of his extremities, nausea/vomiting, neck pain. .  The history is provided by the patient.    Past Medical History:  Diagnosis Date  . Asthma   . Bipolar 1 disorder ([place])   . Mental developmental delay   . Psychosis [facility])    See Dr [name]    Patient Active Problem List   Diagnosis Date Noted  . Upper airway cough syndrome 03/01/2017  . Bilateral knee pain 02/08/2017  . Bipolar 1 disorder ([place]) 11/08/2014  . 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 Bipolar disorder with depression ([place]) 11/03/2014  . 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 History of ADHD 11/03/2014  . Other seasonal allergic rhinitis 11/05/2012  . Hypothyroidism 03/28/2012    Past Surgical History:  Procedure Laterality Date  . HERNIA REPAIR          Home Medications    Prior to Admission medications   Medication Sig Start Date End Date Taking? Authorizing Provider  acetaminophen (TYLENOL) 650 MG CR tablet Take 1 tablet (650 mg total)  by mouth every 8 (eight) hours as needed for pain. 09/05/17  Yes Chambliss, Jeb Levering, MD  cloNIDine (CATAPRES) 0.1 MG tablet Take 1 tablet (0.1 mg total) by mouth 2 (two) times daily. 11/10/14  Yes Pucilowska, Jolanta B, MD  cycloSPORINE (RESTASIS) 0.05 % ophthalmic emulsion Place 1 drop into both eyes 2 (two) times daily as needed (DRY EYES).   Yes [provider]  Diclofenac Sodium 3 % GEL Apply a small amount to left anterior knee 3 or 4 times a day for knee pain 02/07/17  Yes Dickie La, MD  fluticasone (FLONASE) 50 MCG/ACT nasal spray INSTILL 2 SPRAYS INTO BOTH NOSTRILS ONCE A DAY 09/05/17  Yes Chambliss, Jeb Levering, MD  levothyroxine (SYNTHROID, LEVOTHROID) 75 MCG tablet TAKE (1) TABLET BY MOUTH ONCE DAILY BEFORE BREAKFAST. 05/16/17  Yes Lind Covert, MD  lithium carbonate (ESKALITH) 450 MG CR tablet Take 1 tablet (450 mg total) by mouth every 12 (twelve) hours. 11/10/14  Yes Pucilowska, Jolanta B, MD  loratadine (CLARITIN) 10 MG tablet TAKE (1) TABLET BY MOUTH ONCE DAILY. 07/19/17  Yes Chambliss, Jeb Levering, MD  Multiple Vitamins-Minerals (MULTIVITAMIN ADULTS 50+) TABS Take 1 tablet by mouth daily. 11/10/14  Yes Pucilowska, Jolanta B, MD  OLANZapine (ZYPREXA) 20 MG tablet Take 1.5 tablets (30 mg total) by mouth at bedtime. Take 1 and 1/2 tablets by mouth at bedtime. Patient taking differently: Take 20 mg by mouth at bedtime.  11/10/14  Yes Pucilowska, Jolanta B, MD  Olopatadine HCl (  PAZEO) 0.7 % SOLN Place 1 drop into both eyes daily as needed (every morning as needed for allergies).   Yes [provider]  omeprazole (PRILOSEC) 20 MG capsule Take 1 capsule (20 mg total) by mouth daily as needed. 06/22/17  Yes Chambliss, Jeb Levering, MD  sodium chloride (OCEAN) 0.65 % SOLN nasal spray Place 2 sprays into both nostrils 4 (four) times daily. 07/03/17  Yes Lind Covert, MD  traZODone (DESYREL) 150 MG tablet Take 1 tablet (150 mg total) by mouth at bedtime. 11/10/14  Yes  Pucilowska, Jolanta B, MD  Vitamin D, Ergocalciferol, (DRISDOL) 50000 UNITS CAPS capsule Take 1 capsule (50,000 Units total) by mouth every 14 (fourteen) days. 11/10/14  Yes Pucilowska, Jolanta B, MD  vitamin E 200 UNIT capsule Take 200 Units by mouth daily.   Yes [provider]  lamoTRIgine (LAMICTAL) 100 MG tablet Take 100 mg by mouth at bedtime.     [provider]    Family History Family History  Family history unknown: Yes    Social History Social History   Tobacco Use  . Smoking status: Former Smoker    Packs/day: 1.00    Years: 10.00    Pack years: 10.00    Types: Cigarettes    Last attempt to quit: 05/29/1998    Years since quitting: 19.3  . Smokeless tobacco: Never Used  Substance Use Topics  . Alcohol use: No  . Drug use: No     Allergies   Bee pollen and Penicillins   Review of Systems Review of Systems  Constitutional: Negative for fever.  Eyes: Negative for photophobia and visual disturbance.  Respiratory: Negative for cough and shortness of breath.   Cardiovascular: Negative for chest pain.  Gastrointestinal: Negative for abdominal pain, nausea and vomiting.  Musculoskeletal: Negative for neck pain.  Neurological: Positive for headaches. Negative for weakness.     Physical Exam Updated Vital Signs BP 114/71 (BP Location: Right Arm)   Pulse 81   Temp 98.3 F (36.8 C) (Oral)   Resp 16   SpO2 98%   Physical Exam  Constitutional: He is oriented to person, place, and time. He appears well-developed and well-nourished.  HENT:  Head: Normocephalic and atraumatic.  Mouth/Throat: Oropharynx is clear and moist and mucous membranes are normal.  No tenderness to palpation of skull. No deformities or crepitus noted. No open wounds, abrasions or lacerations.   Eyes: Pupils are equal, round, and reactive to light. Conjunctivae, EOM and lids are normal.  Neck: Full passive range of motion without pain. Neck supple. No neck rigidity.  Neck  is supple without rigidity.  Cardiovascular: Normal rate, regular rhythm, normal heart sounds and normal pulses. Exam reveals no gallop and no friction rub.  No murmur heard. Pulmonary/Chest: Effort normal and breath sounds normal.  Abdominal: Soft. Normal appearance.  Musculoskeletal: Normal range of motion.  Neurological: He is alert and oriented to person, place, and time.  Cranial nerves III-XII intact Follows commands, Moves all extremities  5/5 strength to BUE and BLE  Sensation intact throughout all major nerve distributions Normal finger to nose. No dysdiadochokinesia. No pronator drift. No gait abnormalities  No slurred speech. No facial droop.   Skin: Skin is warm and dry. Capillary refill takes less than 2 seconds.  Psychiatric: He has a normal mood and affect. His speech is normal.  Nursing note and vitals reviewed.    ED Treatments / Results  Labs (all labs ordered are listed, but only abnormal results  are displayed) Labs Reviewed - No data to display  EKG None  Radiology No results found.  Procedures Procedures (including critical care time)  Medications Ordered in ED Medications  ketorolac (TORADOL) 30 MG/ML injection 30 mg (30 mg Intravenous Given 09/17/17 1016)  metoCLOPramide (REGLAN) injection 10 mg (10 mg Intravenous Given 09/17/17 1017)  diphenhydrAMINE (BENADRYL) injection 25 mg (25 mg Intravenous Given 09/17/17 1019)  sodium chloride 0.9 % bolus 1,000 mL (0 mLs Intravenous Stopped 09/17/17 1116)     Initial Impression / Assessment and Plan / ED Course  I have reviewed the triage vital signs and the nursing notes.  Pertinent labs & imaging results that were available during my care of the patient were reviewed by me and considered in my medical decision making (see chart for details).     47 y.o. M with PMH/o developmental delay, Bipolar who presents for evaluation of headache that began last night.  Patient reports a history of headaches.   Records reviewed to that he had been treated for migraine in January 2018 after similar presentation.  Ports headache was gradual in nature.  No thunderclap headache.  No preceding trauma, injury, fall.  Taking Tylenol with no relief. Patient is afebrile, non-toxic appearing, sitting comfortably on examination table. Vital signs reviewed and stable. No neuro deficits noted on exam.  Neck is supple without rigidity.  History/physical exam is not concerning for meningitis.  Consider migraine versus tension headache.  History/physical exam is concerning for CVA, ICH, sinus cavernous thrombosis. IVF given for fluid resuscitation. Analgesics provided in the department.  Reevaluation after migraine cocktail.  Patient reports headache is resolved completely.  He is ambulating without any difficulty.  He has been tolerating p.o. in the department on difficulty.  Vital signs stable.  Patient stable for discharge at this time.  Encouraged follow-up with primary care doctor. Patient had ample opportunity for questions and discussion. All patient's questions were answered with full understanding. Strict return precautions discussed. Patient expresses understanding and agreement to plan.   Final Clinical Impressions(s) / ED Diagnoses   Final diagnoses:  Other migraine without status migrainosus, not intractable    ED Discharge Orders    None       Desma Mcgregor 09/17/17 1628    Milton Ferguson, MD 09/18/17 0730

## 2017-09-17 NOTE — Discharge Instructions (Signed)
Follow-up with your primary care doctor as needed.  Return to emergency department for any worsening pain, fevers, vision changes, nausea/vomiting or any other worsening or concerning symptoms.

## 2017-10-16 ENCOUNTER — Encounter: Payer: Medicaid Other | Admitting: Family Medicine

## 2017-10-26 ENCOUNTER — Emergency Department (HOSPITAL_COMMUNITY)
Admission: EM | Admit: 2017-10-26 | Discharge: 2017-10-26 | Disposition: A | Discharge status: Home / Self Care | Payer: Medicaid Other | Attending: Emergency Medicine | Admitting: Emergency Medicine

## 2017-10-26 ENCOUNTER — Other Ambulatory Visit: Payer: Self-pay

## 2017-10-26 ENCOUNTER — Emergency Department (HOSPITAL_COMMUNITY): Payer: Medicaid Other

## 2017-10-26 ENCOUNTER — Encounter (HOSPITAL_COMMUNITY): Payer: Self-pay | Admitting: Emergency Medicine

## 2017-10-26 DIAGNOSIS — Z79899 Other long term (current) drug therapy: Secondary | ICD-10-CM | POA: Insufficient documentation

## 2017-10-26 DIAGNOSIS — E039 Hypothyroidism, unspecified: Secondary | ICD-10-CM | POA: Insufficient documentation

## 2017-10-26 DIAGNOSIS — Z87891 Personal history of nicotine dependence: Secondary | ICD-10-CM | POA: Insufficient documentation

## 2017-10-26 DIAGNOSIS — J45909 Unspecified asthma, uncomplicated: Secondary | ICD-10-CM | POA: Insufficient documentation

## 2017-10-26 DIAGNOSIS — M79645 Pain in left finger(s): Secondary | ICD-10-CM | POA: Insufficient documentation

## 2017-10-26 MED ORDER — IBUPROFEN 800 MG PO TABS
800.0000 mg | ORAL_TABLET | Freq: Once | ORAL | Status: AC
  Administered 2017-10-26: 800 mg via ORAL
  Filled 2017-10-26: qty 1

## 2017-10-26 NOTE — ED Notes (Signed)
Pt verbalized understanding discharge instructions and denies any further needs or questions at this time. VS stable, ambulatory and steady gait.   

## 2017-10-26 NOTE — Discharge Instructions (Addendum)
Please read and follow all provided instructions.  You have been seen today for pain/swelling to your left 3rd finger.   Tests performed today include: An x-ray of the affected area - does NOT show any broken bones or dislocations.  Vital signs. See below for your results today.   Please utilize the splint provided as needed for comfort. Remove splint if it is too tight, your finger feels numb, or it starts to appear blue.   Use Ibuprofen (Motrin/Advil) 600mg  every 6 hours as needed for pain (do not exceed max dose in 24 hours, 2400mg )  Follow-up instructions: Please follow-up with your primary care provider if you continue to have significant pain in 1 week. In this case you may have a more severe injury that requires further care.   Return instructions:  Return to the ER immediately for redness, warmth, fevers, or worsening swelling.  Please return if your fingers or hand are numb or tingling, appear gray or blue, or you have severe pain (also elevate the hand and loosen splint or wrap if you were given one) Please return to the Emergency Department if you experience worsening symptoms.  Please return if you have any other emergent concerns. Additional Information:  Your vital signs today were: BP 109/70    Pulse 78    Temp 99 F (37.2 C) (Oral)    Resp 15    Ht 5\' 6"  (1.676 m)    Wt 80.3 kg (177 lb)    SpO2 98%    BMI 28.57 kg/m  If your blood pressure (BP) was elevated above 135/85 this visit, please have this repeated by your doctor within one month. ---------------

## 2017-10-26 NOTE — ED Triage Notes (Signed)
Pt states L middle finger started swelling today. Pt denies and trauma to the site. Pt can bend finger and cap refill is < 3. Pt has taken Tylenol for the pain with no relief. Pain is 10/10.

## 2017-10-26 NOTE — ED Provider Notes (Signed)
[facility] EMERGENCY DEPARTMENT Provider Note   CSN: [id number] Arrival date & time: 10/26/17  1604     History   Chief Complaint Chief Complaint  Patient presents with  . finger swelling    L middle    HPI Daniel Holmes is a 47 y.o. male with a hx of bipolar 1 disorder, mental developmental delay, and speech articulation disorder who presents to the ED with complaints of L middle finger pain/swelling that started this morning. Describes the swelling/discomfort as being to the proximal/middle phalanx region.. Patient states that he woke up with his sxs, they have been constant. Pain is a 10/10 in severity, worse with movement. He states he tried tylenol without relief. Denies numbness, weakness, tingling, fever, chills, or change in color. Patient cannot recall injury or change in activity, he did not get anything stuck in the finger, no splinter or contact with wood.   HPI  Past Medical History:  Diagnosis Date  . Asthma   . Bipolar 1 disorder ([place])   . Mental developmental delay   . Psychosis [facility])    [name]    Patient Active Problem List   Diagnosis Date Noted  . Upper airway cough syndrome 03/01/2017  . Bilateral knee pain 02/08/2017  . Bipolar 1 disorder ([place]) 11/08/2014  . 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 Bipolar disorder with depression ([place]) 11/03/2014  . 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 History of ADHD 11/03/2014  . Other seasonal allergic rhinitis 11/05/2012  . Hypothyroidism 03/28/2012    Past Surgical History:  Procedure Laterality Date  . HERNIA REPAIR          Home Medications    Prior to Admission medications   Medication Sig Start Date End Date Taking? Authorizing Provider  acetaminophen (TYLENOL) 650 MG CR tablet Take 1 tablet (650 mg total) by mouth every 8 (eight) hours as needed for pain. 09/05/17   Lind Covert, MD  cloNIDine (CATAPRES) 0.1 MG tablet  Take 1 tablet (0.1 mg total) by mouth 2 (two) times daily. 11/10/14   Pucilowska, Jolanta B, MD  cycloSPORINE (RESTASIS) 0.05 % ophthalmic emulsion Place 1 drop into both eyes 2 (two) times daily as needed (DRY EYES).    [provider]  Diclofenac Sodium 3 % GEL Apply a small amount to left anterior knee 3 or 4 times a day for knee pain 02/07/17   Dickie La, MD  fluticasone [facility]) 50 MCG/ACT nasal spray INSTILL 2 SPRAYS INTO BOTH NOSTRILS ONCE A DAY 09/05/17   Lind Covert, MD  lamoTRIgine (LAMICTAL) 100 MG tablet Take 100 mg by mouth at bedtime.     [provider]  levothyroxine (SYNTHROID, LEVOTHROID) 75 MCG tablet TAKE (1) TABLET BY MOUTH ONCE DAILY BEFORE BREAKFAST. 05/16/17   Lind Covert, MD  lithium carbonate (ESKALITH) 450 MG CR tablet Take 1 tablet (450 mg total) by mouth every 12 (twelve) hours. 11/10/14   Pucilowska, Herma Ard B, MD  loratadine (CLARITIN) 10 MG tablet TAKE (1) TABLET BY MOUTH ONCE DAILY. 07/19/17   Lind Covert, MD  Multiple Vitamins-Minerals (MULTIVITAMIN ADULTS 50+) TABS Take 1 tablet by mouth daily. 11/10/14   Pucilowska, Jolanta B, MD  OLANZapine (ZYPREXA) 20 MG tablet Take 1.5 tablets (30 mg total) by mouth at bedtime. Take 1 and 1/2 tablets by mouth at bedtime. Patient taking differently: Take 20 mg by mouth at bedtime.  11/10/14   Clovis Fredrickson, MD  Olopatadine HCl (PAZEO) 0.7 % SOLN Place 1 drop into both eyes daily as needed (every morning as needed for allergies).    [provider]  omeprazole (PRILOSEC) 20 MG capsule Take 1 capsule (20 mg total) by mouth daily as needed. 06/22/17   Lind Covert, MD  sodium chloride (OCEAN) 0.65 % SOLN nasal spray Place 2 sprays into both nostrils 4 (four) times daily. 07/03/17   Lind Covert, MD  traZODone (DESYREL) 150 MG tablet Take 1 tablet (150 mg total) by mouth at bedtime. 11/10/14   Pucilowska, Wardell Honour, MD  Vitamin D, Ergocalciferol, (DRISDOL)  50000 UNITS CAPS capsule Take 1 capsule (50,000 Units total) by mouth every 14 (fourteen) days. 11/10/14   Pucilowska, Herma Ard B, MD  vitamin E 200 UNIT capsule Take 200 Units by mouth daily.    [provider]    Family History Family History  Family history unknown: Yes    Social History Social History   Tobacco Use  . Smoking status: Former Smoker    Packs/day: 1.00    Years: 10.00    Pack years: 10.00    Types: Cigarettes    Last attempt to quit: 05/29/1998    Years since quitting: 19.4  . Smokeless tobacco: Never Used  Substance Use Topics  . Alcohol use: No  . Drug use: No     Allergies   Bee pollen and Penicillins   Review of Systems Review of Systems  Constitutional: Negative for chills and fever.  Musculoskeletal:       Positive for L 3rd finger pain/swelling.   Skin: Negative for color change and wound.  Neurological: Negative for weakness and numbness.     Physical Exam Updated Vital Signs BP 109/70   Pulse 78   Temp 99 F (37.2 C) (Oral)   Resp 15   Ht 5\' 6"  (1.676 m)   Wt 80.3 kg (177 lb)   SpO2 98%   BMI 28.57 kg/m   Physical Exam  Constitutional: He appears well-developed and well-nourished. No distress.  HENT:  Head: Normocephalic and atraumatic.  Eyes: Conjunctivae are normal. Right eye exhibits no discharge. Left eye exhibits no discharge.  Cardiovascular:  Pulses:      Radial pulses are 2+ on the right side, and 2+ on the left side.  Musculoskeletal:  Upper Extremities: No appreciable swelling- symmetric circumferences to middle and proximal phalanx when utilizing tape measurer to compare to contralateral digit. No obvious deformity. No wounds. No overlying erythema or warmth. Patient has full ROM to all digits (MCP/PIP/DIP) as well as the wrists. He has mild tenderness to palpation over the L middle phalanx, moderate tenderness to palpation over the proximal phalanx. Joints are nontender. No other areas of tenderness.     Neurological: He is alert.  Clear speech. Sensation grossly intact to bilateral upper extremities. Patient has 5/5 symmetric grip strength. He is able to perform OK sign, thumbs up, and cross 2nd/3rd digits bilaterally.   Psychiatric: He has a normal mood and affect. His behavior is normal. Thought content normal.  Nursing note and vitals reviewed.   ED Treatments / Results  Labs (all labs ordered are listed, but only abnormal results are displayed) Labs Reviewed - No data to display  EKG None  Radiology Dg Hand Complete Left  Result Date: 10/26/2017 CLINICAL DATA:  Left middle finger started swelling today. No trauma. EXAM: LEFT HAND - COMPLETE 3+ VIEW COMPARISON:  None. FINDINGS: No acute fractures or dislocations. There is linear  high attenuation along the ulnar side of the distal aspect of the proximal third phalanx. There is a well corticated notch in the lunate of doubtful acute significance. No other abnormalities. IMPRESSION: 1. There is a small linear density in the soft tissues of the third finger measuring 2 mm, just proximal to the proximal phalanx along the ulnar aspect of the finger. This could represent soft tissue calcification, sequela of previous injury given the lack of current trauma, or a foreign body. Recommend clinical correlation. Electronically Signed   By: Dorise Bullion III M.D   On: 10/26/2017 16:59    Procedures Procedures (including critical care time)  Medications Ordered in ED Medications  ibuprofen (ADVIL,MOTRIN) tablet 800 mg (has no administration in time range)    Initial Impression / Assessment and Plan / ED Course  I have reviewed the triage vital signs and the nursing notes.  Pertinent labs & imaging results that were available during my care of the patient were reviewed by me and considered in my medical decision making (see chart for details).   Patient presents with L 3rd digit pain/swelling. Patient nontoxic appearing, in no apparent  distress, vitals WNL. Exam without appreciable swelling. Tender to palpation over middle and proximal phalanx of 3rd digit, ROM intact, NVI distally. Patient is afebrile, no overlying wounds, erythema, or warmth, doubt infectious etiology such as septic joint, cellulitis, or tenosynovitis. No erythema, pain is to phalanges, not necessarily articular, doubt gout. X-ray: IMPRESSION: 1. There is a small linear density in the soft tissues of the third finger measuring 2 mm, just proximal to the proximal phalanx along the ulnar aspect of the finger. This could represent soft tissue calcification, sequela of previous injury given the lack of current trauma, or a foreign body. Recommend clinical correlation. Electronically Signed   By: Dorise Bullion III M.D   On: 10/26/2017 16:59  I re-examined the patient and there are absolutely no overlying skin abnormalities in this location, no skin breaks, no erythema, he is adamant regarding no concern for FB. Unclear definitively etiology to patient's symptoms, will place in splint, recommended ibuprofen. I discussed results, treatment plan, need for PCP follow-up, and strict return precautions- specifically infection- with the patient. Provided opportunity for questions, patient confirmed understanding and is in agreement with plan.   Findings and plan of care discussed with supervising physician Dr. Rex Kras who is in agreement.   Final Clinical Impressions(s) / ED Diagnoses   Final diagnoses:  Pain of finger of left hand    ED Discharge Orders    None       Amaryllis Dyke, PA-C 10/26/17 1718    Little, Wenda Overland, MD 10/26/17 1734

## 2017-11-14 ENCOUNTER — Ambulatory Visit: Payer: Medicaid Other | Admitting: Family Medicine

## 2017-11-14 ENCOUNTER — Other Ambulatory Visit: Payer: Self-pay

## 2017-11-14 ENCOUNTER — Encounter: Payer: Self-pay | Admitting: Family Medicine

## 2017-11-14 DIAGNOSIS — Z79899 Other long term (current) drug therapy: Secondary | ICD-10-CM

## 2017-11-14 DIAGNOSIS — M25562 Pain in left knee: Secondary | ICD-10-CM | POA: Diagnosis not present

## 2017-11-14 DIAGNOSIS — M25561 Pain in right knee: Secondary | ICD-10-CM

## 2017-11-14 DIAGNOSIS — F319 Bipolar disorder, unspecified: Secondary | ICD-10-CM

## 2017-11-14 DIAGNOSIS — G8929 Other chronic pain: Secondary | ICD-10-CM | POA: Diagnosis not present

## 2017-11-14 DIAGNOSIS — E038 Other specified hypothyroidism: Secondary | ICD-10-CM | POA: Diagnosis present

## 2017-11-14 DIAGNOSIS — H04123 Dry eye syndrome of bilateral lacrimal glands: Secondary | ICD-10-CM | POA: Diagnosis not present

## 2017-11-14 NOTE — Assessment & Plan Note (Signed)
Stable.  Encourage activity

## 2017-11-14 NOTE — Assessment & Plan Note (Signed)
Stable Check monitoring labs

## 2017-11-14 NOTE — Assessment & Plan Note (Signed)
Worsened.  Refilled eye drops.  No signs of infection or iritis or glaucoma

## 2017-11-14 NOTE — Patient Instructions (Signed)
Good to see you today!  Thanks for coming in.  Restart the restasis for the dry eyes  I will call you if any problems with the lab tests

## 2017-11-14 NOTE — Progress Notes (Signed)
Subjective  Daniel Holmes is a 47 y.o. male is presenting with the following  IRRITATED EYES Eyes feel dry and scratchy.  Seeing better since got glasses.  No specific pain or difference between his eyes.  No trauma.  Has not been using his eye drops - ran out   BIPOLAR Stable.  His psychiatrist would like monitoring blood tests.  No hallucinations or acting out.  No specific muscle pain  KNEE PAIN - DJD Hurts on and off.  Able to mow grass.  Wears sleeves.  No redness or soft tissue swelling   Chief Complaint noted Review of Symptoms - see HPI PMH - Smoking status noted.    Objective Vital Signs reviewed BP 140/70   Pulse 72   Temp 98.4 F (36.9 C) (Oral)   Ht 5\' 6"  (1.676 m)   Wt 171 lb 6.4 oz (77.7 kg)   SpO2 98%   BMI 27.66 kg/m  Alert conversant  Wearing new glasses Heart - Regular rate and rhythm.  No murmurs, gallops or rubs.    Lungs:  Normal respiratory effort, chest expands symmetrically. Lungs are clear to auscultation, no crackles or wheezes. Extremities:  No cyanosis, edema, or deformity noted with good range of motion of all major joints.   Eye - Pupils Equal Round Reactive to light, Extraocular movements intact, Fundi without hemorrhage or visible lesions but poorly seen., Conjunctiva with mild peripheral edness no discharge   Assessments/Plans  See after visit summary for details of patient instuctions  Bipolar 1 disorder Stable Check monitoring labs   Bilateral knee pain Stable.  Encourage activity   Dry eyes Worsened.  Refilled eye drops.  No signs of infection or iritis or glaucoma

## 2017-11-15 ENCOUNTER — Encounter: Payer: Self-pay | Admitting: Family Medicine

## 2017-11-15 LAB — CMP14+EGFR
ALBUMIN: 4.4 g/dL (ref 3.5–5.5)
ALK PHOS: 84 IU/L (ref 39–117)
ALT: 19 IU/L (ref 0–44)
AST: 22 IU/L (ref 0–40)
Albumin/Globulin Ratio: 2.1 (ref 1.2–2.2)
BILIRUBIN TOTAL: 0.3 mg/dL (ref 0.0–1.2)
BUN / CREAT RATIO: 11 (ref 9–20)
BUN: 10 mg/dL (ref 6–24)
CHLORIDE: 107 mmol/L — AB (ref 96–106)
CO2: 21 mmol/L (ref 20–29)
Calcium: 9.5 mg/dL (ref 8.7–10.2)
Creatinine, Ser: 0.91 mg/dL (ref 0.76–1.27)
GFR calc Af Amer: 114 mL/min/{1.73_m2} (ref >59)
GFR calc non Af Amer: 99 mL/min/{1.73_m2} (ref >59)
GLUCOSE: 99 mg/dL (ref 65–99)
Globulin, Total: 2.1 g/dL (ref 1.5–4.5)
Potassium: 4.1 mmol/L (ref 3.5–5.2)
Sodium: 139 mmol/L (ref 134–144)
Total Protein: 6.5 g/dL (ref 6.0–8.5)

## 2017-11-15 LAB — CBC
HEMATOCRIT: 38 % (ref 37.5–51.0)
Hemoglobin: 13 g/dL (ref 13.0–17.7)
MCH: 30.7 pg (ref 26.6–33.0)
MCHC: 34.2 g/dL (ref 31.5–35.7)
MCV: 90 fL (ref 79–97)
PLATELETS: 191 10*3/uL (ref 150–450)
RBC: 4.24 x10E6/uL (ref 4.14–5.80)
RDW: 13.5 % (ref 12.3–15.4)
WBC: 4.7 10*3/uL (ref 3.4–10.8)

## 2017-11-15 LAB — VITAMIN B12: VITAMIN B 12: 423 pg/mL (ref 232–1245)

## 2017-11-15 LAB — TSH: TSH: 2.46 u[IU]/mL (ref 0.450–4.500)

## 2017-11-15 LAB — FOLATE

## 2017-11-15 LAB — LITHIUM LEVEL: Lithium Lvl: 0.7 mmol/L (ref 0.6–1.2)

## 2017-11-20 ENCOUNTER — Other Ambulatory Visit: Payer: Self-pay | Admitting: Family Medicine

## 2017-11-25 ENCOUNTER — Encounter (HOSPITAL_COMMUNITY): Payer: Self-pay | Admitting: *Deleted

## 2017-11-25 ENCOUNTER — Other Ambulatory Visit: Payer: Self-pay

## 2017-11-25 ENCOUNTER — Emergency Department (HOSPITAL_COMMUNITY): Payer: Medicaid Other

## 2017-11-25 ENCOUNTER — Emergency Department (HOSPITAL_COMMUNITY)
Admission: EM | Admit: 2017-11-25 | Discharge: 2017-11-25 | Disposition: A | Discharge status: Home / Self Care | Payer: Medicaid Other | Attending: Emergency Medicine | Admitting: Emergency Medicine

## 2017-11-25 DIAGNOSIS — R059 Cough, unspecified: Secondary | ICD-10-CM

## 2017-11-25 DIAGNOSIS — R1111 Vomiting without nausea: Secondary | ICD-10-CM | POA: Insufficient documentation

## 2017-11-25 DIAGNOSIS — Z79899 Other long term (current) drug therapy: Secondary | ICD-10-CM | POA: Diagnosis not present

## 2017-11-25 DIAGNOSIS — R05 Cough: Secondary | ICD-10-CM | POA: Diagnosis not present

## 2017-11-25 DIAGNOSIS — Z87891 Personal history of nicotine dependence: Secondary | ICD-10-CM | POA: Diagnosis not present

## 2017-11-25 DIAGNOSIS — E039 Hypothyroidism, unspecified: Secondary | ICD-10-CM | POA: Insufficient documentation

## 2017-11-25 DIAGNOSIS — J45909 Unspecified asthma, uncomplicated: Secondary | ICD-10-CM | POA: Insufficient documentation

## 2017-11-25 LAB — CBC
HCT: 37.5 % — ABNORMAL LOW (ref 39.0–52.0)
Hemoglobin: 12.4 g/dL — ABNORMAL LOW (ref 13.0–17.0)
MCH: 30.5 pg (ref 26.0–34.0)
MCHC: 33.1 g/dL (ref 30.0–36.0)
MCV: 92.4 fL (ref 78.0–100.0)
PLATELETS: 194 10*3/uL (ref 150–400)
RBC: 4.06 MIL/uL — ABNORMAL LOW (ref 4.22–5.81)
RDW: 12.6 % (ref 11.5–15.5)
WBC: 9.1 10*3/uL (ref 4.0–10.5)

## 2017-11-25 LAB — COMPREHENSIVE METABOLIC PANEL
ALK PHOS: 75 U/L (ref 38–126)
ALT: 23 U/L (ref 0–44)
AST: 28 U/L (ref 15–41)
Albumin: 4.3 g/dL (ref 3.5–5.0)
Anion gap: 7 (ref 5–15)
BUN: 8 mg/dL (ref 6–20)
CALCIUM: 9.7 mg/dL (ref 8.9–10.3)
CO2: 27 mmol/L (ref 22–32)
CREATININE: 1.09 mg/dL (ref 0.61–1.24)
Chloride: 106 mmol/L (ref 98–111)
Glucose, Bld: 130 mg/dL — ABNORMAL HIGH (ref 70–99)
Potassium: 4 mmol/L (ref 3.5–5.1)
Sodium: 140 mmol/L (ref 135–145)
Total Bilirubin: 0.7 mg/dL (ref 0.3–1.2)
Total Protein: 6.8 g/dL (ref 6.5–8.1)

## 2017-11-25 LAB — URINALYSIS, ROUTINE W REFLEX MICROSCOPIC
Bilirubin Urine: NEGATIVE
GLUCOSE, UA: NEGATIVE mg/dL
HGB URINE DIPSTICK: NEGATIVE
Ketones, ur: NEGATIVE mg/dL
Leukocytes, UA: NEGATIVE
Nitrite: NEGATIVE
PROTEIN: NEGATIVE mg/dL
Specific Gravity, Urine: 1.014 (ref 1.005–1.030)
pH: 8 (ref 5.0–8.0)

## 2017-11-25 LAB — LIPASE, BLOOD: Lipase: 25 U/L (ref 11–51)

## 2017-11-25 MED ORDER — PANTOPRAZOLE SODIUM 20 MG PO TBEC: 20.0000 mg | DELAYED_RELEASE_TABLET | Freq: Every day | ORAL | 0 refills | Status: DC

## 2017-11-25 MED ORDER — PANTOPRAZOLE SODIUM 20 MG PO TBEC
20.0000 mg | DELAYED_RELEASE_TABLET | Freq: Once | ORAL | Status: AC
  Administered 2017-11-25: 20 mg via ORAL
  Filled 2017-11-25: qty 1

## 2017-11-25 NOTE — ED Notes (Signed)
Pt called ride [name] who confirmed he will pick pt up in lobby. Signature pad unavailable. Pt verbalized understanding.

## 2017-11-25 NOTE — ED Notes (Signed)
Patient transported to X-ray 

## 2017-11-25 NOTE — ED Provider Notes (Signed)
[facility] EMERGENCY DEPARTMENT Provider Note   CSN: [id number] Arrival date & time: 11/25/17  1827     History   Chief Complaint Chief Complaint  Patient presents with  . vomiting blood    HPI Daniel Holmes is a 47 y.o. male.  HPI Patient reports he gets vomiting.  He reports that he does not have any abdominal pain.  He will just vomit and sometimes he sees blood in it.  He denies constipation or difficulty with bowel movements.  He denies difficulty eating.  No Pain was eating.  He reports that he has cough and shortness of breath.  Is been ongoing for several days.  No fever no chills.  No lower extremity swelling or calf pain. Past Medical History:  Diagnosis Date  . Asthma   . Bipolar 1 disorder ([place])   . Mental developmental delay   . Psychosis [facility])    See Dr [name]    Patient Active Problem List   Diagnosis Date Noted  . High risk medication use 11/14/2017  . Dry eyes 11/14/2017  . Upper airway cough syndrome 03/01/2017  . Bilateral knee pain 02/08/2017  . Bipolar 1 disorder ([place]) 11/08/2014  . 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 Bipolar disorder with depression ([place]) 11/03/2014  . 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 History of ADHD 11/03/2014  . Other seasonal allergic rhinitis 11/05/2012  . Hypothyroidism 03/28/2012    Past Surgical History:  Procedure Laterality Date  . HERNIA REPAIR          Home Medications    Prior to Admission medications   Medication Sig Start Date End Date Taking? Authorizing Provider  acetaminophen (TYLENOL) 650 MG CR tablet Take 1 tablet (650 mg total) by mouth every 8 (eight) hours as needed for pain. 09/05/17   Lind Covert, MD  cloNIDine (CATAPRES) 0.1 MG tablet Take 1 tablet (0.1 mg total) by mouth 2 (two) times daily. 11/10/14   Pucilowska, Jolanta B, MD  cycloSPORINE (RESTASIS) 0.05 % ophthalmic emulsion Place 1 drop into both eyes 2 (two)  times daily as needed (DRY EYES).    [provider]  Diclofenac Sodium 3 % GEL Apply a small amount to left anterior knee 3 or 4 times a day for knee pain 02/07/17   Dickie La, MD  fluticasone [facility]) 50 MCG/ACT nasal spray INSTILL 2 SPRAYS INTO BOTH NOSTRILS ONCE A DAY 09/05/17   Lind Covert, MD  lamoTRIgine (LAMICTAL) 100 MG tablet Take 100 mg by mouth at bedtime.     [provider]  levothyroxine (SYNTHROID, LEVOTHROID) 75 MCG tablet TAKE (1) TABLET BY MOUTH ONCE DAILY BEFORE BREAKFAST. 11/20/17   Lind Covert, MD  lithium carbonate (ESKALITH) 450 MG CR tablet Take 1 tablet (450 mg total) by mouth every 12 (twelve) hours. 11/10/14   Pucilowska, Herma Ard B, MD  loratadine (CLARITIN) 10 MG tablet TAKE (1) TABLET BY MOUTH ONCE DAILY. 07/19/17   Lind Covert, MD  Multiple Vitamins-Minerals (MULTIVITAMIN ADULTS 50+) TABS Take 1 tablet by mouth daily. 11/10/14   Pucilowska, Jolanta B, MD  OLANZapine (ZYPREXA) 20 MG tablet Take 1.5 tablets (30 mg total) by mouth at bedtime. Take 1 and 1/2 tablets by mouth at bedtime. Patient taking differently: Take 20 mg by mouth at bedtime.  11/10/14   Pucilowska, Jolanta B, MD  Olopatadine HCl (PAZEO) 0.7 % SOLN Place 1 drop into both eyes daily as needed (every morning as needed  for allergies).    [provider]  omeprazole (PRILOSEC) 20 MG capsule Take 1 capsule (20 mg total) by mouth daily as needed. 06/22/17   Lind Covert, MD  pantoprazole (PROTONIX) 20 MG tablet Take 1 tablet (20 mg total) by mouth daily. 11/25/17   Charlesetta Shanks, MD  sodium chloride (OCEAN) 0.65 % SOLN nasal spray Place 2 sprays into both nostrils 4 (four) times daily. 07/03/17   Lind Covert, MD  traZODone (DESYREL) 150 MG tablet Take 1 tablet (150 mg total) by mouth at bedtime. 11/10/14   Pucilowska, Wardell Honour, MD  Vitamin D, Ergocalciferol, (DRISDOL) 50000 UNITS CAPS capsule Take 1 capsule (50,000 Units total) by mouth  every 14 (fourteen) days. 11/10/14   Pucilowska, Herma Ard B, MD  vitamin E 200 UNIT capsule Take 200 Units by mouth daily.    [provider]    Family History Family History  Family history unknown: Yes    Social History Social History   Tobacco Use  . Smoking status: Former Smoker    Packs/day: 1.00    Years: 10.00    Pack years: 10.00    Types: Cigarettes    Last attempt to quit: 05/29/1998    Years since quitting: 19.5  . Smokeless tobacco: Never Used  Substance Use Topics  . Alcohol use: No  . Drug use: No     Allergies   Bee pollen and Penicillins   Review of Systems Review of Systems 10 Systems reviewed and are negative for acute change except as noted in the HPI.   Physical Exam Updated Vital Signs BP 107/71   Pulse 86   Temp 97.8 F (36.6 C) (Oral)   Resp 18   SpO2 96%   Physical Exam  Constitutional: He is oriented to person, place, and time. He appears well-developed and well-nourished.  Patient is alert and nontoxic.  He is clinically well in appearance.  Color is good no respiratory distress.  Will nourished well-developed.  Has speech impediment that makes him somewhat difficult to understand but speaks with purpose.  HENT:  Head: Normocephalic and atraumatic.  Mouth/Throat: Oropharynx is clear and moist.  edentulous.  Posterior oropharynx widely patent.  Mucous memories pink and moist.  Eyes: EOM are normal.  Neck: Neck supple.  Cardiovascular: Normal rate, regular rhythm, normal heart sounds and intact distal pulses.  Pulmonary/Chest: Effort normal and breath sounds normal.  Abdominal: Soft. Bowel sounds are normal. He exhibits no distension. There is no tenderness.  Musculoskeletal: Normal range of motion. He exhibits no edema or tenderness.  Neurological: He is alert and oriented to person, place, and time. He has normal strength. He exhibits normal muscle tone. Coordination normal. GCS eye subscore is 4. GCS verbal subscore is 5. GCS  motor subscore is 6.  Skin: Skin is warm, dry and intact.  Psychiatric: He has a normal mood and affect.     ED Treatments / Results  Labs (all labs ordered are listed, but only abnormal results are displayed) Labs Reviewed  COMPREHENSIVE METABOLIC PANEL - Abnormal; Notable for the following components:      Result Value   Glucose, Bld 130 (*)    All other components within normal limits  CBC - Abnormal; Notable for the following components:   RBC 4.06 (*)    Hemoglobin 12.4 (*)    HCT 37.5 (*)    All other components within normal limits  URINALYSIS, ROUTINE W REFLEX MICROSCOPIC - Abnormal; Notable for the following components:  APPearance HAZY (*)    All other components within normal limits  LIPASE, BLOOD    EKG None  Radiology Dg Chest 2 View  Result Date: 11/25/2017 CLINICAL DATA:  Coughing up blood EXAM: CHEST - 2 VIEW COMPARISON:  06/20/2017 FINDINGS: The heart size and mediastinal contours are within normal limits. Both lungs are clear. The visualized skeletal structures are unremarkable. IMPRESSION: No active cardiopulmonary disease. Electronically Signed   By: Donavan Foil M.D.   On: 11/25/2017 22:54    Procedures Procedures (including critical care time)  Medications Ordered in ED Medications  pantoprazole (PROTONIX) EC tablet 20 mg (20 mg Oral Given 11/25/17 2254)     Initial Impression / Assessment and Plan / ED Course  I have reviewed the triage vital signs and the nursing notes.  Pertinent labs & imaging results that were available during my care of the patient were reviewed by me and considered in my medical decision making (see chart for details).      Final Clinical Impressions(s) / ED Diagnoses   Final diagnoses:  Vomiting without nausea, intractability of vomiting not specified, unspecified vomiting type  Cough   Patient is alert and well in appearance.  Reports vomiting and sometimes seeing blood.  No associated abdominal pain or pain  with eating.  Hemoglobin is stable.  Patient has stable vital signs.  Physical exam is normal.  She describes cough, nonproductive.  Review of EMR indicates patient has had problems with a cough syndrome.  Nothing today suggest pneumonia, PE or other serious pulmonary etiology.  Patient may be experiencing reflux symptoms.  Plan will be to initiate Protonix daily.  Return precautions reviewed.  Patient instructed to follow-up with PCP. ED Discharge Orders        Ordered    pantoprazole (PROTONIX) 20 MG tablet  Daily     11/25/17 2325       Charlesetta Shanks, MD 11/25/17 2339

## 2017-11-25 NOTE — ED Triage Notes (Signed)
The pt is c/o vomiting  Up blood since this am a dark in color  And he has difficulty breathing  None at present  Speech patternn is very hard to understand

## 2018-01-23 ENCOUNTER — Telehealth: Payer: Self-pay | Admitting: Family Medicine

## 2018-01-23 NOTE — Telephone Encounter (Signed)
Marshell Garfinkel - a NP at [facility] called requesting an updated copy of this patients med list. She said the last one we sent her was from 2017 and she needs a current one. Her fax number is [phone]. Please advise

## 2018-01-24 NOTE — Telephone Encounter (Signed)
Please inform her we will need a signed ROI in order to send his medication list Thanks  [place]

## 2018-01-25 NOTE — Telephone Encounter (Signed)
We do not have a number to contact [name], so I faxed a letter stating that we needed a ROI. Dat Derksen, Salome Spotted, CMA

## 2018-01-30 ENCOUNTER — Ambulatory Visit: Payer: Self-pay | Admitting: Family Medicine

## 2018-01-31 NOTE — Telephone Encounter (Signed)
Phone number to reach [place]: [phone].

## 2018-02-06 ENCOUNTER — Encounter: Payer: Self-pay | Admitting: Family Medicine

## 2018-02-06 ENCOUNTER — Ambulatory Visit: Payer: Medicaid Other | Admitting: Family Medicine

## 2018-02-06 ENCOUNTER — Other Ambulatory Visit: Payer: Self-pay

## 2018-02-06 VITALS — BP 92/68 | HR 71 | Temp 97.9°F | Ht 66.0 in | Wt 165.8 lb

## 2018-02-06 DIAGNOSIS — Z23 Encounter for immunization: Secondary | ICD-10-CM

## 2018-02-06 DIAGNOSIS — Z0000 Encounter for general adult medical examination without abnormal findings: Secondary | ICD-10-CM | POA: Diagnosis present

## 2018-02-06 MED ORDER — CARBAMIDE PEROXIDE 6.5 % OT SOLN: 3.0000 [drp] | Freq: Two times a day (BID) | OTIC | 0 refills | Status: DC

## 2018-02-06 NOTE — Patient Instructions (Signed)
Good to see you today!  Thanks for coming in.   You have wax in your left ear Use Debox softner - 3 drops daily for one week to soften  Come back in 6 months for recheck and blood tests

## 2018-02-06 NOTE — Progress Notes (Signed)
Subjective  Daniel Holmes is a 47 y.o. male is presenting for an physical exam  No complaints.    Chief Complaint noted Review of Symptoms - see HPI PMH - Smoking status noted.    Objective Vital Signs reviewed BP 92/68   Pulse 71   Temp 97.9 F (36.6 C) (Oral)   Ht 5\' 6"  (1.676 m)   Wt 165 lb 12.8 oz (75.2 kg)   SpO2 98%   BMI 26.76 kg/m   Neck:  No deformities, thyromegaly, masses, or tenderness noted.   Supple with full range of motion without pain. Heart - Regular rate and rhythm.  No murmurs, gallops or rubs.    Lungs:  Normal respiratory effort, chest expands symmetrically. Lungs are clear to auscultation, no crackles or wheezes. Abdomen: soft and non-tender without masses, organomegaly or hernias noted.  No guarding or rebound Skin:  Intact without suspicious lesions or rashes Extremities:  No cyanosis, edema, or deformity noted with good range of motion of all major joints.   Mobility:able to get up and down from exam table without assistance or distress Ears - L canal 80% occluded with wax  Assessments/Plans Normal exam  Blood work up to date    See after visit summary for details of patient instuctions  No problem-specific Assessment & Plan notes found for this encounter.

## 2018-04-18 ENCOUNTER — Ambulatory Visit (INDEPENDENT_AMBULATORY_CARE_PROVIDER_SITE_OTHER): Payer: Medicaid Other | Admitting: Family Medicine

## 2018-04-18 ENCOUNTER — Encounter: Payer: Self-pay | Admitting: Family Medicine

## 2018-04-18 VITALS — BP 115/70 | HR 65 | Temp 97.8°F | Ht 66.0 in | Wt 157.8 lb

## 2018-04-18 DIAGNOSIS — Z0000 Encounter for general adult medical examination without abnormal findings: Secondary | ICD-10-CM | POA: Diagnosis not present

## 2018-04-18 DIAGNOSIS — J302 Other seasonal allergic rhinitis: Secondary | ICD-10-CM

## 2018-04-18 NOTE — Progress Notes (Addendum)
S 

## 2018-04-18 NOTE — Assessment & Plan Note (Signed)
Stop seasonal loratadine and flonase and monitor for symptoms

## 2018-04-18 NOTE — Progress Notes (Signed)
Subjective  Daniel Holmes is a 47 y.o. male is presenting for a physical  Feels well except for mild knee and back pain due to arthritis.  Tylenol and topical NSAIDs help with this  Patient reports no  vision/ hearing changes,anorexia, weight change, fever ,adenopathy, persistant / recurrent hoarseness, swallowing issues, chest pain, edema,persistant / recurrent cough, hemoptysis, dyspnea(rest, exertional, paroxysmal nocturnal), gastrointestinal  bleeding (melena, rectal bleeding), abdominal pain, excessive heart burn, GU symptoms(dysuria, hematuria, pyuria, voiding/incontinence  Issues) syncope, focal weakness, severe memory loss, concerning skin lesions, depression, anxiety, abnormal bruising/bleeding, major joint swelling.     Chief Complaint noted Review of Symptoms - see HPI PMH - Smoking status noted.    Objective Vital Signs reviewed BP 115/70 (BP Location: Left Arm, Patient Position: Sitting, Cuff Size: Normal)   Pulse 65   Temp 97.8 F (36.6 C) (Oral)   Ht 5\' 6"  (1.676 m)   Wt 157 lb 12.8 oz (71.6 kg)   SpO2 98%   BMI 25.47 kg/m  Ears:  External ear exam shows no significant lesions or deformities.  Otoscopic examination reveals clear canals, tympanic membranes are intact bilaterally without bulging, retraction, inflammation or discharge. Hearing is grossly normal bilaterall Eye - Pupils Equal Round Reactive to light, Extraocular movements intact,  Conjunctiva without redness or discharge.  Blinks frequenlty Nose:  External nasal examination shows no deformity or inflammation. Nasal mucosa are pink and moist without lesions or exudates. No septal dislocation or dislocation.No obstruction to airflow. Mouth - no lesions, mucous membranes are moist, no decaying teeth   Neck:  No deformities, thyromegaly, masses, or tenderness noted.   Supple with full range of motion without pain. Heart - Regular rate and rhythm.  No murmurs, gallops or rubs.    Lungs:  Normal respiratory effort,  chest expands symmetrically. Lungs are clear to auscultation, no crackles or wheezes. Abdomen: soft and non-tender without masses, organomegaly or hernias noted.  No guarding or rebound Extremities:  No cyanosis, edema, or deformity noted with good range of motion of all major joints.   Skin:  Intact without suspicious lesions or rashes  Assessments/Plans  See after visit summary for details of patient instuctions  Normal exam Screening monitoring blood work is up to date   Other seasonal allergic rhinitis Stop seasonal loratadine and flonase and monitor for symptoms

## 2018-04-18 NOTE — Patient Instructions (Addendum)
Good to see you today!  Thanks for coming in.  Stop the following Medication - Vitamin D - Loratadine  - Flonase nasal spray  Come back in 6 months for a thyroid check

## 2018-05-15 ENCOUNTER — Telehealth: Payer: Self-pay | Admitting: Family Medicine

## 2018-05-15 DIAGNOSIS — E038 Other specified hypothyroidism: Secondary | ICD-10-CM

## 2018-05-15 DIAGNOSIS — Z79899 Other long term (current) drug therapy: Secondary | ICD-10-CM

## 2018-05-15 NOTE — Telephone Encounter (Signed)
Patient's caretaker came in with a request from patient's psychiatrist, Aura Fey, for lab results.   Requesting lab results to be faxed to [phone].  Patient's caretaker is Jacklynn Lewis and can be reached at [phone] for any questions   Request placed in red folder in front office

## 2018-05-15 NOTE — Telephone Encounter (Signed)
Reviewed request and will place in PCP's box.  Daniel Holmes, [place]

## 2018-05-17 NOTE — Telephone Encounter (Signed)
Caretaker calling back to follow up on these lab results. Please call her back with any updates.

## 2018-05-20 NOTE — Telephone Encounter (Signed)
Called and spoke to patient's caregiver Jacklynn Lewis concerning labs. Informed her that future labs were placed today for TSH, A1C, and Lithium levels as patient has not had any labs since 11/14/2017.  Dinah Beers states that Aura Fey still wants to see what we have on file so that she can adjust patient's medication accordingly.  I will print and fax the last lab results from 11/14/2017 to [phone].  Ozella Almond, [place]

## 2018-05-20 NOTE — Telephone Encounter (Signed)
Please let the caretaker know he has not had labs for 6 months so he will need to come in for a lab appointment.  I ordered the labs as future  Thanks  LC

## 2018-08-13 ENCOUNTER — Other Ambulatory Visit: Payer: Self-pay | Admitting: Family Medicine

## 2018-08-14 ENCOUNTER — Other Ambulatory Visit: Payer: Self-pay | Admitting: Family Medicine

## 2018-08-15 ENCOUNTER — Other Ambulatory Visit: Payer: Self-pay | Admitting: Family Medicine

## 2018-10-07 ENCOUNTER — Other Ambulatory Visit: Payer: Self-pay | Admitting: Family Medicine

## 2018-10-15 ENCOUNTER — Other Ambulatory Visit: Payer: Self-pay

## 2018-10-15 ENCOUNTER — Encounter: Payer: Self-pay | Admitting: Family Medicine

## 2018-10-15 ENCOUNTER — Ambulatory Visit (INDEPENDENT_AMBULATORY_CARE_PROVIDER_SITE_OTHER): Payer: Medicaid Other | Admitting: Family Medicine

## 2018-10-15 DIAGNOSIS — Z79899 Other long term (current) drug therapy: Secondary | ICD-10-CM | POA: Diagnosis not present

## 2018-10-15 DIAGNOSIS — E038 Other specified hypothyroidism: Secondary | ICD-10-CM | POA: Diagnosis present

## 2018-10-15 DIAGNOSIS — M25561 Pain in right knee: Secondary | ICD-10-CM | POA: Diagnosis not present

## 2018-10-15 DIAGNOSIS — G8929 Other chronic pain: Secondary | ICD-10-CM | POA: Diagnosis not present

## 2018-10-15 DIAGNOSIS — M25562 Pain in left knee: Secondary | ICD-10-CM

## 2018-10-15 NOTE — Assessment & Plan Note (Signed)
Mild DJD.  Continue as needed analgesics

## 2018-10-15 NOTE — Assessment & Plan Note (Signed)
Will check lipids. 

## 2018-10-15 NOTE — Assessment & Plan Note (Signed)
Losing weight but reports is voluntary.  Will check labs

## 2018-10-15 NOTE — Progress Notes (Signed)
Subjective  Daniel Holmes is a 48 y.o. male is presenting with the following  ARTHRITIS Disease monitoring Most involved joints: knees and back.   Joint swelling: no    Joint Redness: no  Limitations: hurts some when he works but does not limit his activity  Medication Management Compliance:  Using tylenol and diclofenac gel prn   Abdominal pain: no   GI Bleeding:  no    HYPOTHYROIDISM Disease Monitoring Weight changes: losing weight trying since he turned 48  Skin Changes: n Palpitations: no Heat/Cold intolerance: no Duration - years  Timing - continuous  Severity - controlled  Medication Monitoring (modifying factors) Compliance:  Daily levothyroxine   Last TSH:   Lab Results  Component Value Date   TSH 2.460 11/14/2017    HIGH RISK MEDs - Antipsychotics Updated his medication list.  Has an A1c in April was normal but triglycerides were high.  No abdomen pain or chest pain   Chief Complaint noted Review of Symptoms - see HPI PMH - Smoking status noted.    Objective Vital Signs reviewed BP 102/80 (BP Location: Right Arm, Patient Position: Sitting, Cuff Size: Normal)   Pulse 81   Wt 149 lb (67.6 kg)   SpO2 98%   BMI 24.05 kg/m  Good range of motion of both knees and back.  Wearing knee sleeves  Assessments/Plans  See after visit summary for details of patient instuctions  High risk medication use Will check lipids   Hypothyroidism Losing weight but reports is voluntary.  Will check labs   Bilateral knee pain Mild DJD.  Continue as needed analgesics   Told would set up for colonoscopy in the Fall after Covid

## 2018-10-15 NOTE — Patient Instructions (Signed)
Good to see you  I will call you if your tests are not good.  Otherwise I will send you a letter.  If you do not hear from me with in 2 weeks please call our office.     Use tylenol and gel as needed for knee pain  Come back in 6 months for a check up

## 2018-10-16 ENCOUNTER — Encounter: Payer: Self-pay | Admitting: Family Medicine

## 2018-10-16 LAB — LIPID PANEL
Chol/HDL Ratio: 4.3 ratio (ref 0.0–5.0)
Cholesterol, Total: 200 mg/dL — ABNORMAL HIGH (ref 100–199)
HDL: 46 mg/dL (ref >39)
LDL Calculated: 128 mg/dL — ABNORMAL HIGH (ref 0–99)
Triglycerides: 131 mg/dL (ref 0–149)
VLDL Cholesterol Cal: 26 mg/dL (ref 5–40)

## 2018-10-16 LAB — TSH: TSH: 0.678 u[IU]/mL (ref 0.450–4.500)

## 2018-10-16 LAB — T4, FREE: Free T4: 1.13 ng/dL (ref 0.82–1.77)

## 2019-01-07 ENCOUNTER — Other Ambulatory Visit: Payer: Self-pay | Admitting: Family Medicine

## 2019-01-23 ENCOUNTER — Other Ambulatory Visit: Payer: Self-pay | Admitting: Family Medicine

## 2019-01-23 DIAGNOSIS — R058 Other specified cough: Secondary | ICD-10-CM

## 2019-01-23 DIAGNOSIS — M25562 Pain in left knee: Secondary | ICD-10-CM

## 2019-01-23 DIAGNOSIS — G8929 Other chronic pain: Secondary | ICD-10-CM

## 2019-01-23 DIAGNOSIS — R05 Cough: Secondary | ICD-10-CM

## 2019-01-24 ENCOUNTER — Other Ambulatory Visit: Payer: Self-pay | Admitting: Family Medicine

## 2019-01-24 MED ORDER — OMEPRAZOLE 20 MG PO CPDR: 20.0000 mg | DELAYED_RELEASE_CAPSULE | Freq: Every day | ORAL | 3 refills | Status: DC

## 2019-01-24 MED ORDER — ACETAMINOPHEN ER 650 MG PO TBCR: 650.0000 mg | EXTENDED_RELEASE_TABLET | Freq: Three times a day (TID) | ORAL | 6 refills | Status: DC | PRN

## 2019-02-24 ENCOUNTER — Other Ambulatory Visit: Payer: Self-pay | Admitting: Family Medicine

## 2019-04-17 ENCOUNTER — Telehealth: Payer: Self-pay | Admitting: Family Medicine

## 2019-04-17 NOTE — Telephone Encounter (Signed)
Reviewed [facility] FL2 form and placed in PCP's box for completion.  Daniel Holmes, [place]

## 2019-04-17 NOTE — Telephone Encounter (Signed)
Patient came in to drop off form to be completed by his PCP for his home FL2 form, pt has a physical scheduled for 11-25 but needs the form completed by 11-21 because that was his last physical date in 2019. Forms were placed in red team folder to be completed by PCP. Please inform patient when paperwork is complete.

## 2019-04-18 NOTE — Telephone Encounter (Signed)
Filled out  Returned to [employer] team

## 2019-04-18 NOTE — Telephone Encounter (Signed)
Caregiver was calling to see if the forms were ready to pick up yet?

## 2019-04-23 ENCOUNTER — Encounter: Payer: Self-pay | Admitting: Family Medicine

## 2019-04-23 ENCOUNTER — Other Ambulatory Visit: Payer: Self-pay

## 2019-04-23 ENCOUNTER — Ambulatory Visit (INDEPENDENT_AMBULATORY_CARE_PROVIDER_SITE_OTHER): Payer: Medicaid Other | Admitting: Family Medicine

## 2019-04-23 VITALS — BP 98/62 | HR 84 | Wt 148.4 lb

## 2019-04-23 DIAGNOSIS — M25561 Pain in right knee: Secondary | ICD-10-CM

## 2019-04-23 DIAGNOSIS — E038 Other specified hypothyroidism: Secondary | ICD-10-CM

## 2019-04-23 DIAGNOSIS — G8929 Other chronic pain: Secondary | ICD-10-CM | POA: Diagnosis not present

## 2019-04-23 DIAGNOSIS — Z23 Encounter for immunization: Secondary | ICD-10-CM

## 2019-04-23 DIAGNOSIS — M25562 Pain in left knee: Secondary | ICD-10-CM

## 2019-04-23 DIAGNOSIS — Z1211 Encounter for screening for malignant neoplasm of colon: Secondary | ICD-10-CM | POA: Diagnosis not present

## 2019-04-23 NOTE — Patient Instructions (Addendum)
Good to see you today!  Thanks for coming in.  Go through his medication list to make sure it is correct  They should contact you about getting a colonoscopy  Come back in one year for a physical

## 2019-04-23 NOTE — Progress Notes (Signed)
Subjective  Daniel Holmes is a 48 y.o. male is presenting for a physical.  He nor caretaker have any complaints  Patient reports no  vision/ hearing changes,anorexia, weight change, fever ,adenopathy, persistant / recurrent hoarseness, swallowing issues, chest pain, edema,persistant / recurrent cough, hemoptysis, dyspnea(rest, exertional, paroxysmal nocturnal), gastrointestinal  bleeding (melena, rectal bleeding), abdominal pain, excessive heart burn, GU symptoms(dysuria, hematuria, pyuria, voiding/incontinence  Issues) syncope, focal weakness, severe memory loss, concerning skin lesions, depression, anxiety, abnormal bruising/bleeding, major joint swelling.     Chief Complaint noted Review of Symptoms - see HPI PMH - Smoking status noted.    Objective Vital Signs reviewed BP 98/62   Pulse 84   Wt 148 lb 6.4 oz (67.3 kg)   SpO2 99%   BMI 23.95 kg/m  Heart - Regular rate and rhythm.  No murmurs, gallops or rubs.    Lungs:  Normal respiratory effort, chest expands symmetrically. Lungs are clear to auscultation, no crackles or wheezes. Abdomen: soft and non-tender without masses, organomegaly or hernias noted.  No guarding or rebound Extremities:  No cyanosis, edema, or deformity noted with good range of motion of all major joints.   Neurologic exam : Able to walk on heels and toes and deep knee bend Balance normal    Assessments/Plans Discussed PSA and USPSTF recommendations.  They decline Referred for colonoscopy   Bilateral knee pain Controlled on current regimen   Hypothyroidism TSH at goal   The 10-year ASCVD risk score Mikey Bussing DC Brooke Bonito., et al., 2013) is: 2.4%   Values used to calculate the score:     Age: 23 years     Sex: Male     Is Non-Hispanic African American: No     Diabetic: No     Tobacco smoker: No     Systolic Blood Pressure: 98 mmHg     Is BP treated: No     HDL Cholesterol: 46 mg/dL     Total Cholesterol: 200 mg/dL   See after visit summary for details  of patient instructions

## 2019-04-23 NOTE — Assessment & Plan Note (Signed)
Controlled on current regimen.   

## 2019-04-23 NOTE — Assessment & Plan Note (Signed)
TSH at goal.

## 2019-04-28 ENCOUNTER — Other Ambulatory Visit: Payer: Self-pay | Admitting: Family Medicine

## 2019-05-07 ENCOUNTER — Other Ambulatory Visit: Payer: Self-pay | Admitting: Family Medicine

## 2019-05-07 DIAGNOSIS — R058 Other specified cough: Secondary | ICD-10-CM

## 2019-05-07 DIAGNOSIS — R05 Cough: Secondary | ICD-10-CM

## 2019-05-26 ENCOUNTER — Encounter: Payer: Self-pay | Admitting: Family Medicine

## 2019-06-06 IMAGING — CR DG CHEST 2V
2 series · 2 of 2 positions shown · non-contrast
Comparison: PA and lateral chest x-ray March 01, 2017

CLINICAL DATA: Cough and shortness of breath for the past 2 weeks.
Former smoker.

EXAM:
CHEST  2 VIEW

[w chest pa]
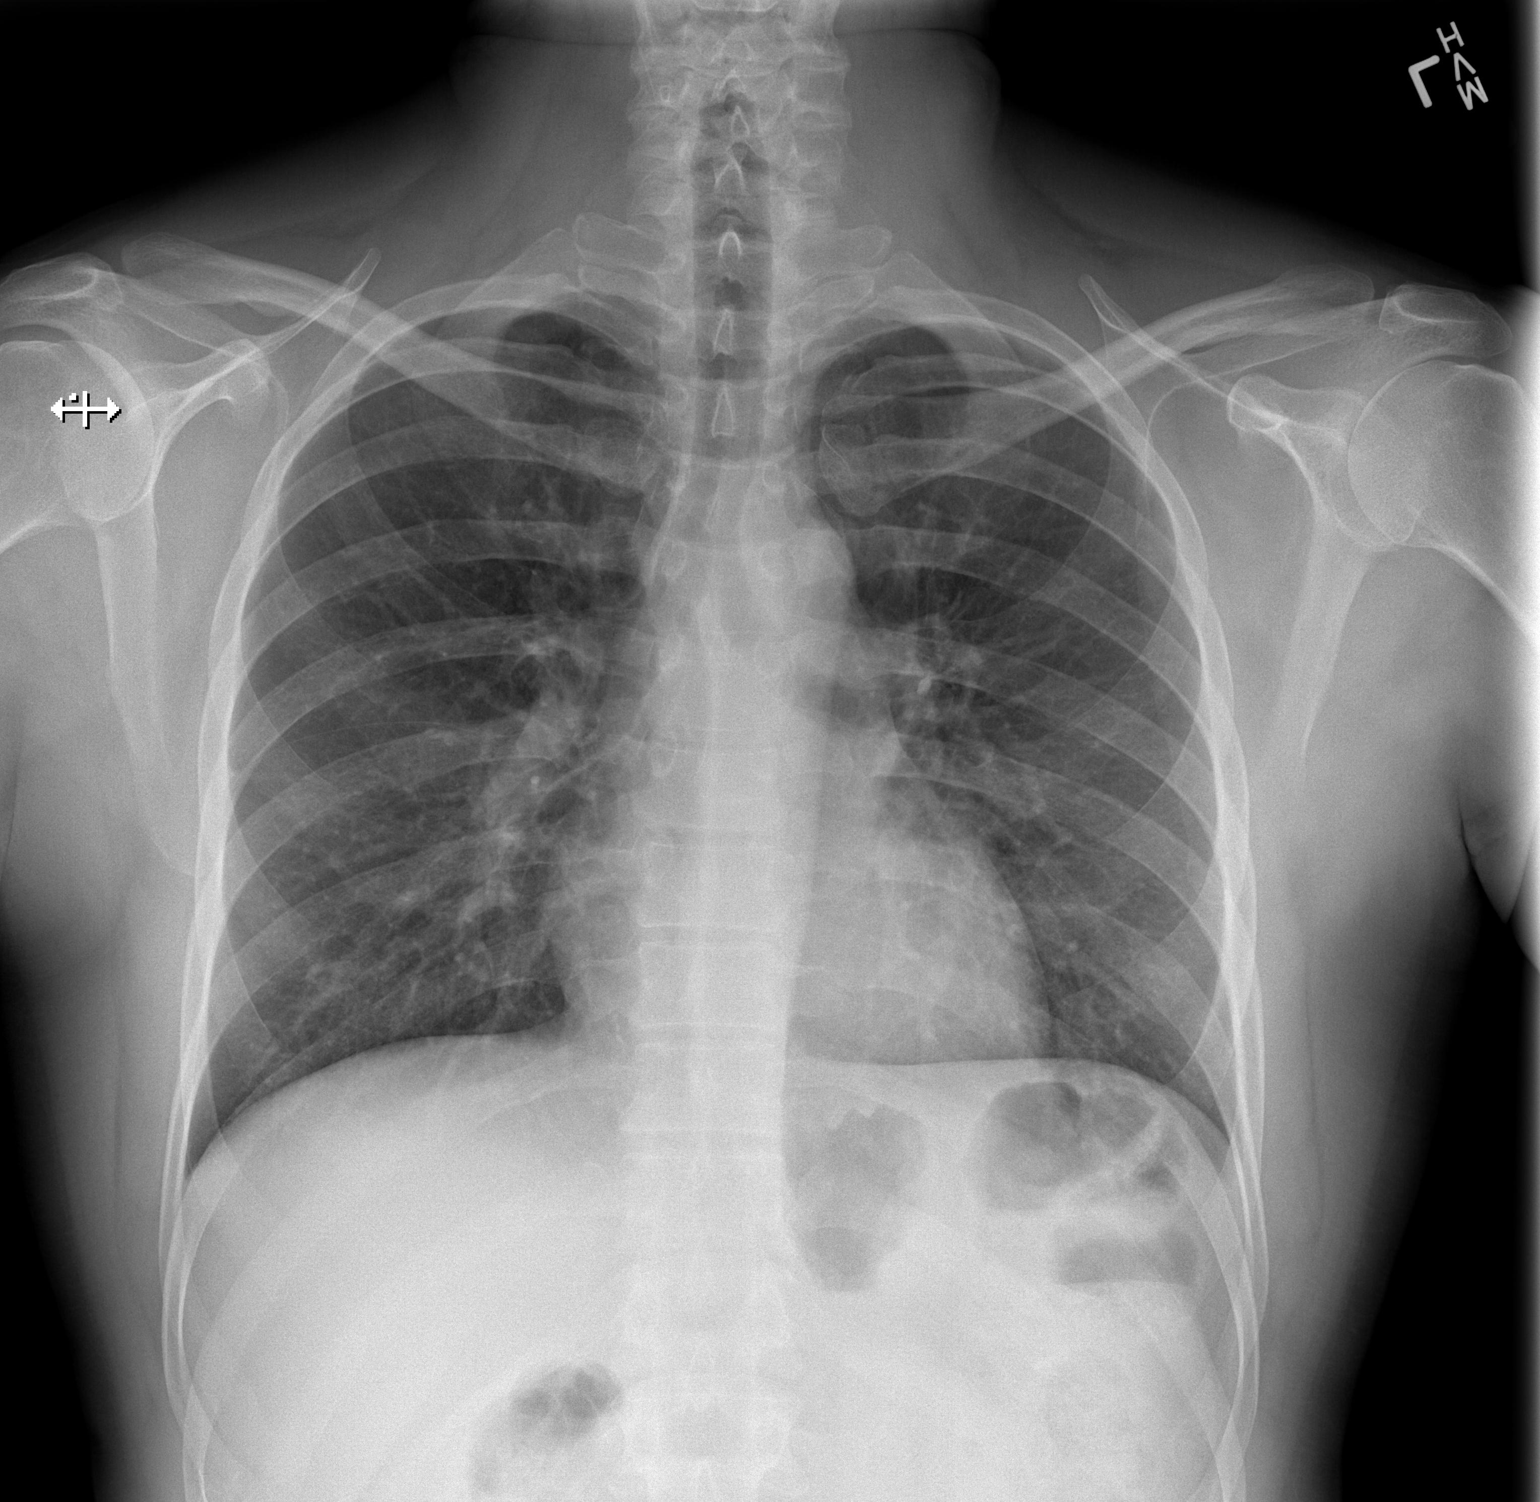

[w chest lat]
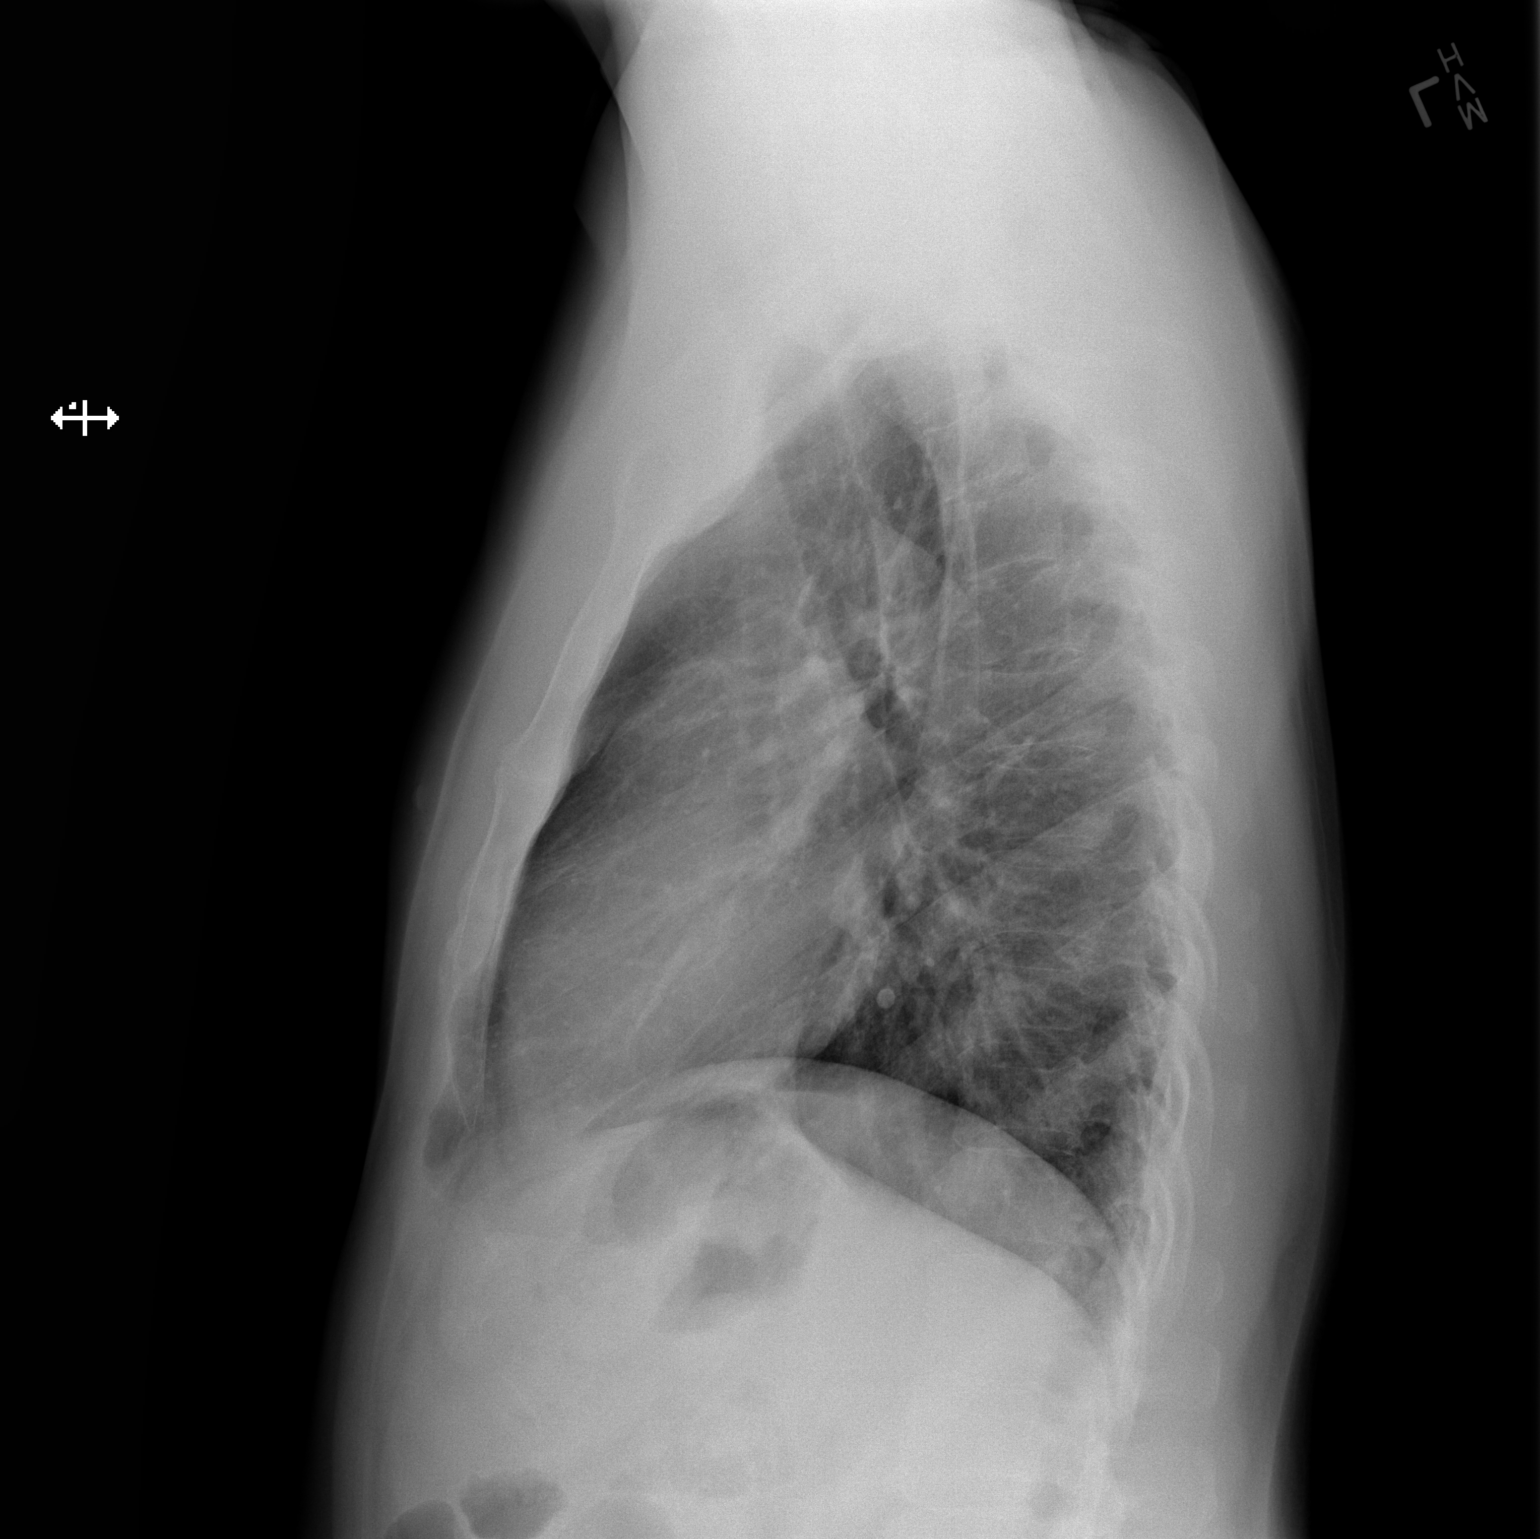

[2 of 2 positions shown; findings below may reference images not displayed]

FINDINGS: The lungs are adequately inflated. The interstitial markings are
coarse. There is no alveolar infiltrate or pleural effusion. The
heart and pulmonary vascularity are normal. The mediastinum is
normal in width. The bony thorax exhibits no acute abnormality.
IMPRESSION: Mild chronic bronchitic changes, stable. No acute cardiopulmonary
abnormality.

## 2019-08-26 ENCOUNTER — Other Ambulatory Visit: Payer: Self-pay

## 2019-08-26 ENCOUNTER — Encounter: Payer: Self-pay | Admitting: Gastroenterology

## 2019-08-26 ENCOUNTER — Ambulatory Visit (INDEPENDENT_AMBULATORY_CARE_PROVIDER_SITE_OTHER): Payer: Medicaid Other | Admitting: Family Medicine

## 2019-08-26 ENCOUNTER — Encounter: Payer: Self-pay | Admitting: Family Medicine

## 2019-08-26 DIAGNOSIS — E039 Hypothyroidism, unspecified: Secondary | ICD-10-CM | POA: Diagnosis not present

## 2019-08-26 DIAGNOSIS — Z0001 Encounter for general adult medical examination with abnormal findings: Secondary | ICD-10-CM

## 2019-08-26 DIAGNOSIS — M25562 Pain in left knee: Secondary | ICD-10-CM

## 2019-08-26 DIAGNOSIS — G8929 Other chronic pain: Secondary | ICD-10-CM

## 2019-08-26 DIAGNOSIS — E038 Other specified hypothyroidism: Secondary | ICD-10-CM

## 2019-08-26 DIAGNOSIS — M25561 Pain in right knee: Secondary | ICD-10-CM | POA: Diagnosis not present

## 2019-08-26 NOTE — Patient Instructions (Addendum)
Good to see you today!  Thanks for coming in.  For the back and knees  Use aspercreme as needed  Use Tylenol one three times a day as needed  You can get knee sleeves for your knees  You can get a back brace but wear ONLY when working and lifting  You need a colonoscopy to check for colon cancer - call  [phone] Say you need to get set up for a screening colonoscopy - they already have a referral from Dr Erin Hearing   You need to talk with your psychiatrist about sleeping   Get you covid vaccine ASAP  Come back in 3-4 months for blood tests

## 2019-08-26 NOTE — Progress Notes (Signed)
    SUBJECTIVE:   CHIEF COMPLAINT / HPI:   Here for physical  Overall feels well  Patient reports no  vision/ hearing changes,anorexia, weight change, fever ,adenopathy, persistant / recurrent hoarseness, swallowing issues, chest pain, edema,persistant / recurrent cough, hemoptysis, dyspnea(rest, exertional, paroxysmal nocturnal), gastrointestinal  bleeding (melena, rectal bleeding), abdominal pain, excessive heart burn, GU symptoms(dysuria, hematuria, pyuria, voiding/incontinence  Issues) syncope, focal weakness, severe memory loss, concerning skin lesions, depression, anxiety, abnormal bruising/bleeding, major joint swelling.     PERTINENT  PMH / PSH: lives in group home.  Sees psychiatry regularly  OBJECTIVE:   BP 102/62   Pulse 72   Ht 5\' 6"  (1.676 m)   Wt 161 lb 3.2 oz (73.1 kg)   SpO2 98%   BMI 26.02 kg/m   Alert talkative with speech impediment Heart - Regular rate and rhythm.  No murmurs, gallops or rubs.    Lungs:  Normal respiratory effort, chest expands symmetrically. Lungs are clear to auscultation, no crackles or wheezes. Abdomen: soft and non-tender without masses, organomegaly or hernias noted.  No guarding or rebound Extremities:  No cyanosis, edema, or deformity noted with good range of motion of all major joints.   Skin:  Intact without suspicious lesions or rashes Knees - no effusion or redness. FROM  Can bend and touch toes  ASSESSMENT/PLAN:   Wellness - recommend colon cancer screening.  See after visit summary   Bilateral knee pain Stable. Recommend sleeves as needed and continue current medications   Hypothyroidism Lab Results  Component Value Date   TSH 0.678 10/15/2018   Stable      Lind Covert, MD [place]

## 2019-08-26 NOTE — Assessment & Plan Note (Signed)
Lab Results  Component Value Date   TSH 0.678 10/15/2018   Stable

## 2019-08-26 NOTE — Assessment & Plan Note (Signed)
Stable. Recommend sleeves as needed and continue current medications

## 2019-09-15 ENCOUNTER — Other Ambulatory Visit: Payer: Self-pay | Admitting: Family Medicine

## 2019-09-18 ENCOUNTER — Other Ambulatory Visit: Payer: Self-pay | Admitting: Family Medicine

## 2019-09-28 ENCOUNTER — Emergency Department (HOSPITAL_COMMUNITY)
Admission: EM | Admit: 2019-09-28 | Discharge: 2019-09-29 | Disposition: A | Discharge status: Home / Self Care | Payer: Medicaid Other | Attending: Emergency Medicine | Admitting: Emergency Medicine

## 2019-09-28 ENCOUNTER — Emergency Department (HOSPITAL_COMMUNITY): Payer: Medicaid Other

## 2019-09-28 ENCOUNTER — Encounter (HOSPITAL_COMMUNITY): Payer: Self-pay

## 2019-09-28 ENCOUNTER — Other Ambulatory Visit: Payer: Self-pay

## 2019-09-28 DIAGNOSIS — Z20822 Contact with and (suspected) exposure to covid-19: Secondary | ICD-10-CM | POA: Diagnosis not present

## 2019-09-28 DIAGNOSIS — J45909 Unspecified asthma, uncomplicated: Secondary | ICD-10-CM | POA: Diagnosis not present

## 2019-09-28 DIAGNOSIS — R079 Chest pain, unspecified: Secondary | ICD-10-CM | POA: Diagnosis not present

## 2019-09-28 DIAGNOSIS — E039 Hypothyroidism, unspecified: Secondary | ICD-10-CM | POA: Diagnosis not present

## 2019-09-28 DIAGNOSIS — Z79899 Other long term (current) drug therapy: Secondary | ICD-10-CM | POA: Diagnosis not present

## 2019-09-28 DIAGNOSIS — F70 Mild intellectual disabilities: Secondary | ICD-10-CM | POA: Diagnosis not present

## 2019-09-28 DIAGNOSIS — Z87891 Personal history of nicotine dependence: Secondary | ICD-10-CM | POA: Insufficient documentation

## 2019-09-28 LAB — TROPONIN I (HIGH SENSITIVITY)
Troponin I (High Sensitivity): <2 ng/L (ref <18)
Troponin I (High Sensitivity): <2 ng/L (ref <18)

## 2019-09-28 LAB — COMPREHENSIVE METABOLIC PANEL
ALT: 27 U/L (ref 0–44)
AST: 30 U/L (ref 15–41)
Albumin: 4.6 g/dL (ref 3.5–5.0)
Alkaline Phosphatase: 80 U/L (ref 38–126)
Anion gap: 9 (ref 5–15)
BUN: 10 mg/dL (ref 6–20)
CO2: 25 mmol/L (ref 22–32)
Calcium: 9.6 mg/dL (ref 8.9–10.3)
Chloride: 105 mmol/L (ref 98–111)
Creatinine, Ser: 0.88 mg/dL (ref 0.61–1.24)
GFR calc Af Amer: >60 mL/min (ref >60)
GFR calc non Af Amer: >60 mL/min (ref >60)
Glucose, Bld: 108 mg/dL — ABNORMAL HIGH (ref 70–99)
Potassium: 3.9 mmol/L (ref 3.5–5.1)
Sodium: 139 mmol/L (ref 135–145)
Total Bilirubin: 0.7 mg/dL (ref 0.3–1.2)
Total Protein: 7.2 g/dL (ref 6.5–8.1)

## 2019-09-28 LAB — CBC WITH DIFFERENTIAL/PLATELET
Abs Immature Granulocytes: 0.06 10*3/uL (ref 0.00–0.07)
Basophils Absolute: 0 10*3/uL (ref 0.0–0.1)
Basophils Relative: 0 %
Eosinophils Absolute: 0 10*3/uL (ref 0.0–0.5)
Eosinophils Relative: 0 %
HCT: 37.5 % — ABNORMAL LOW (ref 39.0–52.0)
Hemoglobin: 12.6 g/dL — ABNORMAL LOW (ref 13.0–17.0)
Immature Granulocytes: 1 %
Lymphocytes Relative: 11 %
Lymphs Abs: 1.2 10*3/uL (ref 0.7–4.0)
MCH: 31.7 pg (ref 26.0–34.0)
MCHC: 33.6 g/dL (ref 30.0–36.0)
MCV: 94.5 fL (ref 80.0–100.0)
Monocytes Absolute: 0.4 10*3/uL (ref 0.1–1.0)
Monocytes Relative: 4 %
Neutro Abs: 8.8 10*3/uL — ABNORMAL HIGH (ref 1.7–7.7)
Neutrophils Relative %: 84 %
Platelets: 146 10*3/uL — ABNORMAL LOW (ref 150–400)
RBC: 3.97 MIL/uL — ABNORMAL LOW (ref 4.22–5.81)
RDW: 12.1 % (ref 11.5–15.5)
WBC: 10.5 10*3/uL (ref 4.0–10.5)
nRBC: 0 % (ref 0.0–0.2)

## 2019-09-28 LAB — RESPIRATORY PANEL BY RT PCR (FLU A&B, COVID)
Influenza A by PCR: NEGATIVE
Influenza B by PCR: NEGATIVE
SARS Coronavirus 2 by RT PCR: NEGATIVE

## 2019-09-28 MED ORDER — ONDANSETRON HCL 4 MG/2ML IJ SOLN
4.0000 mg | Freq: Once | INTRAMUSCULAR | Status: AC
  Administered 2019-09-28: 4 mg via INTRAVENOUS
  Filled 2019-09-28: qty 2

## 2019-09-28 MED ORDER — SODIUM CHLORIDE 0.9 % IV BOLUS
1000.0000 mL | Freq: Once | INTRAVENOUS | Status: AC
  Administered 2019-09-28: 1000 mL via INTRAVENOUS

## 2019-09-28 NOTE — ED Notes (Signed)
Caregiver reports testing positive for Covid-19 Friday. Patient had a negative result on Friday.

## 2019-09-28 NOTE — ED Provider Notes (Signed)
[facility] DEPT Provider Note   CSN: [id number] Arrival date & time: 09/28/19  2011     History Chief Complaint  Patient presents with  . Chest Pain    Daniel Holmes is a 49 y.o. male past medical history bipolar, mental developmental delay who presents with his caregiver who presents for evaluation of chest pain that began while at home.  He describes a sharp pain in the middle of his chest.  He states it has been constant since this afternoon.  He does feel like it is worse with walking.  He also reports that he has had some intermittent shortness of breath.  He has had some nausea/vomiting.  He states he feels lightheaded.  He has been coughing over the last few days.  Caregiver is with him who stated that they both recently were tested for Covid.  The patient's came back negative but the caregivers test came back positive.  He has still been around the caregiver.  No fevers, abdominal pain, numbness/weakness of his arms or legs, vision changes.  He does not smoke.  No personal history of MI.  No family history of MI before the age of 41.  No history of hypertension or diabetes.  No drug use.  The history is provided by the patient.       Past Medical History:  Diagnosis Date  . Asthma   . Bipolar 1 disorder ([place])   . Mental developmental delay   . Psychosis [facility])    See Dr [name]    Patient Active Problem List   Diagnosis Date Noted  . High risk medication use 11/14/2017  . Dry eyes 11/14/2017  . Upper airway cough syndrome 03/01/2017  . Bilateral knee pain 02/08/2017  . Bipolar 1 disorder ([place]) 11/08/2014  . 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 Bipolar disorder with depression ([place]) 11/03/2014  . 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 History of ADHD 11/03/2014  . Other seasonal allergic rhinitis 11/05/2012  . Hypothyroidism 03/28/2012    Past Surgical History:  Procedure Laterality Date  .  HERNIA REPAIR         Family History  Family history unknown: Yes    Social History   Tobacco Use  . Smoking status: Former Smoker    Packs/day: 1.00    Years: 10.00    Pack years: 10.00    Types: Cigarettes    Quit date: 05/29/1998    Years since quitting: 21.3  . Smokeless tobacco: Never Used  Substance Use Topics  . Alcohol use: No  . Drug use: No    Home Medications Prior to Admission medications   Medication Sig Start Date End Date Taking? Authorizing Provider  acetaminophen (TYLENOL) 650 MG CR tablet Take 1 tablet (650 mg total) by mouth every 8 (eight) hours as needed for pain. 01/24/19   Martyn Malay, MD  cloNIDine (CATAPRES) 0.1 MG tablet Take 0.1 mg by mouth 2 (two) times daily.    [provider]  cycloSPORINE (RESTASIS) 0.05 % ophthalmic emulsion Place 1 drop into both eyes 2 (two) times daily as needed (DRY EYES).    [provider]  lamoTRIgine (LAMICTAL) 100 MG tablet Take 100 mg by mouth at bedtime.     [provider]  levothyroxine (SYNTHROID) 75 MCG tablet TAKE (1) TABLET BY MOUTH ONCE DAILY BEFORE BREAKFAST. 02/25/19   Lind Covert, MD  lithium carbonate (ESKALITH) 450 MG CR tablet Take 1 tablet (450  mg total) by mouth every 12 (twelve) hours. 11/10/14   Pucilowska, Wardell Honour, MD  Multiple Vitamins-Minerals (SENTRY SENIOR) TABS TAKE 1 TABLET BY MOUTH ONCE DAILY. 05/07/19   Chambliss, Jeb Levering, MD  OLANZapine (ZYPREXA) 20 MG tablet Take 1.5 tablets (30 mg total) by mouth at bedtime. Take 1 and 1/2 tablets by mouth at bedtime. Patient taking differently: Take 20 mg by mouth at bedtime.  11/10/14   Pucilowska, Jolanta B, MD  Olopatadine HCl (PAZEO) 0.7 % SOLN Place 1 drop into both eyes daily as needed (every morning as needed for allergies).    [provider]  omeprazole (PRILOSEC) 20 MG capsule TAKE 1 CAPSULE BY MOUTH ONCE A DAY. 05/07/19   Lind Covert, MD  QUEtiapine (SEROQUEL) 100 MG tablet Take 100 mg by  mouth at bedtime.    [provider]  SALINE MIST 0.65 % nasal spray USE 2 SPRAYS IN EACH NOSTRIL 4 TIMES DAILY. 04/28/19   Lind Covert, MD  traZODone (DESYREL) 150 MG tablet Take 1 tablet (150 mg total) by mouth at bedtime. 11/10/14   Pucilowska, Jolanta B, MD  trolamine salicylate (ASPERCREME/ALOE) 10 % cream APPLY ONE APPLICATION TOPICALLY 4 TIMES DAILY AS NEEDED FOR MUSCLE PAIN. 01/24/19   Lind Covert, MD  vitamin E 200 UNIT capsule Take one capsule PO once daily. 09/18/19   Lind Covert, MD  vitamin E 200 UNIT capsule TAKE 1 CAPSULE BY MOUTH ONCE A DAY. 09/15/19   Lind Covert, MD    Allergies    Bee pollen and Penicillins  Review of Systems   Review of Systems  Constitutional: Negative for fever.  Respiratory: Positive for cough and shortness of breath.   Cardiovascular: Positive for chest pain.  Gastrointestinal: Positive for vomiting. Negative for abdominal pain and nausea.  Genitourinary: Negative for dysuria and hematuria.  Neurological: Negative for headaches.  All other systems reviewed and are negative.   Physical Exam Updated Vital Signs BP 114/67   Pulse 75   Temp 97.9 F (36.6 C) (Oral)   Resp 20   Ht 5\' 6"  (1.676 m)   Wt 63.5 kg   SpO2 96%   BMI 22.60 kg/m   Physical Exam Vitals and nursing note reviewed.  Constitutional:      Appearance: Normal appearance. He is well-developed.  HENT:     Head: Normocephalic and atraumatic.  Eyes:     General: Lids are normal.     Conjunctiva/sclera: Conjunctivae normal.     Pupils: Pupils are equal, round, and reactive to light.     Comments: PERRL. EOMs intact. No nystagmus. No neglect.   Cardiovascular:     Rate and Rhythm: Normal rate and regular rhythm.     Pulses: Normal pulses.     Heart sounds: Normal heart sounds. No murmur. No friction rub. No gallop.   Pulmonary:     Effort: Pulmonary effort is normal.     Breath sounds: Normal breath sounds.     Comments:  Lungs clear to auscultation bilaterally.  Symmetric chest rise.  No wheezing, rales, rhonchi. Abdominal:     Palpations: Abdomen is soft. Abdomen is not rigid.     Tenderness: There is no abdominal tenderness. There is no guarding.     Comments: Abdomen is soft, non-distended, non-tender. No rigidity, No guarding. No peritoneal signs.  Musculoskeletal:        General: Normal range of motion.     Cervical back: Full passive range of motion without  pain.  Skin:    General: Skin is warm and dry.     Capillary Refill: Capillary refill takes less than 2 seconds.  Neurological:     Mental Status: He is alert and oriented to person, place, and time.     Comments: Cranial nerves III-XII intact Follows commands, Moves all extremities  5/5 strength to BUE and BLE  Sensation intact throughout all major nerve distributions No slurred speech. No facial droop.  No gait ataxia.   Psychiatric:        Speech: Speech normal.     ED Results / Procedures / Treatments   Labs (all labs ordered are listed, but only abnormal results are displayed) Labs Reviewed  CBC WITH DIFFERENTIAL/PLATELET - Abnormal; Notable for the following components:      Result Value   RBC 3.97 (*)    Hemoglobin 12.6 (*)    HCT 37.5 (*)    Platelets 146 (*)    Neutro Abs 8.8 (*)    All other components within normal limits  COMPREHENSIVE METABOLIC PANEL - Abnormal; Notable for the following components:   Glucose, Bld 108 (*)    All other components within normal limits  RESPIRATORY PANEL BY RT PCR (FLU A&B, COVID)  TROPONIN I (HIGH SENSITIVITY)  TROPONIN I (HIGH SENSITIVITY)    EKG EKG Interpretation  Date/Time:  Sunday Sep 28 2019 20:22:59 EDT Ventricular Rate:  70 PR Interval:    QRS Duration: 101 QT Interval:  400 QTC Calculation: 432 R Axis:   47 Text Interpretation: Sinus rhythm no acute ST/T changes similar to 2019 Confirmed by Goldston, Scott ([id number]) on 09/28/2019 9:15:31 PM   Radiology DG Chest  Portable 1 View  Result Date: 09/28/2019 CLINICAL DATA:  49 year old male with chest pain. EXAM: PORTABLE CHEST 1 VIEW COMPARISON:  Chest radiograph dated 11/25/2017. FINDINGS: Shallow inspiration. No focal consolidation, pleural effusion or pneumothorax. The cardiac silhouette is within limits. No acute osseous pathology. IMPRESSION: No active disease. Electronically Signed   By: Arash  Radparvar M.D.   On: 09/28/2019 21:48    Procedures Procedures (including critical care time)  Medications Ordered in ED Medications  sodium chloride 0.9 % bolus 1,000 mL (0 mLs Intravenous Stopped 09/28/19 2257)  ondansetron (ZOFRAN) injection 4 mg (4 mg Intravenous Given 09/28/19 2119)    ED Course  I have reviewed the triage vital signs and the nursing notes.  Pertinent labs & imaging results that were available during my care of the patient were reviewed by me and considered in my medical decision making (see chart for details).    MDM Rules/Calculators/A&P                      49  year old male who presents for evaluation of chest pain that began earlier today.  He has had some episodes of vomiting.  He also reports feeling lightheaded.  Caregiver is with him who recently tested positive for Covid.  Patient has had a cough but no fevers.  Patient has not had any difficulty walking, vision changes, numbness/weakness.  He reports sharp pain that he can pinpoint in the middle of his chest.  On initially arrival, he is afebrile, nontoxic-appearing.  Vital signs are stable.  Consider musculoskeletal injury versus ACS etiology (though low suspicion) versus infectious etiology.  Additionally, he reports being lightheaded.  No neuro deficits noted on exam.  Suspect this is most likely dehydration.  Consider infectious etiology such as COVID-19 given recent exposure.  History/physical exam not concerning  for CVA.  Plan to check labs.  Covid test is negative.  CBC shows no leukocytosis.  Hemoglobin stable at 12.6.  CMP  is unremarkable.  Initial troponin is negative.  Chest x-ray negative for any infectious etiology.  At this time, patient story sounds atypical for ACS etiology.  He does have a history of mental delay so unsure of how accurate his history is.  He does not have any significant ACS risk factors but is 49 years old.  We will plan for delta Trope.  I discussed with Dr. Verner Chol who independently evaluated patient is agreeable to plan.  Delta troponin negative.  I discussed results with patient.  He reports feeling better.  I personally ambulated him in the ED without any signs of gait ataxia or abnormalities.  He denies feeling any lightheadedness, chest pain, vomiting.  He has been able to tolerate p.o. here in the department.  Given overall reassuring work-up, hemodynamic stability, feel that patient is stable for discharge at this time. At this time, patient exhibits no emergent life-threatening condition that require further evaluation in ED or admission. Patient had ample opportunity for questions and discussion. All patient's questions were answered with full understanding. Strict return precautions discussed. Patient expresses understanding and agreement to plan.   Portions of this note were generated with Lobbyist. Dictation errors may occur despite best attempts at proofreading.    Final Clinical Impression(s) / ED Diagnoses Final diagnoses:  Nonspecific chest pain    Rx / DC Orders ED Discharge Orders    None       Volanda Napoleon, PA-C 09/29/19 0018    Sherwood Gambler, MD 09/29/19 1818

## 2019-09-28 NOTE — ED Notes (Signed)
Daniel Holmes pt's caregiver will be waiting in the car. Would like to be called when pt is discharged. Phone number [phone].

## 2019-09-28 NOTE — ED Triage Notes (Signed)
Patient arrived to ED with c/o chest pain, pt with caretaker from home. Caretaker at beside. Denies shortness of breathe, pt not in distress.

## 2019-09-29 LAB — URINALYSIS, ROUTINE W REFLEX MICROSCOPIC
Bacteria, UA: NONE SEEN
Bilirubin Urine: NEGATIVE
Glucose, UA: NEGATIVE mg/dL
Ketones, ur: NEGATIVE mg/dL
Leukocytes,Ua: NEGATIVE
Nitrite: NEGATIVE
Protein, ur: NEGATIVE mg/dL
Specific Gravity, Urine: 1.004 — ABNORMAL LOW (ref 1.005–1.030)
pH: 8 (ref 5.0–8.0)

## 2019-09-29 LAB — LIPASE, BLOOD: Lipase: 20 U/L (ref 11–51)

## 2019-09-29 MED ORDER — ACETAMINOPHEN 325 MG PO TABS
650.0000 mg | ORAL_TABLET | Freq: Once | ORAL | Status: AC
  Administered 2019-09-29: 650 mg via ORAL
  Filled 2019-09-29: qty 2

## 2019-09-29 MED ORDER — ONDANSETRON HCL 4 MG PO TABS
4.0000 mg | ORAL_TABLET | Freq: Three times a day (TID) | ORAL | 0 refills | Status: DC | PRN
Start: 2019-09-29 — End: 2020-03-10

## 2019-09-29 MED ORDER — ONDANSETRON 4 MG PO TBDP
4.0000 mg | ORAL_TABLET | Freq: Once | ORAL | Status: AC
  Administered 2019-09-29: 4 mg via ORAL
  Filled 2019-09-29: qty 1

## 2019-09-29 MED ORDER — LIDOCAINE VISCOUS HCL 2 % MT SOLN
15.0000 mL | Freq: Once | OROMUCOSAL | Status: AC
  Administered 2019-09-29: 15 mL via ORAL
  Filled 2019-09-29: qty 15

## 2019-09-29 MED ORDER — ALUM & MAG HYDROXIDE-SIMETH 200-200-20 MG/5ML PO SUSP
30.0000 mL | Freq: Once | ORAL | Status: AC
  Administered 2019-09-29: 30 mL via ORAL
  Filled 2019-09-29: qty 30

## 2019-09-29 NOTE — Discharge Instructions (Signed)
Your work-up today was reassuring.  Your Covid test was negative.  Follow-up with your primary care doctor in 2 to 4 days.  Return to the Emergency Department immediately if you experiencing worsening chest pain, difficulty breathing, nausea/vomiting, get very sweaty, headache or any other worsening or concerning symptoms.

## 2019-09-29 NOTE — ED Provider Notes (Signed)
Patient discharged by previous shift.  He was seen for exertional chest pain as well as nausea and vomiting with cough.  Nursing reports he developed some vomiting again prior to discharge  Patient evaluated.  He states he is still having chest pain or lightheadedness and nausea.  His chest pain is central reproducible.  Repeat EKG is unchanged.  He had 2 - troponins earlier. He has a nonfocal neurological exam.  Abdomen soft and nontender.  Patient will be given Zofran as well as GI cocktail  Patient feels improved.  His abdomen is soft.  Repeat EKG is unchanged.  Troponin remains negative.  He denies any further dizziness, lightheadedness or nausea. He appears stable for discharge per previous team's plan.   EKG Interpretation  Date/Time:  Monday Sep 29 2019 01:59:23 EDT Ventricular Rate:  76 PR Interval:    QRS Duration: 95 QT Interval:  367 QTC Calculation: 413 R Axis:   50 Text Interpretation: Sinus rhythm Abnormal R-wave progression, early transition Borderline T wave abnormalities No significant change was found Confirmed by Ezequiel Essex [phone]) on 09/29/2019 2:11:59 AM         Ezequiel Essex, MD 09/29/19 [phone]

## 2019-09-29 NOTE — ED Notes (Signed)
Patient was able to tolerate fluids without n/v.

## 2019-10-09 ENCOUNTER — Encounter: Payer: Medicaid Other | Admitting: Gastroenterology

## 2019-10-24 ENCOUNTER — Ambulatory Visit (AMBULATORY_SURGERY_CENTER): Payer: Medicaid Other | Admitting: *Deleted

## 2019-10-24 ENCOUNTER — Other Ambulatory Visit: Payer: Self-pay

## 2019-10-24 VITALS — Ht 66.0 in | Wt 150.0 lb

## 2019-10-24 DIAGNOSIS — Z01818 Encounter for other preprocedural examination: Secondary | ICD-10-CM

## 2019-10-24 DIAGNOSIS — Z1211 Encounter for screening for malignant neoplasm of colon: Secondary | ICD-10-CM

## 2019-10-24 MED ORDER — NA SULFATE-K SULFATE-MG SULF 17.5-3.13-1.6 GM/177ML PO SOLN: 1.0000 | Freq: Once | ORAL | 0 refills | Status: AC

## 2019-10-24 NOTE — Progress Notes (Signed)

## 2019-11-04 ENCOUNTER — Other Ambulatory Visit: Payer: Self-pay | Admitting: Gastroenterology

## 2019-11-04 ENCOUNTER — Ambulatory Visit (INDEPENDENT_AMBULATORY_CARE_PROVIDER_SITE_OTHER): Payer: Medicaid Other

## 2019-11-04 DIAGNOSIS — Z1159 Encounter for screening for other viral diseases: Secondary | ICD-10-CM

## 2019-11-04 LAB — SARS CORONAVIRUS 2 (TAT 6-24 HRS): SARS Coronavirus 2: NEGATIVE

## 2019-11-06 ENCOUNTER — Encounter: Payer: Self-pay | Admitting: Gastroenterology

## 2019-11-06 ENCOUNTER — Ambulatory Visit (AMBULATORY_SURGERY_CENTER): Payer: Medicaid Other | Admitting: Gastroenterology

## 2019-11-06 ENCOUNTER — Other Ambulatory Visit: Payer: Self-pay

## 2019-11-06 VITALS — BP 117/66 | HR 94 | Temp 97.5°F | Resp 18 | Ht 66.0 in

## 2019-11-06 DIAGNOSIS — Z1211 Encounter for screening for malignant neoplasm of colon: Secondary | ICD-10-CM | POA: Diagnosis not present

## 2019-11-06 DIAGNOSIS — D124 Benign neoplasm of descending colon: Secondary | ICD-10-CM | POA: Diagnosis not present

## 2019-11-06 DIAGNOSIS — K635 Polyp of colon: Secondary | ICD-10-CM | POA: Diagnosis not present

## 2019-11-06 MED ORDER — SODIUM CHLORIDE 0.9 % IV SOLN: 500.0000 mL | Freq: Once | INTRAVENOUS | Status: DC

## 2019-11-06 NOTE — Progress Notes (Signed)
Called to room to assist during endoscopic procedure.  Patient ID and intended procedure confirmed with present staff. Received instructions for my participation in the procedure from the performing physician.  

## 2019-11-06 NOTE — Progress Notes (Signed)
Pt's states no medical or surgical changes since previsit or office visit. 

## 2019-11-06 NOTE — Patient Instructions (Signed)
Discharge instructions given. Handouts on polyps and hemorrhoids. Resume previous medications. YOU HAD AN ENDOSCOPIC PROCEDURE TODAY AT THE [facility]:   Refer to the procedure report that was given to you for any specific questions about what was found during the examination.  If the procedure report does not answer your questions, please call your gastroenterologist to clarify.  If you requested that your care partner not be given the details of your procedure findings, then the procedure report has been included in a sealed envelope for you to review at your convenience later.  YOU SHOULD EXPECT: Some feelings of bloating in the abdomen. Passage of more gas than usual.  Walking can help get rid of the air that was put into your GI tract during the procedure and reduce the bloating. If you had a lower endoscopy (such as a colonoscopy or flexible sigmoidoscopy) you may notice spotting of blood in your stool or on the toilet paper. If you underwent a bowel prep for your procedure, you may not have a normal bowel movement for a few days.  Please Note:  You might notice some irritation and congestion in your nose or some drainage.  This is from the oxygen used during your procedure.  There is no need for concern and it should clear up in a day or so.  SYMPTOMS TO REPORT IMMEDIATELY:  Following lower endoscopy (colonoscopy or flexible sigmoidoscopy):  Excessive amounts of blood in the stool  Significant tenderness or worsening of abdominal pains  Swelling of the abdomen that is new, acute  Fever of 100F or higher   For urgent or emergent issues, a gastroenterologist can be reached at any hour by calling [phone]. Do not use MyChart messaging for urgent concerns.    DIET:  We do recommend a small meal at first, but then you may proceed to your regular diet.  Drink plenty of fluids but you should avoid alcoholic beverages for 24 hours.  ACTIVITY:  You should plan to take it  easy for the rest of today and you should NOT DRIVE or use heavy machinery until tomorrow (because of the sedation medicines used during the test).    FOLLOW UP: Our staff will call the number listed on your records 48-72 hours following your procedure to check on you and address any questions or concerns that you may have regarding the information given to you following your procedure. If we do not reach you, we will leave a message.  We will attempt to reach you two times.  During this call, we will ask if you have developed any symptoms of COVID 19. If you develop any symptoms (ie: fever, flu-like symptoms, shortness of breath, cough etc.) before then, please call [phone].  If you test positive for Covid 19 in the 2 weeks post procedure, please call and report this information to us.    If any biopsies were taken you will be contacted by phone or by letter within the next 1-3 weeks.  Please call us at [phone] if you have not heard about the biopsies in 3 weeks.    SIGNATURES/CONFIDENTIALITY: You and/or your care partner have signed paperwork which will be entered into your electronic medical record.  These signatures attest to the fact that that the information above on your After Visit Summary has been reviewed and is understood.  Full responsibility of the confidentiality of this discharge information lies with you and/or your care-partner.  

## 2019-11-06 NOTE — Op Note (Signed)
[facility] Patient Name: Daniel Holmes Procedure Date: 11/06/2019 11:08 AM MRN: [id number] Endoscopist: Thornton Park MD, MD Age: 49 Referring MD:  Date of Birth: 09-07-1970 Gender: Male Account #: [id number] Procedure:                Colonoscopy Indications:              Screening for colorectal malignant neoplasm, This                            is the patient's first colonoscopy                           No known family history of colon cancer or polyps Medicines:                Monitored Anesthesia Care Procedure:                Pre-Anesthesia Assessment:                           - Prior to the procedure, a History and Physical                            was performed, and patient medications and                            allergies were reviewed. The patient's tolerance of                            previous anesthesia was also reviewed. The risks                            and benefits of the procedure and the sedation                            options and risks were discussed with the patient.                            All questions were answered, and informed consent                            was obtained. Prior Anticoagulants: The patient has                            taken no previous anticoagulant or antiplatelet                            agents. ASA Grade Assessment: II - A patient with                            mild systemic disease. After reviewing the risks                            and benefits, the patient was deemed in  satisfactory condition to undergo the procedure.                           After obtaining informed consent, the colonoscope                            was passed under direct vision. Throughout the                            procedure, the patient's blood pressure, pulse, and                            oxygen saturations were monitored continuously. The                            Colonoscope was  introduced through the anus and                            advanced to the 3 cm into the ileum. A second                            forward view of the right colon was performed. The                            colonoscopy was performed with moderate difficulty                            due to a redundant colon, significant looping and a                            tortuous colon. Successful completion of the                            procedure was aided by applying abdominal pressure.                            The patient tolerated the procedure well. The                            quality of the bowel preparation was good. The                            terminal ileum, ileocecal valve, appendiceal                            orifice, and rectum were photographed. Scope In: 11:11:54 AM Scope Out: 11:26:45 AM Scope Withdrawal Time: 0 hours 9 minutes 58 seconds  Total Procedure Duration: 0 hours 14 minutes 51 seconds  Findings:                 The perianal and digital rectal examinations were                            normal.  Non-bleeding internal hemorrhoids were found. The                            hemorrhoids were small.                           A 2 mm polyp was found in the distal descending                            colon. The polyp was flat. The polyp was removed                            with a cold snare. Resection and retrieval were                            complete. Estimated blood loss was minimal.                           The exam was otherwise without abnormality on                            direct and retroflexion views. Complications:            No immediate complications. Estimated blood loss:                            Minimal. Estimated Blood Loss:     Estimated blood loss was minimal. Impression:               - Non-bleeding internal hemorrhoids.                           - One 2 mm polyp in the distal descending colon,                             removed with a cold snare. Resected and retrieved.                           - The examination was otherwise normal on direct                            and retroflexion views. Recommendation:           - Patient has a contact number available for                            emergencies. The signs and symptoms of potential                            delayed complications were discussed with the                            patient. Return to normal activities tomorrow.                            Written  discharge instructions were provided to the                            patient.                           - Resume previous diet.                           - Continue present medications.                           - Await pathology results.                           - Repeat colonoscopy date to be determined after                            pending pathology results are reviewed for                            surveillance.                           - Emerging evidence supports eating a diet of                            fruits, vegetables, grains, calcium, and yogurt                            while reducing red meat and alcohol may reduce the                            risk of colon cancer.                           - Thank you for allowing me to be involved in your                            colon cancer prevention. Thornton Park MD, MD 11/06/2019 11:32:29 AM This report has been signed electronically.

## 2019-11-06 NOTE — Progress Notes (Signed)
A/ox3, pleased with MAC, report to RN 

## 2019-11-10 ENCOUNTER — Telehealth: Payer: Self-pay

## 2019-11-10 NOTE — Telephone Encounter (Signed)
  Follow up Call-  Call back number 11/06/2019  Post procedure Call Back phone  # [phone]  Permission to leave phone message Yes  Some recent data might be hidden     Patient questions:  Do you have a fever, pain , or abdominal swelling? No. Pain Score  0 *  Have you tolerated food without any problems? Yes.    Have you been able to return to your normal activities? Yes.    Do you have any questions about your discharge instructions: Diet   No. Medications  No. Follow up visit  No.  Do you have questions or concerns about your Care? No.  Actions: * If pain score is 4 or above: No action needed, pain <4.  1. Have you developed a fever since your procedure? no  2.   Have you had an respiratory symptoms (SOB or cough) since your procedure? no  3.   Have you tested positive for COVID 19 since your procedure no  4.   Have you had any family members/close contacts diagnosed with the COVID 19 since your procedure?  no   If yes to any of these questions please route to Joylene John, RN and Erenest Rasher, RN

## 2019-11-11 ENCOUNTER — Encounter: Payer: Self-pay | Admitting: Gastroenterology

## 2019-11-16 IMAGING — DX DG HAND COMPLETE 3+V*L*
3 series · 3 of 3 positions shown · non-contrast
Comparison: None.

CLINICAL DATA: Left middle finger started swelling today. No
trauma.

EXAM:
LEFT HAND - COMPLETE 3+ VIEW

[x hand pa left]
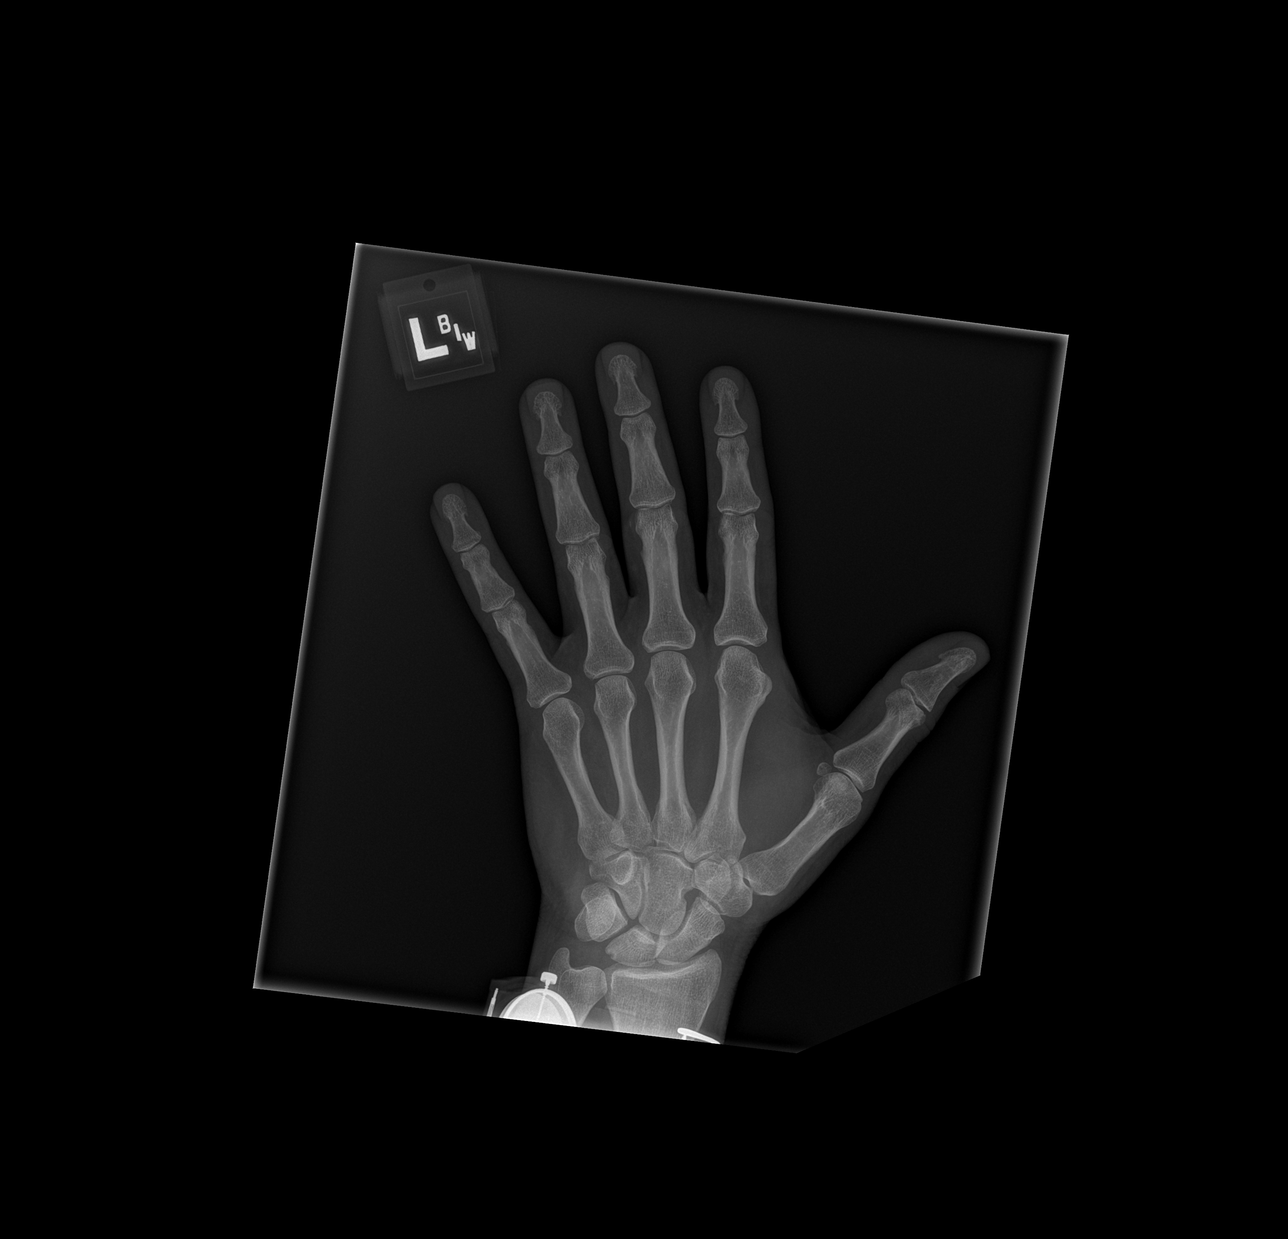

[x hand obl left]
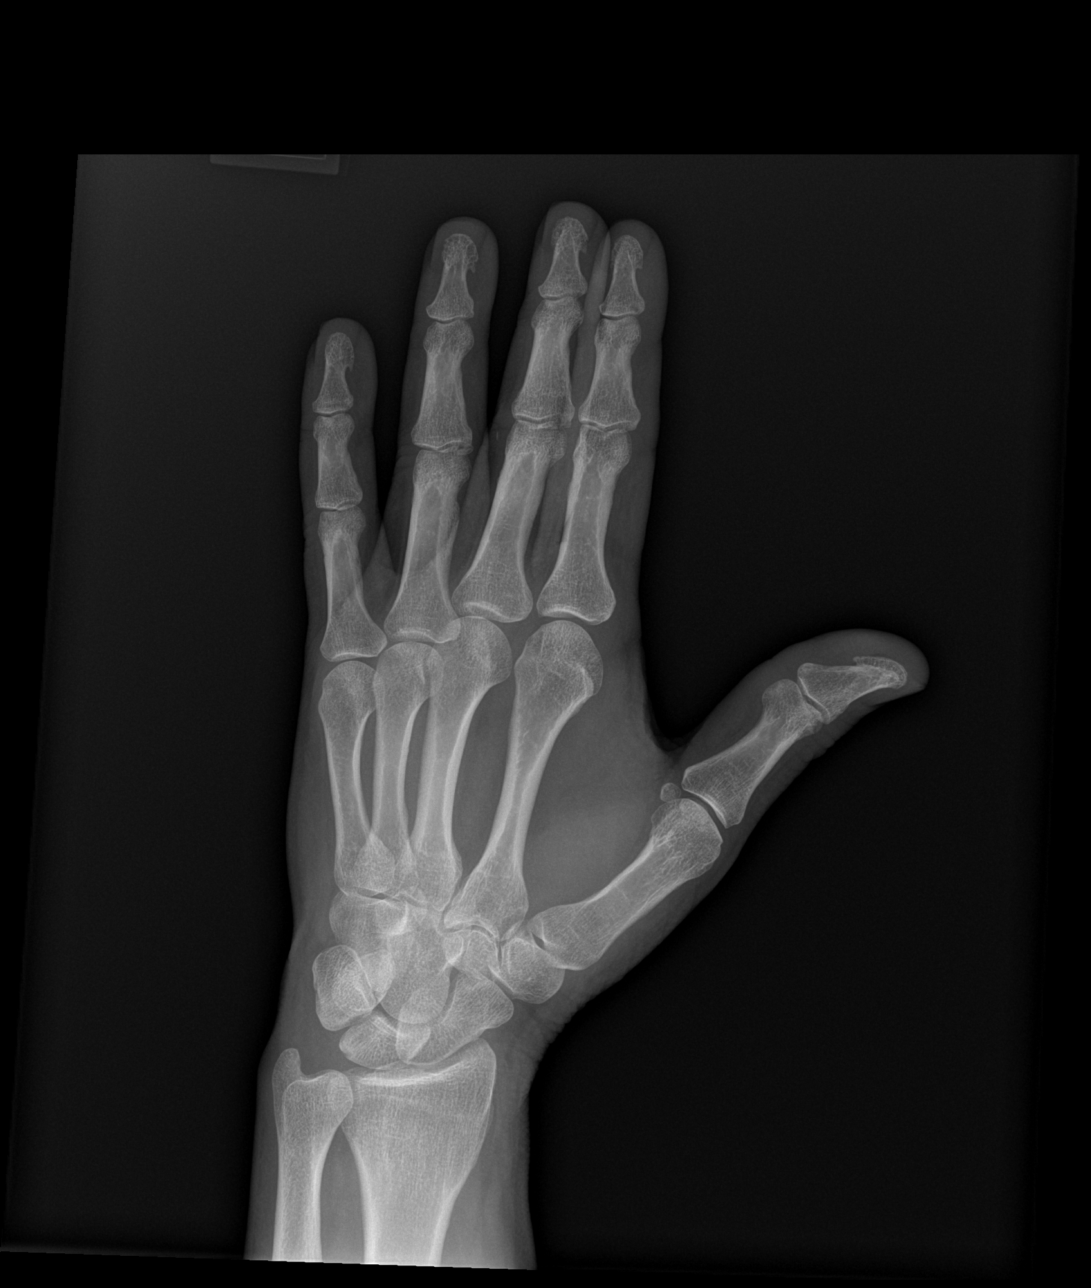

[x hand lat left]
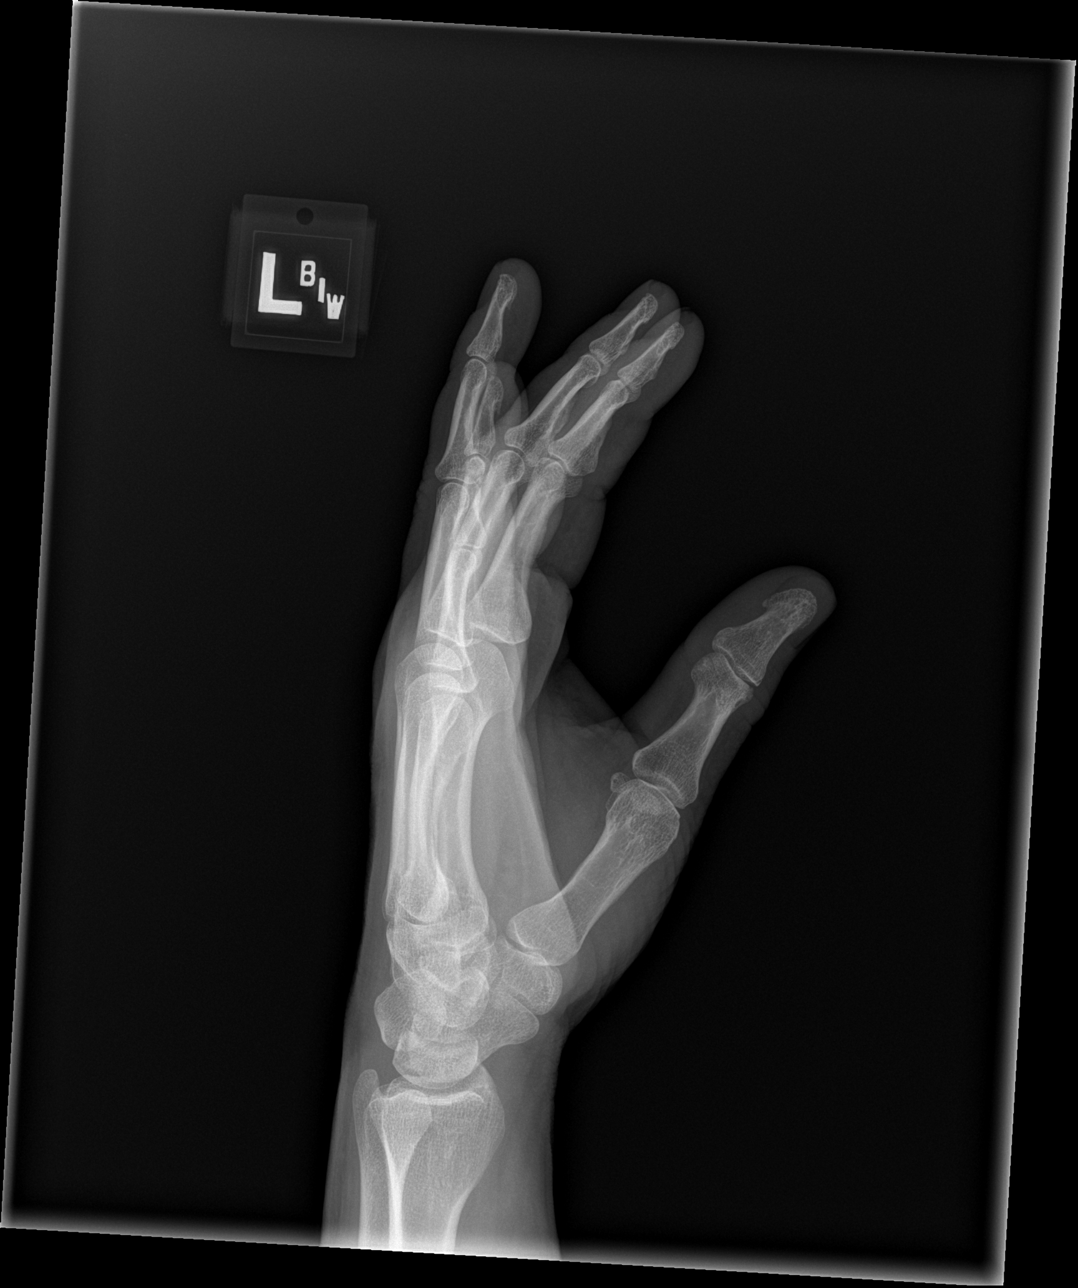

[3 of 3 positions shown; findings below may reference images not displayed]

FINDINGS: No acute fractures or dislocations. There is linear high attenuation
along the ulnar side of the distal aspect of the proximal third
phalanx. There is a well corticated notch in the lunate of doubtful
acute significance. No other abnormalities.
IMPRESSION: 1. There is a small linear density in the soft tissues of the third
finger measuring 2 mm, just proximal to the proximal phalanx along
the ulnar aspect of the finger. This could represent soft tissue
calcification, sequela of previous injury given the lack of current
trauma, or a foreign body. Recommend clinical correlation.

## 2019-12-04 ENCOUNTER — Other Ambulatory Visit: Payer: Self-pay | Admitting: Family Medicine

## 2020-01-11 ENCOUNTER — Encounter (HOSPITAL_COMMUNITY): Payer: Self-pay | Admitting: Emergency Medicine

## 2020-01-11 ENCOUNTER — Other Ambulatory Visit: Payer: Self-pay

## 2020-01-11 ENCOUNTER — Emergency Department (HOSPITAL_COMMUNITY): Payer: Medicaid Other

## 2020-01-11 DIAGNOSIS — R0789 Other chest pain: Secondary | ICD-10-CM | POA: Diagnosis not present

## 2020-01-11 DIAGNOSIS — R0602 Shortness of breath: Secondary | ICD-10-CM | POA: Insufficient documentation

## 2020-01-11 DIAGNOSIS — J45909 Unspecified asthma, uncomplicated: Secondary | ICD-10-CM | POA: Insufficient documentation

## 2020-01-11 DIAGNOSIS — Z87891 Personal history of nicotine dependence: Secondary | ICD-10-CM | POA: Diagnosis not present

## 2020-01-11 LAB — TROPONIN I (HIGH SENSITIVITY): Troponin I (High Sensitivity): 2 ng/L (ref <18)

## 2020-01-11 LAB — BASIC METABOLIC PANEL
Anion gap: 13 (ref 5–15)
BUN: 9 mg/dL (ref 6–20)
CO2: 25 mmol/L (ref 22–32)
Calcium: 10.6 mg/dL — ABNORMAL HIGH (ref 8.9–10.3)
Chloride: 106 mmol/L (ref 98–111)
Creatinine, Ser: 1.1 mg/dL (ref 0.61–1.24)
GFR calc Af Amer: >60 mL/min (ref >60)
GFR calc non Af Amer: >60 mL/min (ref >60)
Glucose, Bld: 116 mg/dL — ABNORMAL HIGH (ref 70–99)
Potassium: 3.8 mmol/L (ref 3.5–5.1)
Sodium: 144 mmol/L (ref 135–145)

## 2020-01-11 LAB — CBC
HCT: 38 % — ABNORMAL LOW (ref 39.0–52.0)
Hemoglobin: 12.8 g/dL — ABNORMAL LOW (ref 13.0–17.0)
MCH: 32 pg (ref 26.0–34.0)
MCHC: 33.7 g/dL (ref 30.0–36.0)
MCV: 95 fL (ref 80.0–100.0)
Platelets: 190 10*3/uL (ref 150–400)
RBC: 4 MIL/uL — ABNORMAL LOW (ref 4.22–5.81)
RDW: 12.5 % (ref 11.5–15.5)
WBC: 8.3 10*3/uL (ref 4.0–10.5)
nRBC: 0 % (ref 0.0–0.2)
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
## 2020-01-11 NOTE — ED Notes (Signed)
Save blue tube in main lab °

## 2020-01-12 ENCOUNTER — Emergency Department (HOSPITAL_COMMUNITY)
Admission: EM | Admit: 2020-01-12 | Discharge: 2020-01-12 | Disposition: A | Discharge status: Home / Self Care | Payer: Medicaid Other | Attending: Emergency Medicine | Admitting: Emergency Medicine

## 2020-01-12 ENCOUNTER — Ambulatory Visit (HOSPITAL_COMMUNITY)
Admission: AD | Admit: 2020-01-12 | Discharge: 2020-01-12 | Disposition: A | Discharge status: Home / Self Care | Payer: Medicaid Other | Attending: Psychiatry | Admitting: Psychiatry

## 2020-01-12 ENCOUNTER — Ambulatory Visit (HOSPITAL_COMMUNITY): Admission: AD | Admit: 2020-01-12 | Payer: Medicaid Other | Source: Home / Self Care | Admitting: Psychiatry

## 2020-01-12 ENCOUNTER — Other Ambulatory Visit: Payer: Self-pay

## 2020-01-12 DIAGNOSIS — K0889 Other specified disorders of teeth and supporting structures: Secondary | ICD-10-CM | POA: Insufficient documentation

## 2020-01-12 DIAGNOSIS — Z1330 Encounter for screening examination for mental health and behavioral disorders, unspecified: Secondary | ICD-10-CM | POA: Insufficient documentation

## 2020-01-12 DIAGNOSIS — R0602 Shortness of breath: Secondary | ICD-10-CM

## 2020-01-12 DIAGNOSIS — R0789 Other chest pain: Secondary | ICD-10-CM

## 2020-01-12 DIAGNOSIS — Z590 Homelessness: Secondary | ICD-10-CM | POA: Diagnosis not present

## 2020-01-12 DIAGNOSIS — Z915 Personal history of self-harm: Secondary | ICD-10-CM | POA: Diagnosis not present

## 2020-01-12 MED ORDER — ACETAMINOPHEN 500 MG PO TABS
1000.0000 mg | ORAL_TABLET | Freq: Once | ORAL | Status: AC
  Administered 2020-01-12: 1000 mg via ORAL
  Filled 2020-01-12: qty 2

## 2020-01-12 MED ORDER — NAPROXEN 500 MG PO TABS: 500.0000 mg | ORAL_TABLET | Freq: Once | ORAL | Status: DC

## 2020-01-12 MED ORDER — ALBUTEROL SULFATE HFA 108 (90 BASE) MCG/ACT IN AERS
2.0000 | INHALATION_SPRAY | RESPIRATORY_TRACT | Status: DC | PRN
  Administered 2020-01-12: 2 via RESPIRATORY_TRACT

## 2020-01-12 NOTE — Social Work (Signed)
Consult request has been received. CSW attempting to follow up at present time.   CSW will continue to follow for needs.  Pt was recently dc'd.  Loretto Belinsky Tarpley-Carter, MSW, LCSW-A                  [facility] ED Transitions of Care Clinical Social Worker [email or web address] [phone]

## 2020-01-12 NOTE — BH Assessment (Signed)
Assessment Note  Daniel Holmes is a single 49 y.o. male who presents to [facility] for a walk-in assessment. Pt denies SI, HI, depression sx and AVH. He was agreeable to returning to current group home. Pt notified his mother by telephone in this writer's presence. She told him she would be able to sleep better.   Diagnosis: Bipolar 1 Disorder Disposition: Ricky Ala, NP recommends psychiatric clearance  Past Medical History:  Past Medical History:  Diagnosis Date  . Allergy   . Asthma   . Bipolar 1 disorder ([place])   . Mental developmental delay   . Psychosis [facility])    See Dr [name]    Past Surgical History:  Procedure Laterality Date  . HERNIA REPAIR      Family History:  Family History  Problem Relation Age of Onset  . Colon cancer Neg Hx   . Esophageal cancer Neg Hx   . Stomach cancer Neg Hx   . Rectal cancer Neg Hx     Social History:  reports that he quit smoking about 21 years ago. His smoking use included cigarettes. He has a 10.00 pack-year smoking history. He has never used smokeless tobacco. He reports that he does not drink alcohol and does not use drugs.  Additional Social History:  Alcohol / Drug Use Pain Medications: see MAR Prescriptions: see MAR Over the Counter: see MAR History of alcohol / drug use?: No history of alcohol / drug abuse  CIWA:   COWS:    Allergies:  Allergies  Allergen Reactions  . Bee Pollen Anaphylaxis  . Penicillins Anaphylaxis    Childhood ALLERGY Has patient had a PCN reaction causing immediate rash, facial/tongue/throat swelling, SOB or lightheadedness with hypotension: Yes Has patient had a PCN reaction causing severe rash involving mucus membranes or skin necrosis: No Has patient had a PCN reaction that required hospitalization No Has patient had a PCN reaction occurring within the last 10 years: No If all of the above answers are "NO", then may proceed with Cephalosporin use.     Home  Medications: (Not in a hospital admission)   OB/GYN Status:  No LMP for male patient.  General Assessment Data Location of Assessment:  [facility]) TTS Assessment: In system Is this a Tele or Face-to-Face Assessment?: Face-to-Face Is this an Initial Assessment or a Re-assessment for this encounter?: Initial Assessment Patient Accompanied by:: N/A Language Other than English: No Living Arrangements: In Group Home: (Comment: Name of [facility]) What gender do you identify as?: Male Date Telepsych consult ordered in [facility]: 01/12/20 Marital status: Single Living Arrangements: Non-relatives/Friends, Group Home Can pt return to current living arrangement?: Yes Admission Status: Voluntary Is patient capable of signing voluntary admission?: No Referral Source: Self/Family/Friend Insurance type: medicaid  Medical Screening Exam ([place]) Medical Exam completed: Yes  Crisis Care Plan Living Arrangements: Non-relatives/Friends, Group Home Legal Guardian: Other: (DSS) Name of Psychiatrist: denies Name of Therapist: denies  Education Status Is patient currently in school?: No Is the patient employed, unemployed or receiving disability?: Employed ([employer] x 3 days. Pt states he loves his job)  Risk to self with the past 6 months Suicidal Ideation: No Has patient been a risk to self within the past 6 months prior to admission? : No Suicidal Intent: No Has patient had any suicidal intent within the past 6 months prior to admission? : No Is patient at risk for suicide?: No Suicidal Plan?: No Has patient had any suicidal plan within the  past 6 months prior to admission? : No Access to Means: No What has been your use of drugs/alcohol within the last 12 months?: none Previous Attempts/Gestures: Yes How many times?: 8 Intentional Self Injurious Behavior: None Recent stressful life event(s):  (not getting along with man who runs family care home) Persecutory voices/beliefs?:  No Depression: No Depression Symptoms:  (denies all) Substance abuse history and/or treatment for substance abuse?: No Suicide prevention information given to non-admitted patients: Not applicable  Risk to Others within the past 6 months Homicidal Ideation: No Does patient have any lifetime risk of violence toward others beyond the six months prior to admission? : No Thoughts of Harm to Others: No Current Homicidal Intent: No Current Homicidal Plan: No Access to Homicidal Means: No History of harm to others?: No Assessment of Violence: None Noted Does patient have access to weapons?: No Criminal Charges Pending?: No Does patient have a court date: No Is patient on probation?: No  Psychosis Hallucinations: None noted Delusions: None noted  Mental Status Report Appearance/Hygiene: Disheveled Eye Contact: Good Motor Activity: Freedom of movement Speech: Loud, Slurred Level of Consciousness: Alert Mood: Pleasant, Sad Affect: Appropriate to circumstance, Labile Anxiety Level: Minimal Thought Processes: Coherent, Relevant Judgement: Partial Orientation: Appropriate for developmental age Obsessive Compulsive Thoughts/Behaviors: None  Cognitive Functioning Concentration: Fair Memory: Unable to Assess Is patient IDD: Yes Insight: Fair Impulse Control: Fair Appetite: Good Have you had any weight changes? : Gain Sleep: Decreased Total Hours of Sleep: 4  ADLScreening [facility] Assessment Services) Patient's cognitive ability adequate to safely complete daily activities?: Yes Patient able to express need for assistance with ADLs?: Yes Independently performs ADLs?: Yes (appropriate for developmental age)  Prior Inpatient Therapy Prior Inpatient Therapy: No  Prior Outpatient Therapy Prior Outpatient Therapy: No Does patient have an ACCT team?: No Does patient have Intensive In-House Services?  : No Does patient have [facility] services? : No Does patient have P4CC services?:  No  ADL Screening (condition at time of admission) Patient's cognitive ability adequate to safely complete daily activities?: Yes Is the patient deaf or have difficulty hearing?: No Does the patient have difficulty seeing, even when wearing glasses/contacts?: No Patient able to express need for assistance with ADLs?: Yes Does the patient have difficulty dressing or bathing?: No Independently performs ADLs?: Yes (appropriate for developmental age) Does the patient have difficulty walking or climbing stairs?: No Weakness of Legs: None Weakness of Arms/Hands: None  Home Assistive Devices/Equipment Home Assistive Devices/Equipment: None  Therapy Consults (therapy consults require a physician order) PT Evaluation Needed: No OT Evalulation Needed: No SLP Evaluation Needed: No Abuse/Neglect Assessment (Assessment to be complete while patient is alone) Abuse/Neglect Assessment Can Be Completed: Yes Physical Abuse: Denies Verbal Abuse: Denies Sexual Abuse: Denies Exploitation of patient/patient's resources: Denies Self-Neglect: Denies Values / Beliefs Cultural Requests During Hospitalization: None Spiritual Requests During Hospitalization: None Consults Spiritual Care Consult Needed: No Transition of Care Team Consult Needed: No            Disposition: Ricky Ala, NP recommends psychiatric clearance Disposition Initial Assessment Completed for this Encounter: Yes Disposition of Patient: Discharge  On Site Evaluation by:   Reviewed with Physician:    Richardean Chimera 01/12/2020 8:38 PM

## 2020-01-12 NOTE — H&P (Signed)
Behavioral Health Medical Screening Exam  Daniel Holmes is a 49 y.o. male presents to [facility] behavioral health requesting evaluation citing" my mother told me to".  Patient is difficult to understand due to poor detention.  He is denying suicidal or homicidal ideations.  Denies auditory or visual hallucinations.  Daniel Holmes denied that he is followed by psychiatry and/or therapy currently.  Does report taking medications daily however is been able to recall the names of medications that are prescribed to him.  Per TTS counselor patient is homeless and had slept at the local hospital on last night.  Daniel Holmes reported he was recently evaluated due to chest pain and was provided with the inhaler for his shortness of breath.   Daniel Holmes reports previous inpatient admissions for suicide attempts 1 year prior.  Currently denying experiencing thoughts or has a plan at this time.  Daniel Holmes appears future and goal oriented as he states" I do not want to stay here have to go to work in the morning."  Patient observed wearing a [employer] vest.  Patient adamantly is denying suicidal or homicidal thoughts plan or intent.  Patient reports he has an apartment however is unable to return back at this time."  It is a mess"  TTS staff to follow-up with patient's care coordinator.   Will attempt to obtain additional collateral from mother.  Total Time spent with patient: 15 minutes  Psychiatric Specialty Exam  Presentation  General Appearance:No data recorded Eye Contact:No data recorded Speech:No data recorded Speech Volume:No data recorded Handedness:No data recorded  Mood and Affect  Mood:No data recorded Affect:No data recorded  Thought Process  Thought Processes:No data recorded Descriptions of Associations:No data recorded Orientation:No data recorded Thought Content:No data recorded Hallucinations:No data recorded Ideas of Reference:No data recorded Suicidal Thoughts:No data recorded Homicidal Thoughts:No  data recorded  Sensorium  Memory:No data recorded Judgment:No data recorded Insight:No data recorded  Executive Functions  Concentration:No data recorded Attention Span:No data recorded Recall:No data recorded Fund of Knowledge:No data recorded Language:No data recorded  Psychomotor Activity  Psychomotor Activity:No data recorded  Assets  Assets:No data recorded  Sleep  Sleep:No data recorded Number of hours: No data recorded  Physical Exam: Physical Exam ROS There were no vitals taken for this visit. There is no height or weight on file to calculate BMI.  Musculoskeletal: Strength & Muscle Tone: within normal limits Gait & Station: Patient observed sitting upright in TTS assessment room Patient leans: N/A   Recommendations: TTS to provide additional outpatient resource Follow-up with mother for additional collateral information -Patient does not have a legal guardian Based on my evaluation the patient does not appear to have an emergency medical condition.  Derrill [place], NP 01/12/2020, 5:00 PM
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
## 2020-01-12 NOTE — Social Work (Signed)
TOC CSW spoke with pt.  Pt gave consent for CSW to speak with cargiver/Nick Davis [phone] and [name] Kennedy/[place]/legal guardian [phone].  Merrilee Seashore  stated that [name] was aware of pts situation and is currently trying to find pt somewhere else to go.    CSW reached out to [place] and left a HIPPA compliant message with contact information for her.  CSW updated pt on findings and pt thanked CSW.  CSW asked pt if there was anything else she could assist him with and pt stated, "no, thank you".    CSW received a call from Ronne Binning stating he has been speaking with client and he needed an update on his location.  Merrilee Seashore was informed that pt was currently still in ED waiting area on the phone with his mom.  Please reconsult if new social work issues arise, CSW signing off.  Bladimir Auman Tarpley-Carter, MSW, LCSW-A                  [facility] ED Transitions of CareClinical Social Worker [email or web address] [phone]

## 2020-01-26 ENCOUNTER — Emergency Department (HOSPITAL_COMMUNITY)
Admission: EM | Admit: 2020-01-26 | Discharge: 2020-01-27 | Disposition: A | Discharge status: Home / Self Care | Payer: Medicaid Other | Attending: Emergency Medicine | Admitting: Emergency Medicine

## 2020-01-26 ENCOUNTER — Other Ambulatory Visit: Payer: Self-pay

## 2020-01-26 ENCOUNTER — Encounter (HOSPITAL_COMMUNITY): Payer: Self-pay

## 2020-01-26 DIAGNOSIS — F69 Unspecified disorder of adult personality and behavior: Secondary | ICD-10-CM | POA: Insufficient documentation

## 2020-01-26 DIAGNOSIS — Z79899 Other long term (current) drug therapy: Secondary | ICD-10-CM | POA: Insufficient documentation

## 2020-01-26 DIAGNOSIS — Z87891 Personal history of nicotine dependence: Secondary | ICD-10-CM | POA: Diagnosis not present

## 2020-01-26 DIAGNOSIS — J45909 Unspecified asthma, uncomplicated: Secondary | ICD-10-CM | POA: Diagnosis not present

## 2020-01-26 DIAGNOSIS — E039 Hypothyroidism, unspecified: Secondary | ICD-10-CM | POA: Insufficient documentation

## 2020-01-26 DIAGNOSIS — R451 Restlessness and agitation: Secondary | ICD-10-CM | POA: Diagnosis not present

## 2020-01-26 DIAGNOSIS — Z046 Encounter for general psychiatric examination, requested by authority: Secondary | ICD-10-CM | POA: Diagnosis not present

## 2020-01-26 DIAGNOSIS — Z008 Encounter for other general examination: Secondary | ICD-10-CM

## 2020-01-26 LAB — CBC WITH DIFFERENTIAL/PLATELET
Abs Immature Granulocytes: 0.03 10*3/uL (ref 0.00–0.07)
Basophils Absolute: 0 10*3/uL (ref 0.0–0.1)
Basophils Relative: 0 %
Eosinophils Absolute: 0 10*3/uL (ref 0.0–0.5)
Eosinophils Relative: 0 %
HCT: 36.6 % — ABNORMAL LOW (ref 39.0–52.0)
Hemoglobin: 12.7 g/dL — ABNORMAL LOW (ref 13.0–17.0)
Immature Granulocytes: 0 %
Lymphocytes Relative: 14 %
Lymphs Abs: 1.1 10*3/uL (ref 0.7–4.0)
MCH: 32.2 pg (ref 26.0–34.0)
MCHC: 34.7 g/dL (ref 30.0–36.0)
MCV: 92.7 fL (ref 80.0–100.0)
Monocytes Absolute: 0.3 10*3/uL (ref 0.1–1.0)
Monocytes Relative: 4 %
Neutro Abs: 6.1 10*3/uL (ref 1.7–7.7)
Neutrophils Relative %: 82 %
Platelets: 189 10*3/uL (ref 150–400)
RBC: 3.95 MIL/uL — ABNORMAL LOW (ref 4.22–5.81)
RDW: 11.9 % (ref 11.5–15.5)
WBC: 7.6 10*3/uL (ref 4.0–10.5)
nRBC: 0 % (ref 0.0–0.2)

## 2020-01-26 LAB — COMPREHENSIVE METABOLIC PANEL
ALT: 26 U/L (ref 0–44)
AST: 29 U/L (ref 15–41)
Albumin: 4.3 g/dL (ref 3.5–5.0)
Alkaline Phosphatase: 72 U/L (ref 38–126)
Anion gap: 11 (ref 5–15)
BUN: <5 mg/dL — ABNORMAL LOW (ref 6–20)
CO2: 20 mmol/L — ABNORMAL LOW (ref 22–32)
Calcium: 9.6 mg/dL (ref 8.9–10.3)
Chloride: 106 mmol/L (ref 98–111)
Creatinine, Ser: 0.97 mg/dL (ref 0.61–1.24)
GFR calc Af Amer: >60 mL/min (ref >60)
GFR calc non Af Amer: >60 mL/min (ref >60)
Glucose, Bld: 147 mg/dL — ABNORMAL HIGH (ref 70–99)
Potassium: 3.7 mmol/L (ref 3.5–5.1)
Sodium: 137 mmol/L (ref 135–145)
Total Bilirubin: 0.6 mg/dL (ref 0.3–1.2)
Total Protein: 7 g/dL (ref 6.5–8.1)

## 2020-01-26 LAB — RAPID URINE DRUG SCREEN, HOSP PERFORMED
Amphetamines: NOT DETECTED
Barbiturates: NOT DETECTED
Benzodiazepines: NOT DETECTED
Cocaine: NOT DETECTED
Opiates: NOT DETECTED
Tetrahydrocannabinol: NOT DETECTED

## 2020-01-26 LAB — LITHIUM LEVEL: Lithium Lvl: 0.6 mmol/L (ref 0.60–1.20)

## 2020-01-26 LAB — ETHANOL: Alcohol, Ethyl (B): <10 mg/dL (ref <10)

## 2020-01-26 NOTE — ED Notes (Signed)
TTS interview is in progress.

## 2020-01-26 NOTE — ED Triage Notes (Signed)
Patient denies any SI/HI. GPd reports that the patient was at an outpatient facility and began having erratic behavior. [employer] was called and patient came voluntarily.
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
## 2020-01-26 NOTE — ED Provider Notes (Signed)
Discussed with Adora Fridge, NP at behavioral health. Recommends overnight observation and reassess in the AM. Pt is not under IVC, per previous provider requesting d/c. If pt is adamant he wants to be d/c, Adora Fridge agrees to d/c, as pt does not meet IVC criteria currently.    Franchot Heidelberg, PA-C 01/26/20 2219    Hayden Rasmussen, MD 01/27/20 1046

## 2020-01-27 DIAGNOSIS — R451 Restlessness and agitation: Secondary | ICD-10-CM

## 2020-01-27 NOTE — Progress Notes (Signed)
TOC CSW reached out to both Administrator, sports at [facility] and [facility], neither answered phone call.  CSW left HIPPA compliant message with my contact information for [facility].  Unfortunately, CSW was unable to leave a message for [facility].  CSW will continue to follow for dc needs.  Rose-Marie Hickling Tarpley-Carter, MSW, LCSW-A                  [facility] ED Transitions of CareClinical Social Worker [email or web address] [phone]

## 2020-01-27 NOTE — BH Assessment (Signed)
[facility] Assessment Progress Note  Per Marvia Pickles, NP, this pt does not require psychiatric hospitalization at this time.  Pt is psychiatrically cleared.  Discharge instructions advise pt to continue treatment with his current outpatient providers at [facility] and at [facility].  A social work consult has been ordered to address pt's psychosocial needs, and this Probation officer has spoken to [employer], [place].  Pt's nurse, Marzetta Board, has been notified.  Jalene Mullet, [place] Triage Specialist [phone]

## 2020-01-27 NOTE — Progress Notes (Addendum)
TOC CM received call from [facility], [employer], # [phone]. The Director, [facility] will pick pt up in an hour to transport him back to facility. Pt will be transitioning tomorrow to a different facility. [facility] Males states they have all that arranged. [place], [place] ED TOC CM [phone]
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
## 2020-01-27 NOTE — Discharge Instructions (Signed)
For your behavioral health needs, you are advised to continue treatment with your regular outpatient providers:       [facility] [address] [place], [place] [address]      [phone]       [facility]      [address] [place], [place] [address]      [phone]

## 2020-01-27 NOTE — TOC Initial Note (Signed)
Transition of Care [facility]) - Initial/Assessment Note    Patient Details  Name: Daniel Holmes MRN: [id number] Date of Birth: 1970-07-15  Transition of Care [facility] CM/SW Contact:    Daniel Holmes, [place] Phone Number: 01/27/2020, 2:14 PM  Clinical Narrative:                  TOC CSW received a call from Daniel Holmes, Counselor in regards to pts placement.  CSW spoke with pt and received permission to speak to his mother, care coordinator, and facility.  CSW spoke with pts mom/Daniel Holmes, [phone].  Daniel Holmes is not currently his legal guardian, but has a court date for 03/07/2020.  Daniel Holmes also provided CSW with Care Coordinator/Daniel Holmes's number.    CSW spoke with Administrator, sports at [facility] [phone].  Daniel asked if we could postpone sending pt back to [facility] until she calls back.  Daniel Holmes wants to see if new placement ([facility]) will accept pt today.  CSW will continue to follow for dc needs.  Daniel Holmes, MSW, LCSW-A                  Daniel Holmes ED Transitions of CareClinical Social Worker [email or web address] [phone]        Patient Goals and CMS Choice  To return pt back to [facility].      Expected Discharge Plan and Services  Return to [facility] or new placement at [facility].                                              Prior Living Arrangements/Services  [facility]/Daniel Holmes is the contact person at [phone].                     Activities of Daily Living Home Assistive Devices/Equipment: None ADL Screening (condition at time of admission) Patient's cognitive ability adequate to safely complete daily activities?: Yes Is the patient deaf or have difficulty hearing?: No Does the patient have difficulty seeing, even when wearing glasses/contacts?: No Does the patient have difficulty concentrating, remembering, or  making decisions?: No Patient able to express need for assistance with ADLs?: Yes Does the patient have difficulty dressing or bathing?: No Independently performs ADLs?: Yes (appropriate for developmental age) Does the patient have difficulty walking or climbing stairs?: No Weakness of Legs: None Weakness of Arms/Hands: None  Permission Sought/Granted                  Emotional Assessment              Admission diagnosis:  voluntary; psych eval Patient Active Problem List   Diagnosis Date Noted  . High risk medication use 11/14/2017  . Dry eyes 11/14/2017  . Upper airway cough syndrome 03/01/2017  . Bilateral knee pain 02/08/2017  . Encounter for psychological evaluation 09/29/2015  . Bipolar 1 disorder ([place]) 11/08/2014  . 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 Bipolar disorder with depression ([place]) 11/03/2014  . 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 History of ADHD 11/03/2014  . Other seasonal allergic rhinitis 11/05/2012  . Hypothyroidism 03/28/2012   PCP:  Daniel Covert, MD Pharmacy:   [facility], [place] [address] [place] [address] Phone: [phone]  Fax: [phone]     Social Determinants of Health (SDOH) Interventions    Readmission Risk Interventions No flowsheet data found.

## 2020-01-27 NOTE — ED Provider Notes (Signed)
Emergency Medicine Observation Re-evaluation Note  Daniel Holmes is a 49 y.o. male, seen on rounds today.  Pt initially presented to the ED for complaints of Psychiatric Evaluation Currently, the patient is awaiting psychiatry disposition.  Physical Exam  BP 116/77 (BP Location: Right Arm)   Pulse 60   Temp 98.9 F (37.2 C) (Oral)   Resp 17   Ht 5\' 6"  (1.676 m)   Wt 68 kg   SpO2 99%   BMI 24.21 kg/m  Physical Exam General: No acute distress Cardiac: Good perfusion Lungs: Even unlabored respirations. Psych: Confused but calm and cooperative.   ED Course / MDM  EKG:    I have reviewed the labs performed to date as well as medications administered while in observation.  Recent changes in the last 24 hours include N/A.  Plan  Current plan is for likely discharge later today with SW consultation. Patient is not under full IVC at this time.   Margette Fast, MD 01/28/20 [phone]

## 2020-02-26 ENCOUNTER — Other Ambulatory Visit: Payer: Self-pay | Admitting: Family Medicine

## 2020-02-26 NOTE — Telephone Encounter (Signed)
Daniel Holmes is calling stating that the patient use to use [facility] pharmacy and he no longer is using that one he is using [facility]. [name] has been calling [facility] to get items transferred and they will not do it. So they are trying to get the doctor to call in all new orders for medications.  If you need to contact the nurse where the patient is located. Nurses name: Caren Macadam [phone]. Thanks

## 2020-03-01 NOTE — Telephone Encounter (Signed)
Received phone call from [facility] regarding patient's medications. Linked needed medications to this encounter and updated patient's pharmacy.   To PCP  Please advise  Talbot Grumbling, RN

## 2020-03-02 ENCOUNTER — Ambulatory Visit: Payer: Self-pay | Admitting: Family Medicine

## 2020-03-02 MED ORDER — CETIRIZINE HCL 10 MG PO TABS: 10.0000 mg | ORAL_TABLET | Freq: Every day | ORAL | 3 refills | Status: DC

## 2020-03-02 MED ORDER — VITAMIN E 200 UNITS PO CAPS: ORAL_CAPSULE | ORAL | 11 refills | Status: DC

## 2020-03-02 MED ORDER — SENTRY SENIOR PO TABS: 1.0000 | ORAL_TABLET | Freq: Every day | ORAL | 11 refills | Status: DC

## 2020-03-02 MED ORDER — SALINE NASAL SPRAY 0.65 % NA SOLN: NASAL | 11 refills | Status: DC

## 2020-03-10 ENCOUNTER — Ambulatory Visit (INDEPENDENT_AMBULATORY_CARE_PROVIDER_SITE_OTHER): Payer: Medicaid Other | Admitting: Family Medicine

## 2020-03-10 ENCOUNTER — Encounter: Payer: Self-pay | Admitting: Family Medicine

## 2020-03-10 ENCOUNTER — Other Ambulatory Visit: Payer: Self-pay

## 2020-03-10 VITALS — BP 120/75 | HR 75 | Ht 68.11 in | Wt 171.4 lb

## 2020-03-10 DIAGNOSIS — Z23 Encounter for immunization: Secondary | ICD-10-CM

## 2020-03-10 DIAGNOSIS — R058 Other specified cough: Secondary | ICD-10-CM

## 2020-03-10 DIAGNOSIS — Z1159 Encounter for screening for other viral diseases: Secondary | ICD-10-CM | POA: Diagnosis not present

## 2020-03-10 DIAGNOSIS — R635 Abnormal weight gain: Secondary | ICD-10-CM | POA: Diagnosis not present

## 2020-03-10 DIAGNOSIS — Z131 Encounter for screening for diabetes mellitus: Secondary | ICD-10-CM

## 2020-03-10 DIAGNOSIS — E038 Other specified hypothyroidism: Secondary | ICD-10-CM

## 2020-03-10 NOTE — Assessment & Plan Note (Signed)
Likely due to diet.  Check thyroid labs and A1c.  Encourage to work on diet

## 2020-03-10 NOTE — Assessment & Plan Note (Signed)
Will check TSH.  Continue current medications

## 2020-03-10 NOTE — Patient Instructions (Signed)
Good to see you  Keep taking all the medications as you are  We will check blood tests  I will call if any problems and send a letter with your results  Come back in 3 months for a weight check

## 2020-03-10 NOTE — Assessment & Plan Note (Signed)
No cough during visit.  Seems stable on current medications.  Will continue

## 2020-03-10 NOTE — Progress Notes (Signed)
    SUBJECTIVE:   CHIEF COMPLAINT / HPI:   HYPOTHYROIDISM Weight changes: increasing about 20 lb  Skin Changes: no Palpitations: no  Compliance:  Taking replacement daily    WEIGHT GAIN Gaining weight with increaed intake of sodas and sweet foods.  Caretaker is working with him to decrease  COUGH Continues to have intermittent cough.  Has stopped smoking.  Not using inhalers or PPI.  Cough is not bothering him    PERTINENT  PMH / PSH: has new care taker - Daniel Holmes who brings his MAR etc  OBJECTIVE:   BP 120/75   Pulse 75   Ht 5' 8.11" (1.73 m)   Wt 171 lb 6.4 oz (77.7 kg)   SpO2 98%   BMI 25.98 kg/m   Alert cooperative Lungs:  Normal respiratory effort, chest expands symmetrically. Lungs are clear to auscultation, no crackles or wheezes. Neck:  No deformities, thyromegaly, masses, or tenderness noted.   Supple with full range of motion without pain.  Heart - Regular rate and rhythm.  No murmurs, gallops or rubs.    Extremities:  No cyanosis, edema, or deformity noted with good range of motion of all major joints.      ASSESSMENT/PLAN:   Hypothyroidism Will check TSH.  Continue current medications   Upper airway cough syndrome No cough during visit.  Seems stable on current medications.  Will continue   Weight gain Likely due to diet.  Check thyroid labs and A1c.  Encourage to work on [place], MD [place]

## 2020-03-11 ENCOUNTER — Encounter: Payer: Self-pay | Admitting: Family Medicine

## 2020-03-11 LAB — TSH: TSH: 0.965 u[IU]/mL (ref 0.450–4.500)

## 2020-03-11 LAB — HEPATITIS C ANTIBODY: Hep C Virus Ab: <0.1 s/co ratio (ref 0.0–0.9)

## 2020-04-21 ENCOUNTER — Other Ambulatory Visit: Payer: Medicaid Other

## 2020-04-30 ENCOUNTER — Telehealth: Payer: Self-pay | Admitting: *Deleted

## 2020-04-30 MED ORDER — LEVOCETIRIZINE DIHYDROCHLORIDE 5 MG PO TABS: 5.0000 mg | ORAL_TABLET | Freq: Every evening | ORAL | 1 refills | Status: DC

## 2020-04-30 NOTE — Telephone Encounter (Signed)
Rx sent for Xyzal with is very similar to cetirizine

## 2020-04-30 NOTE — Telephone Encounter (Signed)
Rx request for cetirizine 10mg  tab. Please advise. Johnrobert Foti Kennon Holter, CMA

## 2020-06-30 ENCOUNTER — Other Ambulatory Visit: Payer: Self-pay

## 2020-07-01 MED ORDER — LEVOTHYROXINE SODIUM 75 MCG PO TABS: 75.0000 ug | ORAL_TABLET | Freq: Every day | ORAL | 1 refills | Status: DC

## 2020-11-09 ENCOUNTER — Other Ambulatory Visit: Payer: Self-pay

## 2020-11-09 MED ORDER — SALINE NASAL SPRAY 0.65 % NA SOLN: NASAL | 11 refills | Status: AC

## 2020-11-09 MED ORDER — LEVOTHYROXINE SODIUM 75 MCG PO TABS: 75.0000 ug | ORAL_TABLET | Freq: Every day | ORAL | 1 refills | Status: DC

## 2020-11-10 ENCOUNTER — Emergency Department (HOSPITAL_COMMUNITY)
Admission: EM | Admit: 2020-11-10 | Discharge: 2020-11-10 | Disposition: A | Discharge status: Home / Self Care | Payer: Medicaid Other | Attending: Emergency Medicine | Admitting: Emergency Medicine

## 2020-11-10 ENCOUNTER — Other Ambulatory Visit: Payer: Self-pay

## 2020-11-10 ENCOUNTER — Encounter (HOSPITAL_COMMUNITY): Payer: Self-pay

## 2020-11-10 DIAGNOSIS — E039 Hypothyroidism, unspecified: Secondary | ICD-10-CM | POA: Diagnosis not present

## 2020-11-10 DIAGNOSIS — F419 Anxiety disorder, unspecified: Secondary | ICD-10-CM | POA: Insufficient documentation

## 2020-11-10 DIAGNOSIS — J45909 Unspecified asthma, uncomplicated: Secondary | ICD-10-CM | POA: Diagnosis not present

## 2020-11-10 DIAGNOSIS — Z79899 Other long term (current) drug therapy: Secondary | ICD-10-CM | POA: Insufficient documentation

## 2020-11-10 DIAGNOSIS — Z046 Encounter for general psychiatric examination, requested by authority: Secondary | ICD-10-CM | POA: Diagnosis present

## 2020-11-10 DIAGNOSIS — R4689 Other symptoms and signs involving appearance and behavior: Secondary | ICD-10-CM

## 2020-11-10 DIAGNOSIS — Z87891 Personal history of nicotine dependence: Secondary | ICD-10-CM | POA: Diagnosis not present

## 2020-11-10 DIAGNOSIS — Y900 Blood alcohol level of less than 20 mg/100 ml: Secondary | ICD-10-CM | POA: Insufficient documentation

## 2020-11-10 LAB — RAPID URINE DRUG SCREEN, HOSP PERFORMED
Amphetamines: NOT DETECTED
Barbiturates: NOT DETECTED
Benzodiazepines: NOT DETECTED
Cocaine: NOT DETECTED
Opiates: NOT DETECTED
Tetrahydrocannabinol: NOT DETECTED

## 2020-11-10 LAB — COMPREHENSIVE METABOLIC PANEL
ALT: 16 U/L (ref 0–44)
AST: 20 U/L (ref 15–41)
Albumin: 5.1 g/dL — ABNORMAL HIGH (ref 3.5–5.0)
Alkaline Phosphatase: 97 U/L (ref 38–126)
Anion gap: 5 (ref 5–15)
BUN: 16 mg/dL (ref 6–20)
CO2: 24 mmol/L (ref 22–32)
Calcium: 10.4 mg/dL — ABNORMAL HIGH (ref 8.9–10.3)
Chloride: 109 mmol/L (ref 98–111)
Creatinine, Ser: 0.97 mg/dL (ref 0.61–1.24)
GFR, Estimated: >60 mL/min (ref >60)
Glucose, Bld: 101 mg/dL — ABNORMAL HIGH (ref 70–99)
Potassium: 4.2 mmol/L (ref 3.5–5.1)
Sodium: 138 mmol/L (ref 135–145)
Total Bilirubin: 0.6 mg/dL (ref 0.3–1.2)
Total Protein: 8.1 g/dL (ref 6.5–8.1)

## 2020-11-10 LAB — CBC WITH DIFFERENTIAL/PLATELET
Abs Immature Granulocytes: 0.03 10*3/uL (ref 0.00–0.07)
Basophils Absolute: 0 10*3/uL (ref 0.0–0.1)
Basophils Relative: 0 %
Eosinophils Absolute: 0 10*3/uL (ref 0.0–0.5)
Eosinophils Relative: 0 %
HCT: 40.5 % (ref 39.0–52.0)
Hemoglobin: 14 g/dL (ref 13.0–17.0)
Immature Granulocytes: 0 %
Lymphocytes Relative: 16 %
Lymphs Abs: 1.3 10*3/uL (ref 0.7–4.0)
MCH: 32 pg (ref 26.0–34.0)
MCHC: 34.6 g/dL (ref 30.0–36.0)
MCV: 92.7 fL (ref 80.0–100.0)
Monocytes Absolute: 0.4 10*3/uL (ref 0.1–1.0)
Monocytes Relative: 6 %
Neutro Abs: 6.1 10*3/uL (ref 1.7–7.7)
Neutrophils Relative %: 78 %
Platelets: 235 10*3/uL (ref 150–400)
RBC: 4.37 MIL/uL (ref 4.22–5.81)
RDW: 12.3 % (ref 11.5–15.5)
WBC: 7.9 10*3/uL (ref 4.0–10.5)
nRBC: 0 % (ref 0.0–0.2)

## 2020-11-10 LAB — ETHANOL: Alcohol, Ethyl (B): <10 mg/dL (ref <10)

## 2020-11-10 LAB — ACETAMINOPHEN LEVEL: Acetaminophen (Tylenol), Serum: <10 ug/mL — ABNORMAL LOW (ref 10–30)

## 2020-11-10 LAB — SALICYLATE LEVEL: Salicylate Lvl: <7 mg/dL — ABNORMAL LOW (ref 7.0–30.0)

## 2020-11-10 LAB — LITHIUM LEVEL: Lithium Lvl: 0.92 mmol/L (ref 0.60–1.20)

## 2020-11-10 MED ORDER — OLANZAPINE 10 MG PO TABS
30.0000 mg | ORAL_TABLET | Freq: Once | ORAL | Status: AC
  Administered 2020-11-10: 30 mg via ORAL
  Filled 2020-11-10: qty 3

## 2020-11-10 MED ORDER — LORAZEPAM 1 MG PO TABS: 1.0000 mg | ORAL_TABLET | Freq: Once | ORAL | Status: DC

## 2020-11-10 MED ORDER — LORAZEPAM 1 MG PO TABS
1.0000 mg | ORAL_TABLET | Freq: Once | ORAL | Status: AC
  Administered 2020-11-10: 1 mg via ORAL
  Filled 2020-11-10: qty 1

## 2020-11-10 MED ORDER — HYDROXYZINE HCL 25 MG PO TABS: 25.0000 mg | ORAL_TABLET | Freq: Three times a day (TID) | ORAL | 0 refills | Status: AC | PRN

## 2020-11-10 NOTE — ED Triage Notes (Signed)
Per provider from home "pt does not like following rules and threatened to kill self by poking eyeballs out. Superficial abrasion noted to arms from scratching.

## 2020-11-10 NOTE — ED Notes (Signed)
Pt began having erratic behavior when informed he was going to be discharged and to put his clothes back on. Security called to bedside. Pt was able to be redirected and changed into his clothes. Pt escorted out of ED by security with caregiver.

## 2020-11-10 NOTE — Discharge Instructions (Addendum)
It was our pleasure to provide your ER care today - we hope that you feel better.  Please make sure to follow all house rules and be the most cooperative, and best house mate that you can be - the rules are there to help you, because people care about you, and provide the best living situation possible.   Follow up with your doctor/therapist in the next 1-2 weeks. Make sure to be good about taking your meds. You may also take vistaril as need for anxiety.   For mental health issues and/or crisis, you may go to the [facility] - it is open 24/7 and walk-ins are welcome.   Return to ER if worse, new symptoms, fevers, trouble breathing, or other emergency concern.

## 2020-11-10 NOTE — ED Provider Notes (Signed)
Emergency Medicine Provider Triage Evaluation Note  Daniel Holmes , a 50 y.o. male  was evaluated in triage.  Pt threatening suicide. Scratched his arms in self harm on the way here. Accompanied by case worker.    Review of Systems  Positive: SI, self harm Negative: CP, SOB, head injury, other physical complaints  Physical Exam  BP 120/86   Pulse 84   Temp 98.5 F (36.9 C) (Oral)   Resp 18   Ht 5\' 8"  (1.727 m)   Wt 77 kg   SpO2 97%   BMI 25.81 kg/m   Gen:   Awake, no distress   Resp:  Normal effort  MSK:   Moves extremities without difficulty  Other:    Medical Decision Making  Medically screening exam initiated at 4:08 PM.  Appropriate orders placed.  Daniel Holmes was informed that the remainder of the evaluation will be completed by another provider, this initial triage assessment does not replace that evaluation, and the importance of remaining in the ED until their evaluation is complete.     Daniel Bender, PA-C 11/10/20 1655    Daniel Saver, MD 11/11/20 [phone]

## 2020-11-10 NOTE — ED Provider Notes (Signed)
Daniel [facility] DEPT Provider Note   CSN: [id number] Arrival date & time: 11/10/20  1547     History Chief Complaint  Patient presents with   Psychiatric Evaluation    Daniel Holmes is a 50 y.o. male.  Patient with DD, bipolar, presents with recent behavioral symptoms. Pt is limited historian - level 5 caveat.  Recent superficial abrasions to arms. No other attempts to harm self. No report of ingestion. No current SI. Caregiver indicates patient has been at her ALF since 01-2020, and that this is typical of patients behavior - she states he has issues w anxiety, and he doesn't like rules - so when rules are enforced, or he doesn't get his way, or gets anxious - he behaves in this manner, mainly to get attention. She indicates he was had routine visit with his psychiatrist earlier today, Dr Darleene Cleaver, and as things were going his way then, he brought up no issues.  No recent change in meds. Pt generally compliant w meds. No recent physical illness.   The history is provided by the patient and a caregiver. The history is limited by the condition of the patient.      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   Bipolar disorder with depression ([place]) 11/03/2014   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   History of ADHD 11/03/2014   Other seasonal allergic rhinitis 11/05/2012   Hypothyroidism 03/28/2012    Past Surgical History:  Procedure Laterality Date   HERNIA REPAIR         Family  History  Problem Relation Age of Onset   Colon cancer Neg Hx    Esophageal cancer Neg Hx    Stomach cancer Neg Hx    Rectal cancer Neg Hx     Social History   Tobacco Use   Smoking status: Former    Packs/day: 1.00    Years: 10.00    Pack years: 10.00    Types: Cigarettes    Quit date: 05/29/1998    Years since quitting: 22.4   Smokeless tobacco: Never  Vaping Use   Vaping Use: Never used  Substance Use Topics   Alcohol use: No   Drug use: No    Home Medications Prior to Admission medications   Medication Sig Start Date End Date Taking? Authorizing Provider  cloNIDine (CATAPRES) 0.1 MG tablet Take 0.1 mg by mouth 2 (two) times daily.    [provider]  lamoTRIgine (LAMICTAL) 100 MG tablet Take 100 mg by mouth daily.     [provider]  levocetirizine (XYZAL) 5 MG tablet Take 1 tablet (5 mg total) by mouth every evening. 04/30/20   Lind Covert, MD  levothyroxine (SYNTHROID) 75 MCG tablet Take 1 tablet (75 mcg total) by mouth daily before breakfast. 11/09/20   Lind Covert, MD  lithium carbonate (ESKALITH) 450 MG CR tablet Take 1 tablet (450 mg total) by mouth every 12 (twelve) hours. 11/10/14  Pucilowska, Jolanta B, MD  Multiple Vitamins-Minerals (SENTRY SENIOR) TABS Take 1 tablet by mouth daily. 03/02/20   Chambliss, Jeb Levering, MD  OLANZapine (ZYPREXA) 20 MG tablet Take 1.5 tablets (30 mg total) by mouth at bedtime. Take 1 and 1/2 tablets by mouth at bedtime. Patient taking differently: Take 20 mg by mouth at bedtime.  11/10/14   Pucilowska, Herma Ard B, MD  QUEtiapine (SEROQUEL) 100 MG tablet Take 100 mg by mouth at bedtime.    [provider]  sodium chloride (SALINE MIST) 0.65 % nasal spray USE 2 SPRAYS IN EACH NOSTRIL 4 TIMES DAILY. 11/09/20   Lind Covert, MD  traZODone (DESYREL) 100 MG tablet Take 100 mg by mouth at bedtime.    [provider]    Allergies    Bee pollen and Penicillins  Review of Systems    Review of Systems  Constitutional:  Negative for fever.  HENT:  Negative for trouble swallowing.   Eyes:  Negative for redness.  Respiratory:  Negative for shortness of breath.   Cardiovascular:  Negative for chest pain.  Gastrointestinal:  Negative for vomiting.  Genitourinary:  Negative for flank pain.  Musculoskeletal:  Negative for back pain.  Skin:  Negative for rash.  Neurological:  Negative for headaches.  Hematological:  Does not bruise/bleed easily.  Psychiatric/Behavioral:  Positive for agitation. The patient is nervous/anxious.    Physical Exam Updated Vital Signs BP 134/78 (BP Location: Right Arm)   Pulse 73   Temp 98.1 F (36.7 C) (Oral)   Resp 16   Ht 1.727 m (5\' 8" )   Wt 77 kg   SpO2 97%   BMI 25.81 kg/m   Physical Exam Vitals and nursing note reviewed.  Constitutional:      Appearance: Normal appearance. He is well-developed.  HENT:     Head: Atraumatic.     Nose: Nose normal.     Mouth/Throat:     Mouth: Mucous membranes are moist.     Pharynx: Oropharynx is clear.  Eyes:     General: No scleral icterus.    Conjunctiva/sclera: Conjunctivae normal.     Pupils: Pupils are equal, round, and reactive to light.  Neck:     Trachea: No tracheal deviation.  Cardiovascular:     Rate and Rhythm: Normal rate and regular rhythm.     Pulses: Normal pulses.     Heart sounds: Normal heart sounds. No murmur heard.   No friction rub. No gallop.  Pulmonary:     Effort: Pulmonary effort is normal. No accessory muscle usage or respiratory distress.     Breath sounds: Normal breath sounds.  Abdominal:     General: Bowel sounds are normal. There is no distension.     Palpations: Abdomen is soft.     Tenderness: There is no abdominal tenderness.  Genitourinary:    Comments: No cva tenderness. Musculoskeletal:        General: No swelling.     Cervical back: Normal range of motion and neck supple. No rigidity.  Skin:    General: Skin is warm and dry.      Findings: No rash.     Comments: Very superficial scratch marked to small area of volar forearm bil.   Neurological:     Mental Status: He is alert.     Comments: Alert, speech clear. Motor/sens grossly intact bil. Steady gait.   Psychiatric:     Comments: Mildly anxious. No SI. Does not appear to be responding to internal  stimuli, no acute delusions or hallucinations noted.     ED Results / Procedures / Treatments   Labs (all labs ordered are listed, but only abnormal results are displayed) Results for orders placed or performed during the hospital encounter of 11/10/20  Comprehensive metabolic panel  Result Value Ref Range   Sodium 138 135 - 145 mmol/L   Potassium 4.2 3.5 - 5.1 mmol/L   Chloride 109 98 - 111 mmol/L   CO2 24 22 - 32 mmol/L   Glucose, Bld 101 (H) 70 - 99 mg/dL   BUN 16 6 - 20 mg/dL   Creatinine, Ser 0.97 0.61 - 1.24 mg/dL   Calcium 10.4 (H) 8.9 - 10.3 mg/dL   Total Protein 8.1 6.5 - 8.1 g/dL   Albumin 5.1 (H) 3.5 - 5.0 g/dL   AST 20 15 - 41 U/L   ALT 16 0 - 44 U/L   Alkaline Phosphatase 97 38 - 126 U/L   Total Bilirubin 0.6 0.3 - 1.2 mg/dL   GFR, Estimated >60 >60 mL/min   Anion gap 5 5 - 15  Ethanol  Result Value Ref Range   Alcohol, Ethyl (B) <10 <10 mg/dL  CBC with Diff  Result Value Ref Range   WBC 7.9 4.0 - 10.5 K/uL   RBC 4.37 4.22 - 5.81 MIL/uL   Hemoglobin 14.0 13.0 - 17.0 g/dL   HCT 40.5 39.0 - 52.0 %   MCV 92.7 80.0 - 100.0 fL   MCH 32.0 26.0 - 34.0 pg   MCHC 34.6 30.0 - 36.0 g/dL   RDW 12.3 11.5 - 15.5 %   Platelets 235 150 - 400 K/uL   nRBC 0.0 0.0 - 0.2 %   Neutrophils Relative % 78 %   Neutro Abs 6.1 1.7 - 7.7 K/uL   Lymphocytes Relative 16 %   Lymphs Abs 1.3 0.7 - 4.0 K/uL   Monocytes Relative 6 %   Monocytes Absolute 0.4 0.1 - 1.0 K/uL   Eosinophils Relative 0 %   Eosinophils Absolute 0.0 0.0 - 0.5 K/uL   Basophils Relative 0 %   Basophils Absolute 0.0 0.0 - 0.1 K/uL   Immature Granulocytes 0 %   Abs Immature Granulocytes 0.03  0.00 - 0.07 K/uL  Acetaminophen level  Result Value Ref Range   Acetaminophen (Tylenol), Serum <10 (L) 10 - 30 ug/mL  Salicylate level  Result Value Ref Range   Salicylate Lvl <9.9 (L) 7.0 - 30.0 mg/dL  Lithium level  Result Value Ref Range   Lithium Lvl 0.92 0.60 - 1.20 mmol/L    EKG None  Radiology No results found.  Procedures Procedures   Medications Ordered in ED Medications  LORazepam (ATIVAN) tablet 1 mg (has no administration in time range)    ED Course  I have reviewed the triage vital signs and the nursing notes.  Pertinent labs & imaging results that were available during my care of the patient were reviewed by me and considered in my medical decision making (see chart for details).    MDM Rules/Calculators/A&P                         Labs.   Reviewed nursing notes and prior charts for additional history.   Labs reviewed/interpreted by me - chem normal.  Pt given his pm dose of zyprexa, also given ativan 1 mg po.   Patient asking for drinks - provided.   Pt currently appears stable for d/c.  His symptoms  appear behavioral, c/w prior.      Final Clinical Impression(s) / ED Diagnoses Final diagnoses:  None    Rx / DC Orders ED Discharge Orders     None        [name], MD 11/10/20 2028

## 2020-11-24 ENCOUNTER — Other Ambulatory Visit: Payer: Self-pay | Admitting: Family Medicine

## 2020-11-24 ENCOUNTER — Other Ambulatory Visit: Payer: Self-pay

## 2021-01-28 ENCOUNTER — Other Ambulatory Visit: Payer: Self-pay | Admitting: *Deleted

## 2021-02-01 MED ORDER — SENTRY SENIOR PO TABS: 1.0000 | ORAL_TABLET | Freq: Every day | ORAL | 11 refills | Status: DC

## 2021-02-03 ENCOUNTER — Other Ambulatory Visit: Payer: Self-pay | Admitting: Family Medicine

## 2021-02-28 ENCOUNTER — Other Ambulatory Visit: Payer: Self-pay | Admitting: Family Medicine

## 2021-03-29 ENCOUNTER — Other Ambulatory Visit: Payer: Self-pay | Admitting: Family Medicine

## 2021-03-30 ENCOUNTER — Other Ambulatory Visit: Payer: Self-pay

## 2021-03-30 ENCOUNTER — Encounter (HOSPITAL_COMMUNITY): Payer: Self-pay | Admitting: Emergency Medicine

## 2021-03-30 ENCOUNTER — Emergency Department (HOSPITAL_COMMUNITY)
Admission: EM | Admit: 2021-03-30 | Discharge: 2021-03-31 | Disposition: A | Discharge status: Home / Self Care | Payer: Medicaid Other | Attending: Emergency Medicine | Admitting: Emergency Medicine

## 2021-03-30 DIAGNOSIS — E039 Hypothyroidism, unspecified: Secondary | ICD-10-CM | POA: Insufficient documentation

## 2021-03-30 DIAGNOSIS — F3110 Bipolar disorder, current episode manic without psychotic features, unspecified: Secondary | ICD-10-CM | POA: Diagnosis not present

## 2021-03-30 DIAGNOSIS — Z20822 Contact with and (suspected) exposure to covid-19: Secondary | ICD-10-CM | POA: Diagnosis not present

## 2021-03-30 DIAGNOSIS — F70 Mild intellectual disabilities: Secondary | ICD-10-CM | POA: Diagnosis not present

## 2021-03-30 DIAGNOSIS — Z79899 Other long term (current) drug therapy: Secondary | ICD-10-CM | POA: Diagnosis not present

## 2021-03-30 DIAGNOSIS — Z87891 Personal history of nicotine dependence: Secondary | ICD-10-CM | POA: Diagnosis not present

## 2021-03-30 DIAGNOSIS — R45851 Suicidal ideations: Secondary | ICD-10-CM | POA: Insufficient documentation

## 2021-03-30 LAB — RESP PANEL BY RT-PCR (FLU A&B, COVID) ARPGX2
Influenza A by PCR: NEGATIVE
Influenza B by PCR: NEGATIVE
SARS Coronavirus 2 by RT PCR: NEGATIVE

## 2021-03-30 LAB — CBC WITH DIFFERENTIAL/PLATELET
Abs Immature Granulocytes: 0.02 10*3/uL (ref 0.00–0.07)
Basophils Absolute: 0 10*3/uL (ref 0.0–0.1)
Basophils Relative: 0 %
Eosinophils Absolute: 0 10*3/uL (ref 0.0–0.5)
Eosinophils Relative: 0 %
HCT: 37.3 % — ABNORMAL LOW (ref 39.0–52.0)
Hemoglobin: 13.1 g/dL (ref 13.0–17.0)
Immature Granulocytes: 0 %
Lymphocytes Relative: 13 %
Lymphs Abs: 1.1 10*3/uL (ref 0.7–4.0)
MCH: 31.9 pg (ref 26.0–34.0)
MCHC: 35.1 g/dL (ref 30.0–36.0)
MCV: 90.8 fL (ref 80.0–100.0)
Monocytes Absolute: 0.4 10*3/uL (ref 0.1–1.0)
Monocytes Relative: 5 %
Neutro Abs: 6.6 10*3/uL (ref 1.7–7.7)
Neutrophils Relative %: 82 %
Platelets: 192 10*3/uL (ref 150–400)
RBC: 4.11 MIL/uL — ABNORMAL LOW (ref 4.22–5.81)
RDW: 12.9 % (ref 11.5–15.5)
WBC: 8.2 10*3/uL (ref 4.0–10.5)
nRBC: 0 % (ref 0.0–0.2)

## 2021-03-30 LAB — COMPREHENSIVE METABOLIC PANEL
ALT: 19 U/L (ref 0–44)
AST: 23 U/L (ref 15–41)
Albumin: 4.6 g/dL (ref 3.5–5.0)
Alkaline Phosphatase: 78 U/L (ref 38–126)
Anion gap: 7 (ref 5–15)
BUN: 12 mg/dL (ref 6–20)
CO2: 21 mmol/L — ABNORMAL LOW (ref 22–32)
Calcium: 9.9 mg/dL (ref 8.9–10.3)
Chloride: 110 mmol/L (ref 98–111)
Creatinine, Ser: 0.85 mg/dL (ref 0.61–1.24)
GFR, Estimated: >60 mL/min (ref >60)
Glucose, Bld: 108 mg/dL — ABNORMAL HIGH (ref 70–99)
Potassium: 4 mmol/L (ref 3.5–5.1)
Sodium: 138 mmol/L (ref 135–145)
Total Bilirubin: 0.4 mg/dL (ref 0.3–1.2)
Total Protein: 7.3 g/dL (ref 6.5–8.1)

## 2021-03-30 LAB — RAPID URINE DRUG SCREEN, HOSP PERFORMED
Amphetamines: NOT DETECTED
Barbiturates: NOT DETECTED
Benzodiazepines: NOT DETECTED
Cocaine: NOT DETECTED
Opiates: NOT DETECTED
Tetrahydrocannabinol: NOT DETECTED

## 2021-03-30 LAB — ETHANOL: Alcohol, Ethyl (B): <10 mg/dL (ref <10)

## 2021-03-30 LAB — SALICYLATE LEVEL: Salicylate Lvl: <7 mg/dL — ABNORMAL LOW (ref 7.0–30.0)

## 2021-03-30 LAB — ACETAMINOPHEN LEVEL: Acetaminophen (Tylenol), Serum: <10 ug/mL — ABNORMAL LOW (ref 10–30)

## 2021-03-30 MED ORDER — LEVOTHYROXINE SODIUM 50 MCG PO TABS
75.0000 ug | ORAL_TABLET | Freq: Every day | ORAL | Status: DC
  Administered 2021-03-31: 75 ug via ORAL
  Filled 2021-03-30: qty 1

## 2021-03-30 MED ORDER — LEVOTHYROXINE SODIUM 50 MCG PO TABS: 75.0000 ug | ORAL_TABLET | Freq: Every day | ORAL | Status: DC

## 2021-03-30 MED ORDER — ACETAMINOPHEN 325 MG PO TABS
650.0000 mg | ORAL_TABLET | ORAL | Status: DC | PRN
  Administered 2021-03-31: 650 mg via ORAL
  Filled 2021-03-30: qty 2

## 2021-03-30 NOTE — ED Provider Notes (Signed)
[facility] DEPT Provider Note   CSN: [id number] Arrival date & time: 03/30/21  1830     History Chief Complaint  Patient presents with   Suicidal    Daniel Holmes is a 50 y.o. male with a past medical history significant for developmental delay, bipolar 1 disorder, and asthma who presents to the ED due to suicidal ideations.  Patient states he has plans to jump out of a window to kill himself.  Denies HI and auditory/visual hallucinations.  Caregiver is at bedside and states patient has been exhibiting aggressive behaviors at home.  Legal guardian of patient advised caregiver to use patient's money to repair items that were damaged which caused patient to get angry and developed suicidal ideations.  Patient has a history of bipolar 1 disorder.  Caregiver notes patient has been compliant with all of his medications.  Patient denies drug, alcohol, or tobacco use. He scratched his left arm prior to arrival with his fingernails. No treatment prior to arrival. No physical complaints.   Level 5 caveat secondary to mental disabilities  History obtained from patient and past medical records. No interpreter used during encounter.       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   Bipolar disorder with depression ([place]) 11/03/2014   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   History of ADHD 11/03/2014   Other seasonal allergic rhinitis 11/05/2012   Hypothyroidism 03/28/2012    Past Surgical  History:  Procedure Laterality Date   HERNIA REPAIR         Family History  Problem Relation Age of Onset   Colon cancer Neg Hx    Esophageal cancer Neg Hx    Stomach cancer Neg Hx    Rectal cancer Neg Hx     Social History   Tobacco Use   Smoking status: Former    Packs/day: 1.00    Years: 10.00    Pack years: 10.00    Types: Cigarettes    Quit date: 05/29/1998    Years since quitting: 22.8   Smokeless tobacco: Never  Vaping Use   Vaping Use: Never used  Substance Use Topics   Alcohol use: No   Drug use: No    Home Medications Prior to Admission medications   Medication Sig Start Date End Date Taking? Authorizing Provider  cetirizine (ZYRTEC) 10 MG tablet TAKE 1 TABLET BY MOUTH DAILY. Patient taking differently: Take 10 mg by mouth in the morning. 02/28/21  Yes Chambliss, Jeb Levering, MD  cloNIDine (CATAPRES) 0.1 MG tablet Take 0.1 mg by mouth in the morning and at bedtime.   Yes [provider]  hydrOXYzine (ATARAX/VISTARIL) 25 MG tablet Take 1 tablet (25 mg total) by mouth every 8 (eight) hours as needed. Patient taking differently: Take 25 mg by mouth in the morning and at bedtime. 11/10/20  Yes [name], MD  hydrOXYzine (ATARAX/VISTARIL) 50 MG tablet Take 25 mg by mouth daily as needed (for agitation).  Yes [provider]  lamoTRIgine (LAMICTAL) 100 MG tablet Take 100 mg by mouth at bedtime.   Yes [provider]  levocetirizine (XYZAL) 5 MG tablet TAKE 1 TABLET BY MOUTH AT BEDTIME. Patient taking differently: Take 5 mg by mouth at bedtime. 11/25/20  Yes Lind Covert, MD  levothyroxine (SYNTHROID) 75 MCG tablet Take 1 tablet (75 mcg total) by mouth daily before breakfast. 11/09/20  Yes Chambliss, Jeb Levering, MD  lithium carbonate (ESKALITH) 450 MG CR tablet Take 1 tablet (450 mg total) by mouth every 12 (twelve) hours. Patient taking differently: Take 450 mg by mouth in the morning and at bedtime. 11/10/14  Yes Pucilowska, Jolanta  B, MD  Multiple Vitamins-Minerals (SENTRY SENIOR) TABS Take 1 tablet by mouth daily. 02/03/21  Yes Chambliss, Jeb Levering, MD  OLANZapine (ZYPREXA) 10 MG tablet Take 10 mg by mouth in the morning and at bedtime.   Yes [provider]  QUEtiapine (SEROQUEL) 100 MG tablet Take 100 mg by mouth at bedtime.   Yes [provider]  sodium chloride (SALINE MIST) 0.65 % nasal spray USE 2 SPRAYS IN EACH NOSTRIL 4 TIMES DAILY. Patient taking differently: Place 2 sprays into the nose 4 (four) times daily. 11/09/20  Yes Lind Covert, MD  traZODone (DESYREL) 100 MG tablet Take 100 mg by mouth at bedtime.   Yes [provider]  OLANZapine (ZYPREXA) 20 MG tablet Take 1.5 tablets (30 mg total) by mouth at bedtime. Take 1 and 1/2 tablets by mouth at bedtime. Patient not taking: No sig reported 11/10/14   Pucilowska, Herma Ard B, MD    Allergies    Bee pollen, Bee venom, and Penicillins  Review of Systems   Review of Systems  Psychiatric/Behavioral:  Positive for self-injury and suicidal ideas. Negative for hallucinations.   All other systems reviewed and are negative.  Physical Exam Updated Vital Signs BP 119/74   Pulse 74   Temp 99.3 F (37.4 C) (Oral)   Resp 17   Ht 5\' 3"  (1.6 m)   Wt 74.8 kg   SpO2 99%   BMI 29.23 kg/m   Physical Exam Vitals and nursing note reviewed.  Constitutional:      General: He is not in acute distress.    Appearance: He is not ill-appearing.  HENT:     Head: Normocephalic.  Eyes:     Pupils: Pupils are equal, round, and reactive to light.  Cardiovascular:     Rate and Rhythm: Normal rate and regular rhythm.     Pulses: Normal pulses.     Heart sounds: Normal heart sounds. No murmur heard.   No friction rub. No gallop.  Pulmonary:     Effort: Pulmonary effort is normal.     Breath sounds: Normal breath sounds.  Abdominal:     General: Abdomen is flat. There is no distension.     Palpations: Abdomen is soft.     Tenderness:  There is no abdominal tenderness. There is no guarding or rebound.  Musculoskeletal:        General: Normal range of motion.     Cervical back: Neck supple.  Skin:    General: Skin is warm and dry.     Comments: Numerous superficial abrasions to left forearm.  Neurological:     General: No focal deficit present.     Mental Status: He is alert.  Psychiatric:        Mood and Affect: Mood normal.  Behavior: Behavior normal.    ED Results / Procedures / Treatments   Labs (all labs ordered are listed, but only abnormal results are displayed) Labs Reviewed  COMPREHENSIVE METABOLIC PANEL - Abnormal; Notable for the following components:      Result Value   CO2 21 (*)    Glucose, Bld 108 (*)    All other components within normal limits  CBC WITH DIFFERENTIAL/PLATELET - Abnormal; Notable for the following components:   RBC 4.11 (*)    HCT 37.3 (*)    All other components within normal limits  ACETAMINOPHEN LEVEL - Abnormal; Notable for the following components:   Acetaminophen (Tylenol), Serum <10 (*)    All other components within normal limits  SALICYLATE LEVEL - Abnormal; Notable for the following components:   Salicylate Lvl <7.8 (*)    All other components within normal limits  RESP PANEL BY RT-PCR (FLU A&B, COVID) ARPGX2  ETHANOL  RAPID URINE DRUG SCREEN, HOSP PERFORMED    EKG None  Radiology No results found.  Procedures Procedures   Medications Ordered in ED Medications  acetaminophen (TYLENOL) tablet 650 mg (has no administration in time range)    ED Course  I have reviewed the triage vital signs and the nursing notes.  Pertinent labs & imaging results that were available during my care of the patient were reviewed by me and considered in my medical decision making (see chart for details).    MDM Rules/Calculators/A&P                           50 year old male with a history of bipolar 1 disorder and mental disabilities presents to the ED due to  suicidal ideations.  Caregiver is at bedside.  Upon arrival, vitals all within normal limits.  Patient no acute distress.  Superficial abrasions to left forearm.  Patient is here voluntarily.  Medical clearance labs ordered.  CBC reassuring with no leukocytosis and normal hemoglobin.  CMP reassuring.  Normal renal function.  No major electrolyte derangements.  UDS negative.  Ethanol, acetaminophen, and salicylate level within normal limits.  Patient has been medically cleared for TTS evaluation.  The patient has been placed in psychiatric observation due to the need to provide a safe environment for the patient while obtaining psychiatric consultation and evaluation, as well as ongoing medical and medication management to treat the patient's condition.  The patient has not been placed under full IVC at this time.  Final Clinical Impression(s) / ED Diagnoses Final diagnoses:  Suicidal ideation    Rx / DC Orders ED Discharge Orders     None        Karie Kirks 03/30/21 2353    Fredia Sorrow, MD 04/15/21 [phone]

## 2021-03-30 NOTE — ED Triage Notes (Signed)
Pt presents with caregiver with c/o suicidal ideation. Pt does report having a plan to jump out the window to kill himself. Pt has scratches on his left arm in an attempt to self harm.

## 2021-03-30 NOTE — ED Notes (Signed)
Patient's caregiver informed staff that his mother is his legal guardian. Patient's mother, Yigit Norkus, gave verbal witness that patient is unable to sign MSE.

## 2021-03-30 NOTE — ED Notes (Signed)
Pt has been changed out into wine colored scrubs, wanded by security, and his belongings (1 white bag) placed under the ice machine in triage.

## 2021-03-31 ENCOUNTER — Other Ambulatory Visit: Payer: Self-pay | Admitting: Family Medicine

## 2021-03-31 LAB — LITHIUM LEVEL: Lithium Lvl: 0.39 mmol/L — ABNORMAL LOW (ref 0.60–1.20)

## 2021-03-31 MED ORDER — LAMOTRIGINE 100 MG PO TABS: 100.0000 mg | ORAL_TABLET | Freq: Every day | ORAL | Status: DC

## 2021-03-31 MED ORDER — OLANZAPINE 10 MG PO TABS
10.0000 mg | ORAL_TABLET | Freq: Two times a day (BID) | ORAL | Status: DC
  Administered 2021-03-31: 10 mg via ORAL
  Filled 2021-03-31: qty 1

## 2021-03-31 MED ORDER — HYDROXYZINE HCL 25 MG PO TABS
25.0000 mg | ORAL_TABLET | Freq: Every day | ORAL | Status: DC | PRN
  Administered 2021-03-31: 25 mg via ORAL
  Filled 2021-03-31: qty 1

## 2021-03-31 MED ORDER — HYDROXYZINE HCL 25 MG PO TABS
25.0000 mg | ORAL_TABLET | Freq: Two times a day (BID) | ORAL | Status: DC
  Administered 2021-03-31: 25 mg via ORAL
  Filled 2021-03-31: qty 1

## 2021-03-31 MED ORDER — BENZONATATE 100 MG PO CAPS
100.0000 mg | ORAL_CAPSULE | Freq: Once | ORAL | Status: AC
  Administered 2021-03-31: 100 mg via ORAL
  Filled 2021-03-31: qty 1

## 2021-03-31 MED ORDER — CLONIDINE HCL 0.1 MG PO TABS
0.1000 mg | ORAL_TABLET | Freq: Two times a day (BID) | ORAL | Status: DC
  Administered 2021-03-31: 0.1 mg via ORAL
  Filled 2021-03-31: qty 1

## 2021-03-31 MED ORDER — TRAZODONE HCL 100 MG PO TABS: 100.0000 mg | ORAL_TABLET | Freq: Every day | ORAL | Status: DC

## 2021-03-31 MED ORDER — LITHIUM CARBONATE ER 450 MG PO TBCR: 450.0000 mg | EXTENDED_RELEASE_TABLET | Freq: Two times a day (BID) | ORAL | Status: DC

## 2021-03-31 MED ORDER — QUETIAPINE FUMARATE 100 MG PO TABS: 100.0000 mg | ORAL_TABLET | Freq: Every day | ORAL | Status: DC

## 2021-03-31 MED ORDER — LITHIUM CARBONATE ER 450 MG PO TBCR
450.0000 mg | EXTENDED_RELEASE_TABLET | Freq: Two times a day (BID) | ORAL | Status: DC
  Administered 2021-03-31: 450 mg via ORAL
  Filled 2021-03-31: qty 1

## 2021-03-31 NOTE — ED Notes (Signed)
Pts belongings were transferred  to TCU with pt.

## 2021-03-31 NOTE — ED Notes (Signed)
Patient DC off unit to home per provider. Patient is alert, cooperative, no s/s of distress. DC information and belongings given to patient. Patient ambulatory off uint, escorted by RN. Patient transported by caregiver

## 2021-03-31 NOTE — Progress Notes (Addendum)
Spoke with patient's caregiver Daniel Holmes) via telecommunication in the ED following patient's TTS evaluation, who confirmed that patient's home psychotropic medication regimen consists of the following:  -Clonidine 0.1 mg p.o. twice daily (in the a.m. and at bedtime)  -Vistaril 25 mg p.o. twice daily  -Vistaril 25 mg p.o. daily as needed for agitation  -Lamictal 100 mg p.o. daily at bedtime  -Zyprexa 10 mg p.o. twice daily (in the a.m. and at bedtime)  -Seroquel 100 mg p.o. daily at bedtime  -Trazodone 100 mg p.o. daily at bedtime  -Lithium 450 mg p.o. twice daily  EKG ordered in order to check patient's QT/QTC prior to restarting patient's antipsychotic medications.  Patient's EKG shows QT/QTC of 414/420 ms.  Patient's QT/QTC is appropriate for continuation of patient's antipsychotic medications at this time.  Patient's EKG also reviewed with Dr. Stark Jock ([facility]) via phone, who confirmed that patient's EKG shows no acute/concerning findings.  Thus, we will restart patient's antipsychotic medications (Zyprexa and Seroquel) in the ED at this time.  Orders placed to restart patient's following home psychotropic medications:  -Clonidine 0.1 mg p.o. twice daily for ADHD  -Vistaril 25 mg p.o. twice daily for anxiety  -Vistaril 25 mg p.o. daily as needed for agitation  -Lamictal 100 mg p.o. daily at bedtime for mood stability  -Trazodone 100 mg p.o. daily at bedtime for insomnia/sleep  -Zyprexa 10 mg p.o. twice daily for manic-depression/bipolar disorder  -Seroquel 100 mg p.o. daily at bedtime for bipolar disorder/mood stability  Lithium level ordered to be drawn in the ED in order to check patient's lithium level prior to reinitiation of patient's home lithium medication in the ED. Will hold off on ordering patient's lithium at this time due to pending lithium level. Follow-up of patient's lithium level results as well as decision regarding ordering/restarting patient's home lithium  medication accordingly based on lithium level results will need to be done by dayshift psychiatry team.

## 2021-03-31 NOTE — ED Provider Notes (Signed)
Emergency Medicine Observation Re-evaluation Note  Daniel Holmes is a 51 y.o. male, seen on rounds today.  Pt initially presented to the ED for complaints of Suicidal Currently, the patient is resting comfortably.  Physical Exam  BP 114/68 (BP Location: Right Arm)   Pulse 75   Temp 97.8 F (36.6 C) (Oral)   Resp 18   Ht 5\' 3"  (1.6 m)   Wt 74.8 kg   SpO2 100%   BMI 29.23 kg/m  Physical Exam General: Nontoxic appearance Cardiac: Normal heart rate Lungs: Normal respiratory rate Psych: Cooperative  ED Course / MDM  EKG:EKG Interpretation  Date/Time:  Thursday March 31 2021 05:35:17 EDT Ventricular Rate:  62 PR Interval:  172 QRS Duration: 92 QT Interval:  414 QTC Calculation: 420 R Axis:   54 Text Interpretation: Normal sinus rhythm Nonspecific T wave abnormality Abnormal ECG Confirmed by Veryl Speak [phone]) on 03/31/2021 5:41:08 AM  I have reviewed the labs performed to date as well as medications administered while in observation.  Recent changes in the last 24 hours include seen and cleared by psychiatry.  Social work gave him some assistance with follow-up planning..  Plan  Current plan is for discharge today when his ride arrives.  Daniel Holmes is not under involuntary commitment.     Daleen Bo, MD 03/31/21 [phone]

## 2021-03-31 NOTE — ED Notes (Signed)
Clothing and shoes placed in locker 31. Wallet given to patient's guardian to take home.

## 2021-03-31 NOTE — BH Assessment (Addendum)
Comprehensive Clinical Assessment (CCA) Note  03/31/2021 AMED DATTA [id number]  Disposition: Daniel John, PA, recommends overnight observation for safety and stabilization with psych reassessment in the AM. Kirstin, RN, informed of disposition.  The patient demonstrates the following risk factors for suicide: Chronic risk factors for suicide include: psychiatric disorder of depression . Acute risk factors for suicide include: N/A. Protective factors for this patient include: positive social support, positive therapeutic relationship, responsibility to others (children, family), coping skills, and hope for the future. Considering these factors, the overall suicide risk at this point appears to be moderate. Patient is not appropriate for outpatient follow up.  [facility] ED from 03/30/2021 in [place] DEPT ED from 11/10/2020 in [place] DEPT  C-SSRS RISK CATEGORY Low Risk Low Risk      Daniel Holmes is a 50 year old male presenting voluntary to [facility] due to [place] with plan to step in front of car or to jump off roof. Patient denied HI, psychosis and alcohol/drug usage. Patient also has scratches on his left arm in an attempt to harm himself. Patient is accompanied by his caregiver Daniel Holmes, [phone]. Patient has been with caregiver for 1 year. Patient legal guardian is his mother. Patient gave consent for caregiver to remain in the room during assessment. Caregiver reported that patient did not get his way after damaging property and being told that he would have to pay $11.00 for damages. Caregiver reported patient then became suicidal and threatening with plans for attention, as he did not want to pay $11.00 for damages. Patient is currently seeing Daniel. Loni Holmes for medication management and Daniel [facility] for therapy. Patient reported his medications are working. Patient reported he wants to go to [facility] for inpatient psych treatment.  Patient unable to contract for safety. Patient was cooperative during assessment.   Chief Complaint:  Chief Complaint  Patient presents with   Suicidal   Visit Diagnosis:  Major depressive disorder  CCA Biopsychosocial Patient Reported Schizophrenia/Schizoaffective Diagnosis in Past: No data recorded  Strengths: self-awareness   Mental Health Symptoms Depression:   None   Duration of Depressive symptoms:  Duration of Depressive Symptoms: Less than two weeks   Mania:   None   Anxiety:    None; Worrying; Tension; Irritability; Restlessness   Psychosis:   None   Duration of Psychotic symptoms:    Trauma:   None   Obsessions:   None   Compulsions:   None   Inattention:   None   Hyperactivity/Impulsivity:   None   Oppositional/Defiant Behaviors:   None   Emotional Irregularity:   None   Other Mood/Personality Symptoms:  No data recorded   Mental Status Exam Appearance and self-care  Stature:   Average   Weight:   Average weight   Clothing:   Age-appropriate   Grooming:   Normal   Cosmetic use:   None   Posture/gait:   Normal   Motor activity:   Not Remarkable   Sensorium  Attention:   Normal   Concentration:   Normal   Orientation:   X5   Recall/memory:   Normal   Affect and Mood  Affect:   Appropriate; Anxious   Mood:   Anxious   Relating  Eye contact:   Normal   Facial expression:   Sad; Tense; Anxious   Attitude toward examiner:   Cooperative   Thought and Language  Speech flow:  Articulation error   Thought content:   Appropriate  to Mood and Circumstances   Preoccupation:   None   Hallucinations:   None   Organization:  No data recorded  [employer] of Knowledge:   Average   Intelligence:   Average   Abstraction:  No data recorded  Judgement:   Poor   Reality Testing:   Distorted   Insight:   Lacking   Decision Making:   Impulsive   Social Functioning  Social  Maturity:   Responsible   Social Judgement:  No data recorded  Stress  Stressors:   Transitions   Coping Ability:   Overwhelmed; Exhausted   Skill Deficits:   Self-control; Decision making; Responsibility   Supports:   Family; Friends/Service system    Religion: Religion/Spirituality Are You A Religious Person?:  Special educational needs teacher)  Leisure/Recreation: Leisure / Recreation Do You Have Hobbies?: Yes Leisure and Hobbies: fishing, football, Radiation protection practitioner and golf  Exercise/Diet: Exercise/Diet Do You Exercise?:  (uta) Have You Gained or Lost A Significant Amount of Weight in the Past Six Months?:  (uta) Do You Follow a Special Diet?:  (uta) Do You Have Any Trouble Sleeping?: No  CCA Employment/Education Employment/Work Situation: Employment / Work Technical sales engineer: On disability Why is Patient on Disability: mental health How Long has Patient Been on Disability: uta Has Patient ever Been in the [employer]?: No  Education: Education Is Patient Currently Attending School?: No Last Grade Completed:  [employer]) Did You Attend College?:  (uta)  CCA Family/Childhood History Family and Relationship History: Family history Marital status: Single Does patient have children?: No  Childhood History:  Childhood History By whom was/is the patient raised?: Both parents Did patient suffer any verbal/emotional/physical/sexual abuse as a child?: No Did patient suffer from severe childhood neglect?: No Has patient ever been sexually abused/assaulted/raped as an adolescent or adult?: No Was the patient ever a victim of a crime or a disaster?: No  Child/Adolescent Assessment:   CCA Substance Use Alcohol/Drug Use: Alcohol / Drug Use Pain Medications: see MAR Prescriptions: see MAR Over the Counter: see MAR History of alcohol / drug use?: No history of alcohol / drug abuse   ASAM's:  Six Dimensions of Multidimensional Assessment  Dimension 1:  Acute Intoxication and/or  Withdrawal Potential:      Dimension 2:  Biomedical Conditions and Complications:      Dimension 3:  Emotional, Behavioral, or Cognitive Conditions and Complications:     Dimension 4:  Readiness to Change:     Dimension 5:  Relapse, Continued use, or Continued Problem Potential:     Dimension 6:  Recovery/Living Environment:     ASAM Severity Score:    ASAM Recommended Level of Treatment:     Substance use Disorder (SUD)   Recommendations for Services/Supports/Treatments: Recommendations for Services/Supports/Treatments Recommendations For Services/Supports/Treatments: Individual Therapy, Medication Management  Discharge Disposition:   DSM5 Diagnoses: Patient Active Problem List   Diagnosis Date Noted   Weight gain 03/10/2020   High risk medication use 11/14/2017   Upper airway cough syndrome 03/01/2017   Bilateral knee pain 02/08/2017   Encounter for psychological evaluation 09/29/2015   Bipolar 1 disorder ([place]) 11/08/2014   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

## 2021-03-31 NOTE — Consult Note (Signed)
  Patient seen and reassessed by this nurse practitioner. He appears to be stable to discharge and return to his group home.   On evaluation patient is alert and oriented, calm and cooperative, very pleasant upon approach.  Patient denies any access to weapons, denies any alcohol and or substance abuse.  He reports moderate sleep and fair appetite.  He is [facility], Dr. Darleene Cleaver , and his ALF is available to assist with medication management in which he reports compliance with most of his appointments.  Patient denies any auditory and/or visual hallucinations, does not appear to be responding to internal or external stimuli.  There is no evidence of delusional thought content and patient appears to answer all questions appropriately.  At this time patient appears to be stable to discharge home, with support system services in place.  He is able to contract for safety.

## 2021-03-31 NOTE — BH Assessment (Addendum)
[place] Assessment Progress Note   Per Sheran Fava, NP, this voluntary pt does not require psychiatric hospitalization at this time.  Pt is psychiatrically cleared.  Discharge instructions advise pt to continue treatment with Corena Pilgrim, MD.  At 11:30 this writer called pt's caregiver, [name] at [phone], informing her of disposition.  She reports that she will be at [facility] in about 20 minutes to pick him up.  Upon questioning, she reports that pt's mother, Shain Pauwels ([phone]), is his legal guardian, and she agrees to bring letter of guardianship with her.  I will call her after letter is received.  EDP Daleen Bo, MD and pt's nurse, Eustaquio Maize, have been notified.  Jalene Mullet, MA Triage Specialist [phone]   Addendum:  At 15:30 this Probation officer received letter of guardianship, then called pt's step-mother/guardian, notifying her of disposition.  Jalene Mullet, [place] Coordinator [phone]

## 2021-03-31 NOTE — Discharge Instructions (Signed)
For your behavioral health needs you are advised to continue treatment with Corena Pilgrim, MD:       [facility]      [address], [place], [place] [address]      [phone]

## 2021-04-01 ENCOUNTER — Other Ambulatory Visit: Payer: Self-pay | Admitting: *Deleted

## 2021-04-04 MED ORDER — LEVOTHYROXINE SODIUM 75 MCG PO TABS: 75.0000 ug | ORAL_TABLET | Freq: Every day | ORAL | 0 refills | Status: AC

## 2021-04-19 ENCOUNTER — Other Ambulatory Visit: Payer: Self-pay

## 2021-04-19 ENCOUNTER — Encounter: Payer: Self-pay | Admitting: Family Medicine

## 2021-04-19 ENCOUNTER — Ambulatory Visit (INDEPENDENT_AMBULATORY_CARE_PROVIDER_SITE_OTHER): Payer: Medicaid Other | Admitting: Family Medicine

## 2021-04-19 VITALS — BP 98/62 | HR 72 | Wt 164.8 lb

## 2021-04-19 DIAGNOSIS — E038 Other specified hypothyroidism: Secondary | ICD-10-CM

## 2021-04-19 DIAGNOSIS — R635 Abnormal weight gain: Secondary | ICD-10-CM | POA: Diagnosis not present

## 2021-04-19 DIAGNOSIS — Z23 Encounter for immunization: Secondary | ICD-10-CM

## 2021-04-19 NOTE — Assessment & Plan Note (Signed)
Weight is stable.  Reportedly eating well

## 2021-04-19 NOTE — Progress Notes (Signed)
    SUBJECTIVE:   CHIEF COMPLAINT / HPI:   Here for check up.  Overall feels well.  Accompanied by his group home supervisor  Knees - has intermittent pain.  No swelling.  Takes tylenol sometimes   Cough - intermittently bothers him.  No shortness of breath or edema  PERTINENT  PMH / PSH: Had cmet and cbc on 03/30/21  OBJECTIVE:   BP 98/62   Pulse 72   Wt 164 lb 12.8 oz (74.8 kg)   SpO2 98%   BMI 29.19 kg/m   Heart - Regular rate and rhythm.  No murmurs, gallops or rubs.    Lungs:  Normal respiratory effort, chest expands symmetrically. Lungs are clear to auscultation, no crackles or wheezes. Abdomen: soft and non-tender without masses, organomegaly or hernias noted.  No guarding or rebound Extremities:  No cyanosis, edema, or deformity noted with good range of motion of all major joints.    Mild crepitus in both knees  ASSESSMENT/PLAN:   Hypothyroidism Weight is stable.  Check TSH   Weight gain Weight is stable.  Reportedly eating well  Knee Pain - suggest tylenol and knee sleeves   Annual Examination Male over 34 yo  I reviewed the following patient responses on our Physical Exam Form Tobacco use and candidacy for lung cancer or aneurysm screening Alcohol Use  Weight  Exercise  Risk for STI  Fall risk  PHQ9 score reviewed  Blood pressure reviewed  I considered the following items based upon USPSTF recommendations: Colon cancer screening HIV testing:  Hepatitis C testing Cholesterol screening STI screening if high risk (Hepatitis B, Syphilis, Gonorrhea, Chlamydia) Immunizations - Influenza, Covid, [place], Pneumonia, Tetanus  See After Visit Summary for recommendations   Lind Covert, MD [place]

## 2021-04-19 NOTE — Assessment & Plan Note (Signed)
Weight is stable.  Check TSH

## 2021-04-19 NOTE — Patient Instructions (Signed)
Good to see you today - Thank you for coming in  Things we discussed today:  Knee sleeves for knee pain  Cough drops for dry throat  I will call you if your tests are not good.  Otherwise, I will send you a message on MyChart (if it is active) or a letter in the mail..  If you do not hear from me with in 2 weeks please call our office.      Come back to see me in 12 months

## 2021-04-20 ENCOUNTER — Encounter: Payer: Self-pay | Admitting: Family Medicine

## 2021-04-20 LAB — TSH: TSH: 1.23 u[IU]/mL (ref 0.450–4.500)

## 2021-05-31 ENCOUNTER — Other Ambulatory Visit: Payer: Self-pay

## 2021-06-01 MED ORDER — SENTRY SENIOR PO TABS: 1.0000 | ORAL_TABLET | Freq: Every day | ORAL | 3 refills | Status: DC

## 2021-07-04 ENCOUNTER — Other Ambulatory Visit: Payer: Self-pay | Admitting: Family Medicine

## 2021-07-29 ENCOUNTER — Other Ambulatory Visit: Payer: Self-pay

## 2021-07-29 ENCOUNTER — Emergency Department (HOSPITAL_COMMUNITY)
Admission: EM | Admit: 2021-07-29 | Discharge: 2021-07-30 | Disposition: A | Discharge status: Home / Self Care | Payer: Medicaid Other | Attending: Emergency Medicine | Admitting: Emergency Medicine

## 2021-07-29 ENCOUNTER — Encounter (HOSPITAL_COMMUNITY): Payer: Self-pay | Admitting: *Deleted

## 2021-07-29 DIAGNOSIS — Z20822 Contact with and (suspected) exposure to covid-19: Secondary | ICD-10-CM | POA: Diagnosis not present

## 2021-07-29 DIAGNOSIS — Z1339 Encounter for screening examination for other mental health and behavioral disorders: Secondary | ICD-10-CM | POA: Diagnosis not present

## 2021-07-29 DIAGNOSIS — F4325 Adjustment disorder with mixed disturbance of emotions and conduct: Secondary | ICD-10-CM | POA: Diagnosis not present

## 2021-07-29 DIAGNOSIS — F314 Bipolar disorder, current episode depressed, severe, without psychotic features: Secondary | ICD-10-CM | POA: Insufficient documentation

## 2021-07-29 DIAGNOSIS — R45851 Suicidal ideations: Secondary | ICD-10-CM | POA: Diagnosis not present

## 2021-07-29 DIAGNOSIS — R4689 Other symptoms and signs involving appearance and behavior: Secondary | ICD-10-CM

## 2021-07-29 DIAGNOSIS — F430 Acute stress reaction: Secondary | ICD-10-CM

## 2021-07-29 LAB — COMPREHENSIVE METABOLIC PANEL
ALT: 21 U/L (ref 0–44)
AST: 23 U/L (ref 15–41)
Albumin: 4.5 g/dL (ref 3.5–5.0)
Alkaline Phosphatase: 68 U/L (ref 38–126)
Anion gap: 7 (ref 5–15)
BUN: 7 mg/dL (ref 6–20)
CO2: 23 mmol/L (ref 22–32)
Calcium: 9.8 mg/dL (ref 8.9–10.3)
Chloride: 111 mmol/L (ref 98–111)
Creatinine, Ser: 1.02 mg/dL (ref 0.61–1.24)
GFR, Estimated: >60 mL/min (ref >60)
Glucose, Bld: 105 mg/dL — ABNORMAL HIGH (ref 70–99)
Potassium: 4.2 mmol/L (ref 3.5–5.1)
Sodium: 141 mmol/L (ref 135–145)
Total Bilirubin: 0.4 mg/dL (ref 0.3–1.2)
Total Protein: 6.8 g/dL (ref 6.5–8.1)

## 2021-07-29 LAB — CBC
HCT: 38.4 % — ABNORMAL LOW (ref 39.0–52.0)
Hemoglobin: 12.8 g/dL — ABNORMAL LOW (ref 13.0–17.0)
MCH: 31.3 pg (ref 26.0–34.0)
MCHC: 33.3 g/dL (ref 30.0–36.0)
MCV: 93.9 fL (ref 80.0–100.0)
Platelets: 201 10*3/uL (ref 150–400)
RBC: 4.09 MIL/uL — ABNORMAL LOW (ref 4.22–5.81)
RDW: 12.8 % (ref 11.5–15.5)
WBC: 6.6 10*3/uL (ref 4.0–10.5)
nRBC: 0 % (ref 0.0–0.2)

## 2021-07-29 LAB — RAPID URINE DRUG SCREEN, HOSP PERFORMED
Amphetamines: NOT DETECTED
Barbiturates: NOT DETECTED
Benzodiazepines: NOT DETECTED
Cocaine: NOT DETECTED
Opiates: NOT DETECTED
Tetrahydrocannabinol: NOT DETECTED

## 2021-07-29 LAB — SALICYLATE LEVEL: Salicylate Lvl: <7 mg/dL — ABNORMAL LOW (ref 7.0–30.0)

## 2021-07-29 LAB — ETHANOL: Alcohol, Ethyl (B): <10 mg/dL (ref <10)

## 2021-07-29 LAB — ACETAMINOPHEN LEVEL: Acetaminophen (Tylenol), Serum: <10 ug/mL — ABNORMAL LOW (ref 10–30)

## 2021-07-29 MED ORDER — LAMOTRIGINE 100 MG PO TABS
100.0000 mg | ORAL_TABLET | Freq: Every day | ORAL | Status: DC
  Administered 2021-07-30: 100 mg via ORAL
  Filled 2021-07-29 (×2): qty 1

## 2021-07-29 MED ORDER — OLANZAPINE 5 MG PO TABS
10.0000 mg | ORAL_TABLET | Freq: Two times a day (BID) | ORAL | Status: DC
  Administered 2021-07-30: 10 mg via ORAL
  Filled 2021-07-29 (×2): qty 1

## 2021-07-29 MED ORDER — LITHIUM CARBONATE ER 450 MG PO TBCR
450.0000 mg | EXTENDED_RELEASE_TABLET | Freq: Two times a day (BID) | ORAL | Status: DC
  Administered 2021-07-30 (×2): 450 mg via ORAL
  Filled 2021-07-29 (×3): qty 1

## 2021-07-29 MED ORDER — LORATADINE 10 MG PO TABS
10.0000 mg | ORAL_TABLET | Freq: Every day | ORAL | Status: DC
  Administered 2021-07-30: 10 mg via ORAL
  Filled 2021-07-29: qty 1

## 2021-07-29 MED ORDER — LEVOTHYROXINE SODIUM 75 MCG PO TABS
75.0000 ug | ORAL_TABLET | Freq: Every day | ORAL | Status: DC
  Administered 2021-07-30: 75 ug via ORAL
  Filled 2021-07-29: qty 1

## 2021-07-29 MED ORDER — TRAZODONE HCL 100 MG PO TABS
100.0000 mg | ORAL_TABLET | Freq: Every day | ORAL | Status: DC
  Administered 2021-07-30: 100 mg via ORAL
  Filled 2021-07-29 (×2): qty 2

## 2021-07-29 MED ORDER — QUETIAPINE FUMARATE 100 MG PO TABS
100.0000 mg | ORAL_TABLET | Freq: Every day | ORAL | Status: DC
  Administered 2021-07-30: 100 mg via ORAL
  Filled 2021-07-29 (×2): qty 1

## 2021-07-29 MED ORDER — CLONIDINE HCL 0.2 MG PO TABS
0.1000 mg | ORAL_TABLET | Freq: Two times a day (BID) | ORAL | Status: DC
Start: 2021-07-29 — End: 2021-07-30
  Administered 2021-07-29 – 2021-07-30 (×2): 0.1 mg via ORAL
  Filled 2021-07-29 (×2): qty 1

## 2021-07-29 NOTE — ED Triage Notes (Signed)
The pt arrived from [facility] by gems  they reported that the pt is si  however he denies either and reports that he is here for anger mannagement  just today  he has a speech problem that makes him impossible to uinderstand all the time  ?

## 2021-07-29 NOTE — ED Notes (Addendum)
Patient's belongings placed into locker #2. ?

## 2021-07-29 NOTE — BH Assessment (Signed)
Per RN, Pt is still in triage and unable to participate in tele-assessment at this time. ? ? ?Daniel [facility], [facility], [place] ?Triage Specialist ?[phone] ? ?

## 2021-07-29 NOTE — ED Provider Triage Note (Signed)
Emergency Medicine Provider Triage Evaluation Note ? ?[name] , a 51 y.o. male  was evaluated in triage.  Pt complains of SI that started today. Says he was kicked out of where he lives. States no plan, just knows he will do it. Denies HI, AVH. Denies drug or alcohol use. Requesting to speak w social work ? ?Review of Systems  ?See above ? ?Physical Exam  ?BP 118/79 (BP Location: Left Arm)   Pulse 86   Temp (!) 100.5 ?F (38.1 ?C) (Oral)   Resp 18   SpO2 99%  ?Gen:   Awake, no distress   ?Resp:  Normal effort  ?MSK:   Moves extremities without difficulty  ?Other:   ? ?Medical Decision Making  ?Medically screening exam initiated at 5:09 PM.  Appropriate orders placed.  REILEY KEISLER was informed that the remainder of the evaluation will be completed by another provider, this initial triage assessment does not replace that evaluation, and the importance of remaining in the ED until their evaluation is complete. ? ? ?Very difficult to understand. PMG developmental disorder ?  ?Rhae Hammock, PA-C ?07/29/21 1711 ? ?

## 2021-07-29 NOTE — ED Notes (Signed)
The pt is quieter than he was initially ?

## 2021-07-29 NOTE — ED Provider Notes (Signed)
Patient screaming out that he is going to kill himself. ?  ?Rhae Hammock, PA-C ?07/29/21 1736 ? ?  ?Lucrezia Starch, MD ?08/01/21 0740 ? ?

## 2021-07-29 NOTE — ED Notes (Signed)
Patient dressed in Medium purple scrubs.  ?

## 2021-07-29 NOTE — ED Provider Notes (Signed)
? ?[facility]  ?Provider Note ? ?CSN: [id number] ?Arrival date & time: 07/29/21 1628 ? ?History ?Chief Complaint  ?Patient presents with  ? Psychiatric Evaluation  ? ?HPI and ROS are limited by patient's baseline cognitive delays. ? ?Daniel Holmes is a 51 y.o. male presents today with suicidal thoughts and ideation.  He states he does not have a plan.  He states that someone told him to do it.  Patient is very difficult to understand. ? ?I spoke to 1 of patient's caregivers named Jan.  She reported that he tried jumping out of a car today.  He reported that he was going to kill himself because he does not want to live in the place that he is currently living.  He left the meeting that he was at abruptly and police had to be called.  Police brought patient here for further evaluation. ? ? ?Home Medications ?Prior to Admission medications   ?Medication Sig Start Date End Date Taking? Authorizing Provider  ?cetirizine (ZYRTEC) 10 MG tablet TAKE 1 TABLET BY MOUTH DAILY. ?Patient taking differently: Take 10 mg by mouth in the morning. 02/28/21   Lind Covert, MD  ?cloNIDine (CATAPRES) 0.1 MG tablet Take 0.1 mg by mouth in the morning and at bedtime.    [provider]  ?hydrOXYzine (ATARAX/VISTARIL) 25 MG tablet Take 1 tablet (25 mg total) by mouth every 8 (eight) hours as needed. ?Patient taking differently: Take 25 mg by mouth in the morning and at bedtime. 11/10/20   [name], MD  ?hydrOXYzine (ATARAX/VISTARIL) 50 MG tablet Take 25 mg by mouth daily as needed (for agitation).    [provider]  ?lamoTRIgine (LAMICTAL) 100 MG tablet Take 100 mg by mouth at bedtime.    [provider]  ?levocetirizine (XYZAL) 5 MG tablet TAKE 1 TABLET BY MOUTH AT BEDTIME. 04/01/21   Lind Covert, MD  ?levothyroxine (SYNTHROID) 75 MCG tablet Take 1 tablet (75 mcg total) by mouth daily before breakfast. 04/04/21   Lind Covert, MD  ?lithium  carbonate (ESKALITH) 450 MG CR tablet Take 1 tablet (450 mg total) by mouth every 12 (twelve) hours. ?Patient taking differently: Take 450 mg by mouth in the morning and at bedtime. 11/10/14   Pucilowska, Wardell Honour, MD  ?Multiple Vitamins-Minerals (SENTRY SENIOR) TABS Take 1 tablet by mouth daily. 07/05/21   Lind Covert, MD  ?OLANZapine (ZYPREXA) 10 MG tablet Take 10 mg by mouth in the morning and at bedtime.    [provider]  ?OLANZapine (ZYPREXA) 20 MG tablet Take 1.5 tablets (30 mg total) by mouth at bedtime. Take 1 and 1/2 tablets by mouth at bedtime. 11/10/14   Pucilowska, Herma Ard B, MD  ?QUEtiapine (SEROQUEL) 100 MG tablet Take 100 mg by mouth at bedtime.    [provider]  ?sodium chloride (SALINE MIST) 0.65 % nasal spray USE 2 SPRAYS IN EACH NOSTRIL 4 TIMES DAILY. ?Patient taking differently: Place 2 sprays into the nose 4 (four) times daily. 11/09/20   Lind Covert, MD  ?traZODone (DESYREL) 100 MG tablet Take 100 mg by mouth at bedtime.    [provider]  ? ? ? ?Allergies    ?Bee pollen, Bee venom, and Penicillins ? ? ?Review of Systems   ?Review of Systems  ?Unable to perform ROS: Other  ?Please see HPI for pertinent positives and negatives ? ?Physical Exam ?BP 118/79 (BP Location: Left Arm)   Pulse 86   Temp Marland Kitchen)  100.5 ?F (38.1 ?C) (Oral)   Resp 18   Ht 5\' 3"  (1.6 m)   Wt 74.8 kg   SpO2 99%   BMI 29.21 kg/m?  ? ?Physical Exam ?Vitals and nursing note reviewed.  ?Constitutional:   ?   General: He is not in acute distress. ?   Appearance: He is well-developed.  ?HENT:  ?   Head: Normocephalic and atraumatic.  ?Eyes:  ?   Conjunctiva/sclera: Conjunctivae normal.  ?Cardiovascular:  ?   Rate and Rhythm: Normal rate and regular rhythm.  ?   Heart sounds: No murmur heard. ?Pulmonary:  ?   Effort: Pulmonary effort is normal. No respiratory distress.  ?   Breath sounds: Normal breath sounds.  ?Abdominal:  ?   Palpations: Abdomen is soft.  ?   Tenderness: There is  no abdominal tenderness.  ?Musculoskeletal:     ?   General: No swelling.  ?   Cervical back: Neck supple.  ?Skin: ?   General: Skin is warm and dry.  ?   Capillary Refill: Capillary refill takes less than 2 seconds.  ?Neurological:  ?   Mental Status: He is alert.  ?Psychiatric:     ?   Mood and Affect: Mood normal.     ?   Thought Content: Thought content includes suicidal ideation. Thought content does not include homicidal ideation. Thought content does not include homicidal or suicidal plan.  ? ? ?ED Results / Procedures / Treatments   ?EKG ?None ? ?Procedures ?Procedures ? ?Medications Ordered in the ED ?Medications  ?cloNIDine (CATAPRES) tablet 0.1 mg (has no administration in time range)  ?lamoTRIgine (LAMICTAL) tablet 100 mg (has no administration in time range)  ?levocetirizine (XYZAL) tablet 5 mg (has no administration in time range)  ?levothyroxine (SYNTHROID) tablet 75 mcg (has no administration in time range)  ?lithium carbonate (ESKALITH) CR tablet 450 mg (has no administration in time range)  ?OLANZapine (ZYPREXA) tablet 10 mg (has no administration in time range)  ?QUEtiapine (SEROQUEL) tablet 100 mg (has no administration in time range)  ?traZODone (DESYREL) tablet 100 mg (has no administration in time range)  ? ? ? ?ED Course  ? ?  ? ? ?MDM  ? ?This patient presents to the ED for concern of suicidal ideation, this involves an extensive number of treatment options, and is a complaint that carries with it a high risk of complications and morbidity.  ? ?Additional history obtained: ?Additional history obtained from family and nursing home/care facility ?Records reviewed previous admission documents and Primary Care Documents ? ?Lab Tests: ?I Ordered, and personally interpreted labs.  The pertinent results include: Labs are grossly unremarkable. ? ?Medical Decision Making: As noted with reported suicidal ideation.  He stated he had no plan.  However, per caregiver patient tried to jump out of a moving  vehicle today and attempt to kill himself.  It appears that patient does not want to live in the place that he is currently living which seems to be the trigger for his current thoughts and behaviors.  Patient will need to be evaluated by psychiatry.  Consult was placed.  Patient will stay here in the emergency department until disposition can be determined. ? ?Complexity of problems addressed: ?Patient?s presentation is most consistent with  acute presentation with potential threat to life or bodily function ? ? ?Patient seen in conjunction with my attending, Dr. Maryan Rued. ? ? ? ?Final Clinical Impression(s) / ED Diagnoses ?Final diagnoses:  ?Suicidal ideation  ? ? ?Rx /  DC Orders ?ED Discharge Orders   ? ? None  ? ?  ? ? ?  ?Jacelyn Pi, MD ?07/29/21 2315 ? ?  ?Blanchie Dessert, MD ?07/30/21 0024 ? ?

## 2021-07-30 LAB — URINALYSIS, ROUTINE W REFLEX MICROSCOPIC
Bilirubin Urine: NEGATIVE
Glucose, UA: NEGATIVE mg/dL
Hgb urine dipstick: NEGATIVE
Ketones, ur: NEGATIVE mg/dL
Leukocytes,Ua: NEGATIVE
Nitrite: NEGATIVE
Protein, ur: NEGATIVE mg/dL
Specific Gravity, Urine: 1.005 (ref 1.005–1.030)
pH: 7 (ref 5.0–8.0)

## 2021-07-30 LAB — RESP PANEL BY RT-PCR (FLU A&B, COVID) ARPGX2
Influenza A by PCR: NEGATIVE
Influenza B by PCR: NEGATIVE
SARS Coronavirus 2 by RT PCR: NEGATIVE

## 2021-07-30 MED ORDER — GUAIFENESIN 100 MG/5ML PO LIQD
5.0000 mL | Freq: Once | ORAL | Status: AC
Start: 2021-07-30 — End: 2021-07-30
  Administered 2021-07-30: 5 mL via ORAL
  Filled 2021-07-30: qty 5

## 2021-07-30 MED ORDER — IBUPROFEN 200 MG PO TABS
200.0000 mg | ORAL_TABLET | Freq: Once | ORAL | Status: AC
  Administered 2021-07-30: 200 mg via ORAL
  Filled 2021-07-30: qty 1

## 2021-07-30 MED ORDER — ACETAMINOPHEN 325 MG PO TABS
650.0000 mg | ORAL_TABLET | ORAL | Status: DC | PRN
Start: 2021-07-30 — End: 2021-07-30
  Administered 2021-07-30: 650 mg via ORAL
  Filled 2021-07-30: qty 2

## 2021-07-30 NOTE — ED Provider Notes (Signed)
Patient is awaiting psychiatric evaluation.  Apparently he has a fever and cough.  He is not hypoxic.  COVID and flu screen is negative. ?We will add on urinalysis.  He is requesting antitussive medication ?  ?Ripley Fraise, MD ?07/30/21 0211 ? ?

## 2021-07-30 NOTE — ED Notes (Signed)
Caretaker Shelly called for updated time of arrival. Per Darrick Penna, should be here in about an hour to pick up patient.  ?

## 2021-07-30 NOTE — ED Notes (Signed)
Dayton Martes, caretaker, called and updated on plans for discharge. Per Darrick Penna, she is out of town and will be able to pick pt up around 1500 today. She will call with updates on time.  ?

## 2021-07-30 NOTE — ED Provider Notes (Signed)
Emergency Medicine Observation Re-evaluation Note ? ?Daniel Holmes is a 51 y.o. male, seen on rounds today.  Pt initially presented to the ED for complaints of behavioral symptoms.  Pt currently requests something for chronic bilateral knee pain. Denies recent fall or injury. No current thoughts of harm to self or others.  ? ?Physical Exam  ?BP 122/68   Pulse (!) 59   Temp 98.6 ?F (37 ?C) (Oral)   Resp 12   Ht 1.6 m ('5\' 3"'$ )   Wt 74.8 kg   SpO2 98%   BMI 29.21 kg/m?  ?Physical Exam ?General: alert, content.  ?Cardiac: regular rate. ?Lungs: breathing comfortably. ?Psych: pt with normal mood and affect. Pt does not appear acutely depressed or despondent.  Pt does not appear to be responding to internal stimuli. No delusions or hallucinations are noted. Pt does not express thoughts of harm to self or others. No SI.  ? ?ED Course / MDM  ? ? ?I have reviewed the labs performed to date as well as medications administered while in observation.  Recent changes in the last 24 hours include ED observation, reassessment.  ? ?Plan  ? ? [facility] is not under involuntary commitment. ? ?Patient with hx ID/DD, and history recurrent behavioral symptoms, at times when he does not get his way.  Patient currently is not exhibiting any acute psychosis. He does not appear acutely depressed. Indicates his only concern at this moment is chronic knee pain and occasionally low back pain. Denies recent injury.  Bil knees, good rom without pain, legs of normal color  and warmth, distal pulses palp, no edema. Ibuprofen po. Spine non tender, aligned.  ? ?Pt currently appears at baseline, and stable for d/c.  ? ?Rec outpatient f/u with his pcp and bh provider.  ? ?Rn/staff to contact pts group home to inform of d/c/return there.  ? ? ? ? ?  ?[name], MD ?07/30/21 1114 ? ?

## 2021-07-30 NOTE — Discharge Instructions (Addendum)
It was our pleasure to provide your ER care today - we hope that you feel better. ? ?Follow up closely with your primary care doctor and behavioral health provider in the next 1-2 weeks. ? ?You may take acetaminophen as need for back or knee pain.  ? ?Make sure to cooperate with your home/group home staff, and to treat them with kindness and respect.  Also be respectful of group home rules and policies.  Also see attached mindfulness and stress reduction methods.  ? ?Return to ER if worse, new symptoms, fevers, trouble breathing, or other concern.   ?
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
## 2021-07-30 NOTE — ED Notes (Signed)
Pt provided with belongings to change into clothes in preparation for discharge.  ?

## 2021-07-30 NOTE — ED Notes (Signed)
Patient at nurse desk talking to step mom on phone first phone call ?

## 2021-07-30 NOTE — ED Notes (Signed)
Lunch tray delivered to pt at this time.

## 2021-07-30 NOTE — ED Notes (Signed)
Getting ttsd ?

## 2021-08-01 ENCOUNTER — Ambulatory Visit (HOSPITAL_COMMUNITY)
Admission: EM | Admit: 2021-08-01 | Discharge: 2021-08-01 | Disposition: A | Discharge status: Home / Self Care | Payer: Medicaid Other | Attending: Psychiatry | Admitting: Psychiatry

## 2021-08-01 DIAGNOSIS — Z9152 Personal history of nonsuicidal self-harm: Secondary | ICD-10-CM | POA: Insufficient documentation

## 2021-08-01 DIAGNOSIS — F919 Conduct disorder, unspecified: Secondary | ICD-10-CM | POA: Insufficient documentation

## 2021-08-01 NOTE — ED Provider Notes (Signed)
Behavioral Health Urgent Care Medical Screening Exam ? ?Patient Name: Daniel Holmes ?MRN: [id number] ?Date of Evaluation: 08/01/21 ?Chief Complaint:   ?Diagnosis:  ?Final diagnoses:  ?Behavior disturbance  ? ? ?History of Present illness: Daniel Holmes is a 51 y.o. male patient presented to [facility] as a walk in accompanied by [employer] with complaints of "Daniel Holmes said I needed to come here to get help".  ? ?[name], 51 y.o., male patient seen face to face by this provider, consulted with Dr. Serafina Mitchell; and chart reviewed on 08/01/21. ? ?Per chart review patient has a past psychiatric history of Bipolar 1 Disorder, IDD, and previous SI attempts by running into traffic. He was seen by psychiatry on 07/30/2021 at [facility] [place] states he gets mad at his ALF and this morning he was going to run away. States, "I keep running away and Daniel Holmes  knows she cant stop me". He asked if he could stay here, educated patient he can not live here at the [facility]. He then request to be sent back to his ALF.  ? ?During evaluation Daniel Holmes is in sitting position in no acute distress.  He is pleasant and redirectable. He  is alert/oriented x 3 and calm/cooperative.  His mood is euthymic.  His speech is garbled and difficult to understand. He makes fair eye contact. He denies depression. There is no indication that he is currently responding to internal/external stimuli. He denies AVH. He denies SI/HI. He contacts for safety. He denies access to firearms/weapons. He denies any substance use.  ? ?He is instructed to call 911 and present to the nearest emergency room should he experience any suicidal/homicidal ideation, auditory/visual/hallucinations, or detrimental worsening of his mental health condition. ? ?Collateral: Daniel Holmes [phone] owner of ALF. States "Dermot knows the system, he knows we cant stop him from leaving" . States this morning they were trying to leave and Daniel Holmes became mad and poked himself in the  eye. He then stated that he was going to kill himself, which he has done often when he gets mad. The police were called and brought him to [facility] for assessment. She has no safety concerns with patient returning to the ALF.  States he is welcome to return but she can not pick him up at this time. She requested that the police bring him back home.  ? ?Daniel Holmes  [phone] Legal guardian contacted. She has no immediate safety concerns with patient returning back to the ALF.  ?  ? ?Psychiatric Specialty Exam ? ?Presentation  ?General Appearance:Casual ? ?Eye Contact:Fair ? ?Speech:Clear and Coherent; Garbled ? ?Speech Volume:Normal ? ?Handedness:Right ? ? ?Mood and Affect  ?Mood:Euthymic ? ?Affect:Congruent ? ? ?Thought Process  ?Thought Processes:Coherent ? ?Descriptions of Associations:Intact ? ?Orientation:Full (Time, Place and Person) ? ?Thought Content:Logical ? Diagnosis of Schizophrenia or Schizoaffective disorder in past: No ?  Hallucinations:None ? ?Ideas of Reference:None ? ?Suicidal Thoughts:No ? ?Homicidal Thoughts:No ? ? ?Sensorium  ?Memory:Immediate Good; Recent Good; Remote Good ? ?Judgment:Fair ? ?Insight:Fair ? ? ?Executive Functions  ?Concentration:Poor ? ?Attention Span:Fair ? ?Recall:Fair ? ?[place] ? ?Language:Poor ? ? ?Psychomotor Activity  ?Psychomotor Activity:Normal ? ? ?Assets  ?Assets:Desire for Improvement; Financial Resources/Insurance; Housing; Leisure Time; Physical Health; Resilience; Social Support ? ? ?Sleep  ?Sleep:Fair ? ?Number of hours: No data recorded ? ?No data recorded ? ?Physical Exam: ?Physical Exam ?Vitals and nursing note reviewed.  ?Constitutional:   ?   General: He is not in acute  distress. ?   Appearance: He is well-developed.  ?HENT:  ?   Head: Normocephalic.  ?Eyes:  ?   General:     ?   Right eye: No discharge.     ?   Left eye: No discharge.  ?   Conjunctiva/sclera: Conjunctivae normal.  ?Cardiovascular:  ?   Rate and Rhythm: Normal rate.   ?Pulmonary:  ?   Effort: Pulmonary effort is normal. No respiratory distress.  ?Musculoskeletal:     ?   General: No swelling. Normal range of motion.  ?   Cervical back: Normal range of motion.  ?Skin: ?   Coloration: Skin is not jaundiced or pale.  ?Neurological:  ?   Mental Status: He is alert and oriented to person, place, and time.  ?Psychiatric:     ?   Attention and Perception: Attention and perception normal.     ?   Mood and Affect: Mood normal.     ?   Speech: Speech normal.     ?   Behavior: Behavior normal.     ?   Thought Content: Thought content normal.     ?   Cognition and Memory: Cognition normal.     ?   Judgment: Judgment normal.  ? ?Review of Systems  ?Constitutional: Negative.   ?HENT: Negative.    ?Eyes: Negative.   ?Respiratory: Negative.    ?Cardiovascular: Negative.   ?Musculoskeletal: Negative.   ?Skin: Negative.   ?Neurological: Negative.   ?Psychiatric/Behavioral: Negative.    ?Blood pressure 109/62, pulse 91, temperature 98.2 ?F (36.8 ?C), temperature source Oral, resp. rate 20, SpO2 93 %. There is no height or weight on file to calculate BMI. ? ?Musculoskeletal: ?Strength & Muscle Tone: within normal limits ?Gait & Station: normal ?Patient leans: N/A ? ? ?[facility] MSE Discharge Disposition for Follow up and Recommendations: ?Based on my evaluation the patient does not appear to have an emergency medical condition and can be discharged with resources and follow up care in outpatient services for Medication Management and Individual Therapy ? ?Patient states he gets therapy once per week. He could not remember name of therapist or psychiatric provider.  ? ?Per chart review patient sees Dr. Darleene Cleaver for medication management.  ? ?No evidence of imminent risk to self or others at present.    ?Patient does not meet criteria for psychiatric inpatient admission. ?Discussed crisis plan, support from social network, calling 911, coming to the Emergency Department, and calling Suicide Hotline.   ? ? ?Revonda Humphrey, NP ?08/01/2021, 8:43 AM ? ?

## 2021-08-01 NOTE — ED Triage Notes (Signed)
Pt presents to [facility] escorted by [employer]. Per chart review pt has reportedly been diagnosed with Bipolar and IDD.Pt states that he was discharged from the hospital on Saturday 3/4. Pt states that his guardian Adela Lank would like for him to have a mental health evaluation due to some behaviors at home. Pt cannot elaborate on what those behaviors are, pt states " You have to call Shelia, I don't know". Pt has difficulty speaking. Pts guardian per [employer] is Lorayne Bender and she can be reached at [phone]. Pt denies SI/HI and AVH at this time. ?

## 2021-08-01 NOTE — Discharge Instructions (Signed)
Patient is instructed prior to discharge to: Take all medications as prescribed by his/her mental healthcare provider. ?Report any adverse effects and or reactions from the medicines to his/her outpatient provider promptly. ?Patient has been instructed & cautioned: To not engage in alcohol and or illegal drug use while on prescription medicines. ?In the event of worsening symptoms, patient is instructed to call the crisis hotline, 911 and or go to the nearest ED for appropriate evaluation and treatment of symptoms. ?To follow-up with his/her primary care provider for your other medical issues, concerns and or health care needs. ? ?The suicide prevention education provided includes the following: ?Suicide risk factors ?Suicide prevention and interventions ?National Suicide Hotline telephone number ?[facility] assessment telephone number ?[facility] Emergency Assistance 911 ?[place] and/or Residential Mobile Crisis Unit telephone number ?  ?Request made of family/significant other to: ?Remove weapons (e.g., guns, rifles, knives), all items previously/currently identified as safety concern.   ?Remove drugs/medications (over the counter, prescriptions, illicit drugs), all items previously/currently identified as a safety concern.  ?   ?

## 2021-08-01 NOTE — Discharge Summary (Signed)
Daniel Holmes Able to be D/C'd Home per NP order. Discussed with the patient and all questions fully answered. An After Visit Summary was printed and given to the patient. Patient escorted out and D/C home via [employer]. ?Clois Dupes  ?08/01/2021 10:44 AM ?  ?   ?

## 2021-08-17 ENCOUNTER — Telehealth (HOSPITAL_COMMUNITY): Payer: Self-pay | Admitting: Family Medicine

## 2021-08-17 NOTE — BH Assessment (Signed)
Care Management - [place] Follow Up Discharges  ? ?Writer attempted to make contact with patient today and was unsuccessful.  Writer left a HIPPA compliant voice message at the [place] Collateral: [name] [phone] owner of ALF.   ? ?Per chart review, patient will follow up with established mental health providers at the [facility]. ?

## 2021-08-31 ENCOUNTER — Other Ambulatory Visit: Payer: Self-pay

## 2021-08-31 MED ORDER — LEVOCETIRIZINE DIHYDROCHLORIDE 5 MG PO TABS: ORAL_TABLET | ORAL | 3 refills | Status: AC

## 2021-09-07 ENCOUNTER — Ambulatory Visit
Admission: EM | Admit: 2021-09-07 | Discharge: 2021-09-07 | Disposition: A | Discharge status: Home / Self Care | Payer: Medicaid Other | Attending: Internal Medicine | Admitting: Internal Medicine

## 2021-09-07 ENCOUNTER — Ambulatory Visit (INDEPENDENT_AMBULATORY_CARE_PROVIDER_SITE_OTHER): Payer: Medicaid Other

## 2021-09-07 DIAGNOSIS — M545 Low back pain, unspecified: Secondary | ICD-10-CM

## 2021-09-07 LAB — POCT URINALYSIS DIP (MANUAL ENTRY)
Bilirubin, UA: NEGATIVE
Glucose, UA: NEGATIVE mg/dL
Ketones, POC UA: NEGATIVE mg/dL
Leukocytes, UA: NEGATIVE
Nitrite, UA: NEGATIVE
Protein Ur, POC: NEGATIVE mg/dL
Spec Grav, UA: 1.01 (ref 1.010–1.025)
Urobilinogen, UA: 0.2 E.U./dL
pH, UA: 6.5 (ref 5.0–8.0)

## 2021-09-07 MED ORDER — LIDOCAINE 5 % EX PTCH: 1.0000 | MEDICATED_PATCH | CUTANEOUS | 0 refills | Status: AC

## 2021-09-07 NOTE — Discharge Instructions (Signed)
X-ray is showing signs of possible arthritis.  Lidocaine patch has been prescribed.  Please follow-up with orthopedist for further evaluation and management.  Also follow-up with primary care doctor. ?

## 2021-09-07 NOTE — ED Triage Notes (Signed)
Pt presents with c/o lower back pain X 2 days. Pt guardian states he has not been able to get out of his chair and states he has been unable to walk, put on his shoes or socks.  ? ?States he has been taking tylenol.  ?

## 2021-09-07 NOTE — ED Provider Notes (Signed)
?Daniel Holmes ? ? ? ?CSN: [id number] ?Arrival date & time: 09/07/21  1628 ? ? ?  ? ?History   ?Chief Complaint ?Chief Complaint  ?Patient presents with  ? Back Pain  ? ? ?HPI ?Daniel Holmes is a 51 y.o. male.  ? ?Patient presents with lower back pain that has been present for approximately 1 to 2 weeks.  Caregiver denies any obvious injury.  Caregiver reports that he has been favoring his back and is having difficulty when going from a sitting to standing position.  He also appears very uncomfortable at all times per caregiver.  Denies history of chronic back pain.  Patient has taken Tylenol and used heating pads and Biofreeze with minimal improvement in symptoms.  Patient denies radiation of pain down legs, urinary frequency, urinary or bowel incontinence, saddle anesthesia. ? ? ?Back Pain ? ?Past Medical History:  ?Diagnosis Date  ? Allergy   ? Asthma   ? Bipolar 1 disorder ([place])   ? Mental developmental delay   ? Psychosis ([place])   ? See [name]  ? ? ?Patient Active Problem List  ? Diagnosis Date Noted  ? Weight gain 03/10/2020  ? High risk medication use 11/14/2017  ? Upper airway cough syndrome 03/01/2017  ? Bilateral knee pain 02/08/2017  ? Encounter for psychological evaluation 09/29/2015  ? Bipolar 1 disorder ([place]) 11/08/2014  ? 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 Bipolar disorder with depression ([place]) 11/03/2014  ? 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 History of ADHD 11/03/2014  ? Other seasonal allergic rhinitis 11/05/2012  ? Hypothyroidism 03/28/2012  ? ? ?Past Surgical History:  ?Procedure Laterality Date  ? HERNIA REPAIR    ? ? ? ? ? ?Home Medications   ? ?Prior to Admission medications   ?Medication Sig Start Date End Date Taking? Authorizing Provider  ?lidocaine (LIDODERM) 5 % Place 1 patch onto the skin daily. Remove & Discard patch within 12 hours or as directed by MD 09/07/21  Yes Teodora Medici, FNP  ?acetaminophen (TYLENOL) 325 MG tablet  Take 650 mg by mouth every 6 (six) hours as needed for moderate pain or headache.    [provider]  ?cetirizine (ZYRTEC) 10 MG tablet TAKE 1 TABLET BY MOUTH DAILY. ?Patient taking differently: Take 10 mg by mouth in the morning. 02/28/21   Lind Covert, MD  ?cloNIDine (CATAPRES) 0.1 MG tablet Take 0.1 mg by mouth in the morning and at bedtime.    [provider]  ?guaiFENesin (TUSSIN PO) Take 20 mLs by mouth every 6 (six) hours as needed (cough).    [provider]  ?hydrOXYzine (ATARAX/VISTARIL) 25 MG tablet Take 1 tablet (25 mg total) by mouth every 8 (eight) hours as needed. ?Patient taking differently: Take 25 mg by mouth in the morning and at bedtime. 11/10/20   [name], MD  ?hydrOXYzine (ATARAX/VISTARIL) 50 MG tablet Take 50 mg by mouth daily as needed for anxiety (for agitation).    [provider]  ?lamoTRIgine (LAMICTAL) 100 MG tablet Take 100 mg by mouth at bedtime.    [provider]  ?levocetirizine (XYZAL) 5 MG tablet Take one daily 08/31/21   Lind Covert, MD  ?levothyroxine (SYNTHROID) 75 MCG tablet Take 1 tablet (75 mcg total) by mouth daily before breakfast. 04/04/21   Lind Covert, MD  ?lithium carbonate (ESKALITH) 450 MG CR tablet Take 1 tablet (450 mg total) by mouth every 12 (twelve) hours. ?Patient taking differently:  Take 450 mg by mouth in the morning and at bedtime. 11/10/14   Pucilowska, Wardell Honour, MD  ?Multiple Vitamins-Minerals (SENTRY SENIOR) TABS Take 1 tablet by mouth daily. 07/05/21   Lind Covert, MD  ?OLANZapine (ZYPREXA) 10 MG tablet Take 10 mg by mouth in the morning and at bedtime.    [provider]  ?OLANZapine (ZYPREXA) 20 MG tablet Take 1.5 tablets (30 mg total) by mouth at bedtime. Take 1 and 1/2 tablets by mouth at bedtime. ?Patient not taking: Reported on 07/30/2021 11/10/14   Pucilowska, Wardell Honour, MD  ?QUEtiapine (SEROQUEL) 100 MG tablet Take 100 mg by mouth at bedtime.    [provider]  ?sodium chloride (SALINE MIST) 0.65 % nasal spray USE 2 SPRAYS IN EACH NOSTRIL 4 TIMES DAILY. ?Patient taking differently: Place 2 sprays into the nose 4 (four) times daily. 11/09/20   Lind Covert, MD  ?Throat Lozenges (COUGH DROPS MT) Use as directed 1 drop in the mouth or throat as needed (cough drops).    [provider]  ?traZODone (DESYREL) 100 MG tablet Take 100 mg by mouth at bedtime.    [provider]  ? ? ?Family History ?Family History  ?Problem Relation Age of Onset  ? Colon cancer Neg Hx   ? Esophageal cancer Neg Hx   ? Stomach cancer Neg Hx   ? Rectal cancer Neg Hx   ? ? ?Social History ?Social History  ? ?Tobacco Use  ? Smoking status: Former  ?  Packs/day: 1.00  ?  Years: 10.00  ?  Pack years: 10.00  ?  Types: Cigarettes  ?  Quit date: 05/29/1998  ?  Years since quitting: 23.2  ? Smokeless tobacco: Never  ?Vaping Use  ? Vaping Use: Never used  ?Substance Use Topics  ? Alcohol use: No  ? Drug use: No  ? ? ? ?Allergies   ?Bee pollen, Bee venom, and Penicillins ? ? ?Review of Systems ?Review of Systems ?Per HPI ? ?Physical Exam ?Triage Vital Signs ?ED Triage Vitals  ?Enc Vitals Group  ?   BP 09/07/21 1727 (!) 143/77  ?   Pulse Rate 09/07/21 1727 99  ?   Resp 09/07/21 1727 18  ?   Temp 09/07/21 1727 98.4 ?F (36.9 ?C)  ?   Temp Source 09/07/21 1727 Oral  ?   SpO2 09/07/21 1727 95 %  ?   Weight --   ?   Height --   ?   Head Circumference --   ?   Peak Flow --   ?   Pain Score 09/07/21 1726 7  ?   Pain Loc --   ?   Pain Edu? --   ?   Excl. in [place]? --   ? ?No data found. ? ?Updated Vital Signs ?BP (!) 143/77 (BP Location: Left Arm)   Pulse 99   Temp 98.4 ?F (36.9 ?C) (Oral)   Resp 18   SpO2 95%  ? ?Visual Acuity ?Right Eye Distance:   ?Left Eye Distance:   ?Bilateral Distance:   ? ?Right Eye Near:   ?Left Eye Near:    ?Bilateral Near:    ? ?Physical Exam ?Constitutional:   ?   General: He is not in acute distress. ?   Appearance: Normal appearance. He is not  toxic-appearing or diaphoretic.  ?HENT:  ?   Head: Normocephalic and atraumatic.  ?Eyes:  ?   Extraocular Movements: Extraocular movements intact.  ?  Conjunctiva/sclera: Conjunctivae normal.  ?Pulmonary:  ?   Effort: Pulmonary effort is normal.  ?Musculoskeletal:  ?   Cervical back: Normal.  ?   Thoracic back: Normal.  ?   Lumbar back: Tenderness present. No swelling or edema.  ?   Comments: Tenderness to palpation across lower lumbar region.  There is direct spinal tenderness.  No crepitus or step-off noted.  Unable to do straight leg raise due to patient cooperation.  ?Neurological:  ?   General: No focal deficit present.  ?   Mental Status: He is alert and oriented to person, place, and time. Mental status is at baseline.  ?   Deep Tendon Reflexes: Reflexes are normal and symmetric.  ?Psychiatric:     ?   Mood and Affect: Mood normal.     ?   Behavior: Behavior normal.     ?   Thought Content: Thought content normal.     ?   Judgment: Judgment normal.  ? ? ? ?UC Treatments / Results  ?Labs ?(all labs ordered are listed, but only abnormal results are displayed) ?Labs Reviewed  ?POCT URINALYSIS DIP (MANUAL ENTRY) - Abnormal; Notable for the following components:  ?    Result Value  ? Blood, UA trace-intact (*)   ? All other components within normal limits  ? ? ?EKG ? ? ?Radiology ?DG Lumbar Spine Complete ? ?Result Date: 09/07/2021 ?CLINICAL DATA:  Low back pain for 2 days. EXAM: LUMBAR SPINE - COMPLETE 4+ VIEW COMPARISON:  08/13/2015 FINDINGS: There are 5 non-rib-bearing lumbar vertebra. The alignment is maintained. Vertebral body heights are normal. There is no listhesis. The posterior elements are intact. Slight endplate spurring at E9-F8 with borderline disc space narrowing. No fracture, bony destruction or evidence of focal lesion. Sacroiliac joints are symmetric and normal. IMPRESSION: Minimal degenerative change in the lumbar spine without acute osseous abnormality. Electronically Signed   By: Keith Rake M.D.   On: 09/07/2021 18:09   ? ?Procedures ?Procedures (including critical care time) ? ?Medications Ordered in UC ?Medications - No data to display ? ?Initial Impression / Assessment and Plan / UC Course  ?I

## 2021-12-13 ENCOUNTER — Encounter: Payer: Self-pay | Admitting: Emergency Medicine

## 2021-12-13 ENCOUNTER — Ambulatory Visit
Admission: EM | Admit: 2021-12-13 | Discharge: 2021-12-13 | Disposition: A | Discharge status: Home / Self Care | Payer: Medicaid Other

## 2021-12-13 DIAGNOSIS — I959 Hypotension, unspecified: Secondary | ICD-10-CM

## 2021-12-13 NOTE — ED Triage Notes (Signed)
82/42 is the reported blood pressure, then 80/30.  These readings were documented today.  Bp usually 110/76.  No complaints of illness

## 2021-12-13 NOTE — Discharge Instructions (Signed)
Stop clonidine until rechecked by primary MD.

## 2021-12-18 NOTE — ED Provider Notes (Signed)
[facility]    CSN: [id number] Arrival date & time: 12/13/21  1747      History   Chief Complaint No chief complaint on file.   HPI Daniel Holmes is a 51 y.o. male.   Patient is here with his care provider who reports his blood pressure has been low today patient was receiving a physical exam and blood pressure was noted to be 80/40.  Care provider recheck patient's blood pressure and his blood pressure was still in the 90/50 range.  Patient has a past medical history of intellectual disability.  He is not on any blood pressure medications.  Patient denies any current symptoms he is not dizzy he denies any weakness he does not have any difficulty ambulating  The history is provided by the patient. No language interpreter was used.    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 Authorizing Provider  acetaminophen (TYLENOL) 325 MG tablet Take 650 mg by mouth every 6 (six) hours as needed for moderate pain or headache.    [provider]  cetirizine (ZYRTEC) 10 MG tablet TAKE 1 TABLET BY MOUTH DAILY. Patient taking  differently: Take 10 mg by mouth in the morning. 02/28/21   Lind Covert, MD  cloNIDine (CATAPRES) 0.1 MG tablet Take 0.1 mg by mouth in the morning and at bedtime.    [provider]  cloZAPine (CLOZARIL) 25 MG tablet Take 25 mg by mouth at bedtime. 08/12/21   [provider]  divalproex (DEPAKOTE) 250 MG DR tablet Take by mouth daily. 11/28/21   [provider]  guaiFENesin (TUSSIN PO) Take 20 mLs by mouth every 6 (six) hours as needed (cough).    [provider]  hydrOXYzine (ATARAX/VISTARIL) 25 MG tablet Take 1 tablet (25 mg total) by mouth every 8 (eight) hours as needed. Patient taking differently: Take 25 mg by mouth in the morning and at bedtime. 11/10/20   [name], MD  hydrOXYzine (ATARAX/VISTARIL) 50 MG tablet Take 50 mg by mouth daily as needed for anxiety (for agitation).    [provider]  lamoTRIgine (LAMICTAL) 100 MG tablet Take 100 mg by mouth at bedtime.    [provider]  levocetirizine (XYZAL) 5 MG tablet Take one daily 08/31/21   Lind Covert, MD  levothyroxine (SYNTHROID) 75 MCG tablet Take 1 tablet (75 mcg total) by mouth daily before breakfast. 04/04/21   Chambliss, Jeb Levering, MD  lidocaine (LIDODERM) 5 % Place 1 patch onto the skin daily. Remove & Discard patch within 12 hours or  as directed by MD 09/07/21   Teodora Medici, FNP  lithium carbonate (ESKALITH) 450 MG CR tablet Take 1 tablet (450 mg total) by mouth every 12 (twelve) hours. Patient taking differently: Take 450 mg by mouth in the morning and at bedtime. 11/10/14   Pucilowska, Wardell Honour, MD  Multiple Vitamins-Minerals (SENTRY SENIOR) TABS Take 1 tablet by mouth daily. 07/05/21   Lind Covert, MD  OLANZapine (ZYPREXA) 10 MG tablet Take 10 mg by mouth in the morning and at bedtime.    [provider]  OLANZapine (ZYPREXA) 20 MG tablet Take 1.5 tablets (30 mg total) by mouth at bedtime. Take 1 and 1/2 tablets by mouth at  bedtime. Patient not taking: Reported on 07/30/2021 11/10/14   Pucilowska, Herma Ard B, MD  QUEtiapine (SEROQUEL) 100 MG tablet Take 100 mg by mouth at bedtime.    [provider]  sodium chloride (SALINE MIST) 0.65 % nasal spray USE 2 SPRAYS IN EACH NOSTRIL 4 TIMES DAILY. Patient taking differently: Place 2 sprays into the nose 4 (four) times daily. 11/09/20   Lind Covert, MD  Throat Lozenges (COUGH DROPS MT) Use as directed 1 drop in the mouth or throat as needed (cough drops).    [provider]  traZODone (DESYREL) 100 MG tablet Take 100 mg by mouth at bedtime.    [provider]    Family History Family History  Problem Relation Age of Onset   Colon cancer Neg Hx    Esophageal cancer Neg Hx    Stomach cancer Neg Hx    Rectal cancer Neg Hx     Social History Social History   Tobacco Use   Smoking status: Former    Packs/day: 1.00    Years: 10.00    Total pack years: 10.00    Types: Cigarettes    Quit date: 05/29/1998    Years since quitting: 23.5   Smokeless tobacco: Never  Vaping Use   Vaping Use: Never used  Substance Use Topics   Alcohol use: No   Drug use: No     Allergies   Bee pollen, Bee venom, and Penicillins   Review of Systems Review of Systems  All other systems reviewed and are negative.    Physical Exam Triage Vital Signs ED Triage Vitals  Enc Vitals Group     BP 12/13/21 1828 108/70     Pulse Rate 12/13/21 1828 67     Resp 12/13/21 1828 14     Temp 12/13/21 1828 97.7 F (36.5 C)     Temp Source 12/13/21 1828 Oral     SpO2 12/13/21 1828 98 %     Weight --      Height --      Head Circumference --      Peak Flow --      Pain Score 12/13/21 1846 0     Pain Loc --      Pain Edu? --      Excl. in [place]? --    No data found.  Updated Vital Signs BP 108/70 (BP Location: Right Arm)   Pulse 67   Temp 97.7 F (36.5 C) (Oral)   Resp 14   SpO2 98%   Visual Acuity Right Eye Distance:   Left Eye Distance:    Bilateral Distance:    Right Eye Near:   Left Eye Near:    Bilateral Near:     Physical Exam Vitals and nursing note reviewed.  Constitutional:  Appearance: He is well-developed.  HENT:     Head: Normocephalic.  Cardiovascular:     Rate and Rhythm: Normal rate.  Pulmonary:     Effort: Pulmonary effort is normal.  Abdominal:     General: There is no distension.  Musculoskeletal:        General: Normal range of motion.     Cervical back: Normal range of motion.  Skin:    General: Skin is warm.  Neurological:     General: No focal deficit present.     Mental Status: He is alert and oriented to person, place, and time.  Psychiatric:        Mood and Affect: Mood normal.      UC Treatments / Results  Labs (all labs ordered are listed, but only abnormal results are displayed) Labs Reviewed - No data to display  EKG   Radiology No results found.  Procedures Procedures (including critical care time)  Medications Ordered in UC Medications - No data to display  Initial Impression / Assessment and Plan / UC Course  I have reviewed the triage vital signs and the nursing notes.  Pertinent labs & imaging results that were available during my care of the patient were reviewed by me and considered in my medical decision making (see chart for details).     Patient observed blood pressure rechecked multiple times patient's blood pressure is stable he is asymptomatic.  I reviewed his medications he is on clonidine.  He is on multiple psychiatric medications which could also lower your blood pressure he is to hold clonidine until follow-up with primary care physician Final Clinical Impressions(s) / UC Diagnoses   Final diagnoses:  Hypotension, unspecified hypotension type     Discharge Instructions      Stop clonidine until rechecked by primary MD.    ED Prescriptions   None    PDMP not reviewed this encounter.   Fransico Meadow, [place] 12/18/21 2125

## 2022-01-14 ENCOUNTER — Encounter (HOSPITAL_COMMUNITY): Payer: Self-pay | Admitting: Emergency Medicine

## 2022-01-14 ENCOUNTER — Emergency Department (HOSPITAL_COMMUNITY): Payer: Medicaid Other

## 2022-01-14 ENCOUNTER — Emergency Department (HOSPITAL_COMMUNITY)
Admission: EM | Admit: 2022-01-14 | Discharge: 2022-01-14 | Disposition: A | Discharge status: Home / Self Care | Payer: Medicaid Other | Attending: Student | Admitting: Student

## 2022-01-14 DIAGNOSIS — Z87891 Personal history of nicotine dependence: Secondary | ICD-10-CM | POA: Insufficient documentation

## 2022-01-14 DIAGNOSIS — Z79899 Other long term (current) drug therapy: Secondary | ICD-10-CM | POA: Insufficient documentation

## 2022-01-14 DIAGNOSIS — E039 Hypothyroidism, unspecified: Secondary | ICD-10-CM | POA: Diagnosis not present

## 2022-01-14 DIAGNOSIS — R051 Acute cough: Secondary | ICD-10-CM | POA: Insufficient documentation

## 2022-01-14 DIAGNOSIS — J45909 Unspecified asthma, uncomplicated: Secondary | ICD-10-CM | POA: Diagnosis not present

## 2022-01-14 DIAGNOSIS — R059 Cough, unspecified: Secondary | ICD-10-CM | POA: Diagnosis present

## 2022-01-14 DIAGNOSIS — F419 Anxiety disorder, unspecified: Secondary | ICD-10-CM | POA: Insufficient documentation

## 2022-01-14 LAB — COMPREHENSIVE METABOLIC PANEL
ALT: 34 U/L (ref 0–44)
AST: 28 U/L (ref 15–41)
Albumin: 3.8 g/dL (ref 3.5–5.0)
Alkaline Phosphatase: 57 U/L (ref 38–126)
Anion gap: 6 (ref 5–15)
BUN: 8 mg/dL (ref 6–20)
CO2: 25 mmol/L (ref 22–32)
Calcium: 9.8 mg/dL (ref 8.9–10.3)
Chloride: 111 mmol/L (ref 98–111)
Creatinine, Ser: 1.1 mg/dL (ref 0.61–1.24)
GFR, Estimated: >60 mL/min (ref >60)
Glucose, Bld: 131 mg/dL — ABNORMAL HIGH (ref 70–99)
Potassium: 4.2 mmol/L (ref 3.5–5.1)
Sodium: 142 mmol/L (ref 135–145)
Total Bilirubin: 0.6 mg/dL (ref 0.3–1.2)
Total Protein: 5.9 g/dL — ABNORMAL LOW (ref 6.5–8.1)

## 2022-01-14 LAB — CBC
HCT: 36.4 % — ABNORMAL LOW (ref 39.0–52.0)
Hemoglobin: 12.1 g/dL — ABNORMAL LOW (ref 13.0–17.0)
MCH: 32.8 pg (ref 26.0–34.0)
MCHC: 33.2 g/dL (ref 30.0–36.0)
MCV: 98.6 fL (ref 80.0–100.0)
Platelets: 133 10*3/uL — ABNORMAL LOW (ref 150–400)
RBC: 3.69 MIL/uL — ABNORMAL LOW (ref 4.22–5.81)
RDW: 12.4 % (ref 11.5–15.5)
WBC: 3.2 10*3/uL — ABNORMAL LOW (ref 4.0–10.5)
nRBC: 0 % (ref 0.0–0.2)

## 2022-01-14 LAB — RAPID URINE DRUG SCREEN, HOSP PERFORMED
Amphetamines: NOT DETECTED
Barbiturates: NOT DETECTED
Benzodiazepines: NOT DETECTED
Cocaine: NOT DETECTED
Opiates: NOT DETECTED
Tetrahydrocannabinol: NOT DETECTED

## 2022-01-14 LAB — ETHANOL: Alcohol, Ethyl (B): <10 mg/dL (ref <10)

## 2022-01-14 LAB — SALICYLATE LEVEL: Salicylate Lvl: <7 mg/dL — ABNORMAL LOW (ref 7.0–30.0)

## 2022-01-14 LAB — ACETAMINOPHEN LEVEL: Acetaminophen (Tylenol), Serum: <10 ug/mL — ABNORMAL LOW (ref 10–30)

## 2022-01-14 MED ORDER — BENZONATATE 100 MG PO CAPS
200.0000 mg | ORAL_CAPSULE | Freq: Once | ORAL | Status: AC
  Administered 2022-01-14: 200 mg via ORAL
  Filled 2022-01-14: qty 2

## 2022-01-14 MED ORDER — BENZONATATE 100 MG PO CAPS: 100.0000 mg | ORAL_CAPSULE | Freq: Three times a day (TID) | ORAL | 0 refills | Status: AC

## 2022-01-14 NOTE — ED Notes (Signed)
Patient initially denies suicidal and homicidal ideation. Patient's legal guardian Shea Evans arrived to hospital and expressed that the patient regularly go to emergency departments requesting "mental health help" and she is frustrated about what seems like a repeating cycle of requesting help and being discharged home after evaluation. Patient requests inpatient admission to a behavioral health hospital.  When patient's legal guardian mentions to patient that she would like to take him home and have him go directly to his agency for evaluation on Monday, patient states if he goes home with her he will jump in front of a car.  After patient's legal guardian left the department, patient was questioned again about suicidal ideation which he denied.

## 2022-01-14 NOTE — ED Notes (Signed)
Pt's legal guardian here to take pt home. Discharge instructions reviewed, no further questions

## 2022-01-15 NOTE — ED Provider Notes (Signed)
[place] EMERGENCY DEPARTMENT Provider Note  CSN: [id number] Arrival date & time: 01/14/22 1633  Chief Complaint(s) Medical Clearance  HPI REGINA COPPOLINO is a 51 y.o. male with PMH bipolar 1, asthma, developmental delay who presents emergency department for evaluation of a cough and need for mental health evaluation.  The circumstances surrounding the patient's presenting complaint are little unclear as the patient initially called EMS for mental health help, he initially denied suicidal ideation to the triage nurse but when his legal guardian came to pick him up from the hospital lobby, the patient states that he would jump in front of a car to kill himself.  I spoke with his legal guardian who states that this is not an infrequent occurrence for this patient, and she is happy to return if he is medically clear.  On my evaluation, patient is primarily concerned about a recurrent cough and is coughing frequently during my exam.  Patient with active tardive dyskinesia my exam.  He adamantly denies suicidal ideation, homicidal ideation, auditory or visual hallucinations today in the ER.   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 Authorizing Provider  benzonatate (TESSALON) 100 MG capsule Take 1 capsule (100 mg total) by mouth every 8 (eight) hours. 01/14/22  Yes Rainbow Salman, MD  acetaminophen (TYLENOL) 325 MG tablet Take 650 mg by mouth every 6 (six) hours as needed for moderate pain or headache.    [provider]  cetirizine (ZYRTEC) 10 MG tablet TAKE 1 TABLET BY MOUTH DAILY. Patient taking differently: Take 10 mg by mouth in the morning. 02/28/21   Lind Covert, MD  cloNIDine (CATAPRES) 0.1 MG tablet Take 0.1 mg by mouth in the morning and at bedtime.    [provider]  cloZAPine (CLOZARIL) 25 MG tablet Take 25 mg by mouth at bedtime. 08/12/21   [provider]  divalproex (DEPAKOTE) 250 MG DR tablet Take by mouth daily. 11/28/21   [provider]  guaiFENesin (TUSSIN PO) Take 20 mLs by mouth every 6 (six) hours as needed (cough).    [provider]  hydrOXYzine (ATARAX/VISTARIL) 25 MG tablet Take 1 tablet (25 mg total) by mouth every 8 (eight) hours as needed. Patient taking differently: Take 25 mg by mouth in the morning and at bedtime. 11/10/20   [name], MD  hydrOXYzine (ATARAX/VISTARIL) 50 MG tablet Take 50 mg by mouth daily as needed for anxiety (for agitation).    [provider]  lamoTRIgine (LAMICTAL) 100 MG tablet Take 100 mg by mouth at bedtime.    [provider]  levocetirizine (XYZAL) 5 MG tablet Take one daily 08/31/21   Chambliss, Jeb Levering,  MD  levothyroxine (SYNTHROID) 75 MCG tablet Take 1 tablet (75 mcg total) by mouth daily before breakfast. 04/04/21   Chambliss, Jeb Levering, MD  lidocaine (LIDODERM) 5 % Place 1 patch onto the skin daily. Remove & Discard patch within 12 hours or as directed by MD 09/07/21   Teodora Medici, FNP  lithium carbonate (ESKALITH) 450 MG CR tablet Take 1 tablet (450 mg total) by mouth every 12 (twelve) hours. Patient taking differently: Take 450 mg by mouth  in the morning and at bedtime. 11/10/14   Pucilowska, Wardell Honour, MD  Multiple Vitamins-Minerals (SENTRY SENIOR) TABS Take 1 tablet by mouth daily. 07/05/21   Lind Covert, MD  OLANZapine (ZYPREXA) 10 MG tablet Take 10 mg by mouth in the morning and at bedtime.    [provider]  OLANZapine (ZYPREXA) 20 MG tablet Take 1.5 tablets (30 mg total) by mouth at bedtime. Take 1 and 1/2 tablets by mouth at bedtime. Patient not taking: Reported on 07/30/2021 11/10/14   Pucilowska, Herma Ard B, MD  QUEtiapine (SEROQUEL) 100 MG tablet Take 100 mg by mouth at bedtime.    [provider]  sodium chloride (SALINE MIST) 0.65 % nasal spray USE 2 SPRAYS IN EACH NOSTRIL 4 TIMES DAILY. Patient taking differently: Place 2 sprays into the nose 4 (four) times daily. 11/09/20   Lind Covert, MD  Throat Lozenges (COUGH DROPS MT) Use as directed 1 drop in the mouth or throat as needed (cough drops).    [provider]  traZODone (DESYREL) 100 MG tablet Take 100 mg by mouth at bedtime.    [provider]                                                                                                                                    Past Surgical History Past Surgical History:  Procedure Laterality Date   HERNIA REPAIR     Family History Family History  Problem Relation Age of Onset   Colon cancer Neg Hx    Esophageal cancer Neg Hx    Stomach cancer Neg Hx    Rectal cancer Neg Hx     Social History Social History   Tobacco Use   Smoking status: Former    Packs/day: 1.00    Years: 10.00    Total pack years: 10.00    Types: Cigarettes    Quit date: 05/29/1998    Years since quitting: 23.6   Smokeless tobacco: Never  Vaping Use   Vaping Use: Never used  Substance Use Topics   Alcohol use: No   Drug use: No   Allergies Bee pollen, Bee venom, and Penicillins  Review of Systems Review of Systems  Respiratory:  Positive for cough.     Physical Exam Vital  Signs  I have reviewed the triage vital signs BP (!) 150/52 (BP Location: Right Arm)   Pulse 64   Temp 98.2  F (36.8 C)   Resp 18   SpO2 99%   Physical Exam Vitals and nursing note reviewed.  Constitutional:      General: He is not in acute distress.    Appearance: He is well-developed.  HENT:     Head: Normocephalic and atraumatic.  Eyes:     Conjunctiva/sclera: Conjunctivae normal.  Cardiovascular:     Rate and Rhythm: Normal rate and regular rhythm.     Heart sounds: No murmur heard. Pulmonary:     Effort: Pulmonary effort is normal. No respiratory distress.     Breath sounds: Normal breath sounds.  Abdominal:     Palpations: Abdomen is soft.     Tenderness: There is no abdominal tenderness.  Musculoskeletal:        General: No swelling.     Cervical back: Neck supple.  Skin:    General: Skin is warm and dry.     Capillary Refill: Capillary refill takes less than 2 seconds.  Neurological:     Mental Status: He is alert.  Psychiatric:        Mood and Affect: Mood normal.     ED Results and Treatments Labs (all labs ordered are listed, but only abnormal results are displayed) Labs Reviewed  COMPREHENSIVE METABOLIC PANEL - Abnormal; Notable for the following components:      Result Value   Glucose, Bld 131 (*)    Total Protein 5.9 (*)    All other components within normal limits  SALICYLATE LEVEL - Abnormal; Notable for the following components:   Salicylate Lvl <3.6 (*)    All other components within normal limits  ACETAMINOPHEN LEVEL - Abnormal; Notable for the following components:   Acetaminophen (Tylenol), Serum <10 (*)    All other components within normal limits  CBC - Abnormal; Notable for the following components:   WBC 3.2 (*)    RBC 3.69 (*)    Hemoglobin 12.1 (*)    HCT 36.4 (*)    Platelets 133 (*)    All other components within normal limits  ETHANOL  RAPID URINE DRUG SCREEN, [place]                                                                                                                           Radiology DG Chest 2 View  Result Date: 01/14/2022 CLINICAL DATA:  Cough EXAM: CHEST - 2 VIEW COMPARISON:  01/11/2020 FINDINGS: The heart size and mediastinal contours are within normal limits. Both lungs are clear. The visualized skeletal structures are unremarkable. IMPRESSION: No active cardiopulmonary disease. Electronically Signed   By: Rolm Baptise M.D.   On: 01/14/2022 20:14    Pertinent labs & imaging results that were available during my care of the patient were reviewed by me and considered in my medical decision making (see MDM for details).  Medications Ordered in ED Medications  benzonatate (TESSALON) capsule 200 mg (200 mg Oral Given 01/14/22 2019)  Procedures Procedures  (including critical care time)  Medical Decision Making / ED Course   This patient presents to the ED for concern of cough, this involves an extensive number of treatment options, and is a complaint that carries with it a high risk of complications and morbidity.  The differential diagnosis includes bronchitis, pneumonia, reactive airway disease, viral URI  MDM: Seen in the emergency room for evaluation of a cough and need for mental health evaluation.  Physical exam reveals frequent coughing but no wheezing heard on exam.  Laboratory evaluation is unremarkable outside of a mild leukopenia to 3.2, anemia to 12.1, thrombocytopenia to 133.  UDS negative.  Chest x-ray unremarkable.  Patient given Ladona Ridgel with improvement of his cough.  Patient has no psychiatric complaints on my exam today and TTS evaluation is not warranted at this time.  Patient then discharged in the care of his guardian who came to pick him up at bedside.   Additional history obtained: -Additional history obtained from  guardian -External records from outside source obtained and reviewed including: Chart review including previous notes, labs, imaging, consultation notes   Lab Tests: -I ordered, reviewed, and interpreted labs.   The pertinent results include:   Labs Reviewed  COMPREHENSIVE METABOLIC PANEL - Abnormal; Notable for the following components:      Result Value   Glucose, Bld 131 (*)    Total Protein 5.9 (*)    All other components within normal limits  SALICYLATE LEVEL - Abnormal; Notable for the following components:   Salicylate Lvl <1.4 (*)    All other components within normal limits  ACETAMINOPHEN LEVEL - Abnormal; Notable for the following components:   Acetaminophen (Tylenol), Serum <10 (*)    All other components within normal limits  CBC - Abnormal; Notable for the following components:   WBC 3.2 (*)    RBC 3.69 (*)    Hemoglobin 12.1 (*)    HCT 36.4 (*)    Platelets 133 (*)    All other components within normal limits  ETHANOL  RAPID URINE DRUG SCREEN, HOSP PERFORMED      Imaging Studies ordered: I ordered imaging studies including NSR I independently visualized and interpreted imaging. I agree with the radiologist interpretation   Medicines ordered and prescription drug management: Meds ordered this encounter  Medications   benzonatate (TESSALON) capsule 200 mg   benzonatate (TESSALON) 100 MG capsule    Sig: Take 1 capsule (100 mg total) by mouth every 8 (eight) hours.    Dispense:  21 capsule    Refill:  0    -I have reviewed the patients home medicines and have made adjustments as needed  Critical interventions none  Cardiac Monitoring: The patient was maintained on a cardiac monitor.  I personally viewed and interpreted the cardiac monitored which showed an underlying rhythm of: NSR  Social Determinants of Health:  Factors impacting patients care include: none   Reevaluation: After the interventions noted above, I reevaluated the patient and found  that they have :improved  Co morbidities that complicate the patient evaluation  Past Medical History:  Diagnosis Date   Allergy    Asthma    Bipolar 1 disorder ([place])    Mental developmental delay    Psychosis [facility])    See Dr Laverta [place] [name]      Dispostion: I considered admission for this patient, but he does not meet inpatient criteria for admission is safe for discharge outpatient follow-up  Final Clinical Impression(s) / ED Diagnoses Final diagnoses:  Acute cough  Anxiety     '@PCDICTATION'$ @    Teressa Lower, MD 01/15/22 1810

## 2022-08-04 ENCOUNTER — Emergency Department (HOSPITAL_COMMUNITY)
Admission: EM | Admit: 2022-08-04 | Discharge: 2022-08-04 | Disposition: A | Discharge status: Home / Self Care | Payer: Medicaid Other | Attending: Emergency Medicine | Admitting: Emergency Medicine

## 2022-08-04 ENCOUNTER — Emergency Department (HOSPITAL_COMMUNITY): Payer: Medicaid Other

## 2022-08-04 DIAGNOSIS — U071 COVID-19: Secondary | ICD-10-CM | POA: Diagnosis not present

## 2022-08-04 DIAGNOSIS — J45909 Unspecified asthma, uncomplicated: Secondary | ICD-10-CM | POA: Diagnosis not present

## 2022-08-04 DIAGNOSIS — R531 Weakness: Secondary | ICD-10-CM | POA: Diagnosis present

## 2022-08-04 LAB — CBC WITH DIFFERENTIAL/PLATELET
Abs Immature Granulocytes: 0.02 10*3/uL (ref 0.00–0.07)
Basophils Absolute: 0 10*3/uL (ref 0.0–0.1)
Basophils Relative: 0 %
Eosinophils Absolute: 0 10*3/uL (ref 0.0–0.5)
Eosinophils Relative: 0 %
HCT: 38.6 % — ABNORMAL LOW (ref 39.0–52.0)
Hemoglobin: 12.9 g/dL — ABNORMAL LOW (ref 13.0–17.0)
Immature Granulocytes: 0 %
Lymphocytes Relative: 8 %
Lymphs Abs: 0.5 10*3/uL — ABNORMAL LOW (ref 0.7–4.0)
MCH: 32.7 pg (ref 26.0–34.0)
MCHC: 33.4 g/dL (ref 30.0–36.0)
MCV: 97.7 fL (ref 80.0–100.0)
Monocytes Absolute: 0.5 10*3/uL (ref 0.1–1.0)
Monocytes Relative: 8 %
Neutro Abs: 4.8 10*3/uL (ref 1.7–7.7)
Neutrophils Relative %: 84 %
Platelets: 111 10*3/uL — ABNORMAL LOW (ref 150–400)
RBC: 3.95 MIL/uL — ABNORMAL LOW (ref 4.22–5.81)
RDW: 12.8 % (ref 11.5–15.5)
WBC: 5.8 10*3/uL (ref 4.0–10.5)
nRBC: 0 % (ref 0.0–0.2)

## 2022-08-04 LAB — RESP PANEL BY RT-PCR (RSV, FLU A&B, COVID)  RVPGX2
Influenza A by PCR: NEGATIVE
Influenza B by PCR: NEGATIVE
Resp Syncytial Virus by PCR: NEGATIVE
SARS Coronavirus 2 by RT PCR: POSITIVE — AB

## 2022-08-04 LAB — COMPREHENSIVE METABOLIC PANEL
ALT: 42 U/L (ref 0–44)
AST: 49 U/L — ABNORMAL HIGH (ref 15–41)
Albumin: 4.4 g/dL (ref 3.5–5.0)
Alkaline Phosphatase: 71 U/L (ref 38–126)
Anion gap: 9 (ref 5–15)
BUN: 12 mg/dL (ref 6–20)
CO2: 23 mmol/L (ref 22–32)
Calcium: 9.6 mg/dL (ref 8.9–10.3)
Chloride: 106 mmol/L (ref 98–111)
Creatinine, Ser: 1.06 mg/dL (ref 0.61–1.24)
GFR, Estimated: >60 mL/min (ref >60)
Glucose, Bld: 106 mg/dL — ABNORMAL HIGH (ref 70–99)
Potassium: 3.5 mmol/L (ref 3.5–5.1)
Sodium: 138 mmol/L (ref 135–145)
Total Bilirubin: 0.5 mg/dL (ref 0.3–1.2)
Total Protein: 7.6 g/dL (ref 6.5–8.1)

## 2022-08-04 LAB — LACTIC ACID, PLASMA: Lactic Acid, Venous: 1.2 mmol/L (ref 0.5–1.9)

## 2022-08-04 MED ORDER — LACTATED RINGERS IV SOLN: INTRAVENOUS | Status: DC

## 2022-08-04 MED ORDER — ACETAMINOPHEN 500 MG PO TABS
1000.0000 mg | ORAL_TABLET | Freq: Once | ORAL | Status: AC
  Administered 2022-08-04: 1000 mg via ORAL
  Filled 2022-08-04: qty 2

## 2022-08-04 MED ORDER — LACTATED RINGERS IV BOLUS
1000.0000 mL | Freq: Once | INTRAVENOUS | Status: AC
  Administered 2022-08-04: 1000 mL via INTRAVENOUS

## 2022-08-04 MED ORDER — MOLNUPIRAVIR EUA 200MG CAPSULE: 4.0000 | ORAL_CAPSULE | Freq: Two times a day (BID) | ORAL | 0 refills | Status: AC

## 2022-08-04 NOTE — ED Provider Notes (Signed)
Daniel Holmes Note   CSN: [id number] Arrival date & time: 08/04/22  1406     History  Chief Complaint  Patient presents with   Weakness    Daniel Holmes is a 52 y.o. male.   Weakness    This is a 52 year old male with past medical history of mental developmental delay, bipolar 1, allergies and asthma presenting to the emergency department due to SIRS criteria.  Patient has a nonproductive cough and generalized weakness that started yesterday, his caregiver was positive for COVID starting yesterday so brought to urgent care for test.  Patient was found to be hypotensive, tachycardic and febrile so sent to ED for septic workup.  He tested positive for COVID while at the urgent care.  He denies any chest pain, shortness of breath, nausea, vomiting.  Home Medications Prior to Admission medications   Medication Sig Start Date End Date Taking? Authorizing Holmes  molnupiravir EUA (LAGEVRIO) 200 mg CAPS capsule Take 4 capsules (800 mg total) by mouth 2 (two) times daily for 5 days. 08/04/22 08/09/22 Yes Sherrill Raring, PA-C  acetaminophen (TYLENOL) 325 MG tablet Take 650 mg by mouth every 6 (six) hours as needed for moderate pain or headache.    Holmes, Historical, MD  benzonatate (TESSALON) 100 MG capsule Take 1 capsule (100 mg total) by mouth every 8 (eight) hours. 01/14/22   Kommor, [place], MD  cetirizine (ZYRTEC) 10 MG tablet TAKE 1 TABLET BY MOUTH DAILY. Patient taking differently: Take 10 mg by mouth in the morning. 02/28/21   Lind Covert, MD  cloNIDine (CATAPRES) 0.1 MG tablet Take 0.1 mg by mouth in the morning and at bedtime.    Holmes, Historical, MD  cloZAPine (CLOZARIL) 25 MG tablet Take 25 mg by mouth at bedtime. 08/12/21   Holmes, Historical, MD  divalproex (DEPAKOTE) 250 MG DR tablet Take by mouth daily. 11/28/21   Holmes, Historical, MD  guaiFENesin (TUSSIN PO) Take 20 mLs by mouth every 6 (six) hours as needed  (cough).    Holmes, Historical, MD  hydrOXYzine (ATARAX/VISTARIL) 25 MG tablet Take 1 tablet (25 mg total) by mouth every 8 (eight) hours as needed. Patient taking differently: Take 25 mg by mouth in the morning and at bedtime. 11/10/20   [name], MD  hydrOXYzine (ATARAX/VISTARIL) 50 MG tablet Take 50 mg by mouth daily as needed for anxiety (for agitation).    Holmes, Historical, MD  lamoTRIgine (LAMICTAL) 100 MG tablet Take 100 mg by mouth at bedtime.    Holmes, Historical, MD  levocetirizine (XYZAL) 5 MG tablet Take one daily 08/31/21   Lind Covert, MD  levothyroxine (SYNTHROID) 75 MCG tablet Take 1 tablet (75 mcg total) by mouth daily before breakfast. 04/04/21   Chambliss, Jeb Levering, MD  lidocaine (LIDODERM) 5 % Place 1 patch onto the skin daily. Remove & Discard patch within 12 hours or as directed by MD 09/07/21   Teodora Medici, FNP  lithium carbonate (ESKALITH) 450 MG CR tablet Take 1 tablet (450 mg total) by mouth every 12 (twelve) hours. Patient taking differently: Take 450 mg by mouth in the morning and at bedtime. 11/10/14   Pucilowska, Wardell Honour, MD  Multiple Vitamins-Minerals (SENTRY SENIOR) TABS Take 1 tablet by mouth daily. 07/05/21   Lind Covert, MD  OLANZapine (ZYPREXA) 10 MG tablet Take 10 mg by mouth in the morning and at bedtime.    Holmes, Historical, MD  OLANZapine (ZYPREXA) 20 MG tablet  Take 1.5 tablets (30 mg total) by mouth at bedtime. Take 1 and 1/2 tablets by mouth at bedtime. Patient not taking: Reported on 07/30/2021 11/10/14   Pucilowska, Herma Ard B, MD  QUEtiapine (SEROQUEL) 100 MG tablet Take 100 mg by mouth at bedtime.    Holmes, Historical, MD  sodium chloride (SALINE MIST) 0.65 % nasal spray USE 2 SPRAYS IN EACH NOSTRIL 4 TIMES DAILY. Patient taking differently: Place 2 sprays into the nose 4 (four) times daily. 11/09/20   Lind Covert, MD  Throat Lozenges (COUGH DROPS MT) Use as directed 1 drop in the mouth or throat as needed  (cough drops).    Holmes, Historical, MD  traZODone (DESYREL) 100 MG tablet Take 100 mg by mouth at bedtime.    Holmes, Historical, MD      Allergies    Bee pollen, Bee venom, and Penicillins    Review of Systems   Review of Systems  Neurological:  Positive for weakness.    Physical Exam Updated Vital Signs BP 118/67   Pulse 98   Temp 100 F (37.8 C) (Oral)   Resp 18   SpO2 97%  Physical Exam Vitals and nursing note reviewed. Exam conducted with a chaperone present.  Constitutional:      Appearance: Normal appearance.  HENT:     Head: Normocephalic and atraumatic.  Eyes:     General: No scleral icterus.       Right eye: No discharge.        Left eye: No discharge.     Extraocular Movements: Extraocular movements intact.     Pupils: Pupils are equal, round, and reactive to light.  Cardiovascular:     Rate and Rhythm: Normal rate and regular rhythm.     Pulses: Normal pulses.     Heart sounds: Normal heart sounds.     No friction rub. No gallop.  Pulmonary:     Effort: Pulmonary effort is normal. No respiratory distress.     Breath sounds: Normal breath sounds.  Abdominal:     General: Abdomen is flat. Bowel sounds are normal. There is no distension.     Palpations: Abdomen is soft.     Tenderness: There is no abdominal tenderness.  Skin:    General: Skin is warm and dry.     Coloration: Skin is not jaundiced.  Neurological:     Mental Status: He is alert. Mental status is at baseline.     Coordination: Coordination normal.     ED Results / Procedures / Treatments   Labs (all labs ordered are listed, but only abnormal results are displayed) Labs Reviewed  RESP PANEL BY RT-PCR (RSV, FLU A&B, COVID)  RVPGX2 - Abnormal; Notable for the following components:      Result Value   SARS Coronavirus 2 by RT PCR POSITIVE (*)    All other components within normal limits  COMPREHENSIVE METABOLIC PANEL - Abnormal; Notable for the following components:   Glucose, Bld  106 (*)    AST 49 (*)    All other components within normal limits  CBC WITH DIFFERENTIAL/PLATELET - Abnormal; Notable for the following components:   RBC 3.95 (*)    Hemoglobin 12.9 (*)    HCT 38.6 (*)    Platelets 111 (*)    Lymphs Abs 0.5 (*)    All other components within normal limits  CULTURE, BLOOD (ROUTINE X 2)  CULTURE, BLOOD (ROUTINE X 2)  LACTIC ACID, PLASMA  LACTIC ACID, PLASMA  URINALYSIS,  ROUTINE W REFLEX MICROSCOPIC  PROTIME-INR  APTT    EKG None  Radiology DG Chest Port 1 View  Result Date: 08/04/2022 CLINICAL DATA:  Questionable sepsis - evaluate for abnormality EXAM: PORTABLE CHEST 1 VIEW COMPARISON:  Chest x-ray 01/14/2022. FINDINGS: Low lung volumes. No consolidation. No visible pleural effusions or pneumothorax. Cardiomediastinal silhouette is similar to prior and within normal limits. No acute osseous abnormality. IMPRESSION: No active disease. Electronically Signed   By: Margaretha Sheffield M.D.   On: 08/04/2022 15:09    Procedures Procedures    Medications Ordered in ED Medications  lactated ringers infusion (has no administration in time range)  lactated ringers bolus 1,000 mL (0 mLs Intravenous Stopped 08/04/22 1655)  acetaminophen (TYLENOL) tablet 1,000 mg (1,000 mg Oral Given 08/04/22 1451)    ED Course/ Medical Decision Making/ A&P                             Medical Decision Making Amount and/or Complexity of Data Reviewed Labs: ordered. Radiology: ordered. ECG/medicine tests: ordered.  Risk OTC drugs. Prescription drug management.   This is a 52 year old male presenting to the emergency department due to generalized weakness.  Please evaluate patient, chart review and image notes for medical guardian.  Laboratory workup shows normal lactic acid, no leukocytosis, COVID 19+, no gross electrolyte derangement.   CXR negative for acute process.  I ordered Tylenol for fever and lactated ringer bolus.  Clinically, patient is not septic.  I  suspect the tachycardia reflective from the fever.  Will discharge home with molnupiravir and have him follow-up with his primary.  Do not think any indication for admission given no hypoxia, stable vitals, reassuring laboratory workup without any indication of sepsis.        Final Clinical Impression(s) / ED Diagnoses Final diagnoses:  [id number]    Rx / DC Orders ED Discharge Orders          Ordered    molnupiravir EUA (LAGEVRIO) 200 mg CAPS capsule  2 times daily        08/04/22 1607              Sherrill Raring, PA-C 08/04/22 2017    Gareth Morgan, MD 08/04/22 2334

## 2022-08-04 NOTE — Discharge Instructions (Signed)
You are seen today in the emergency department due to weakness.  You have COVID-19, this is a virus which will pass on its own.  Your workup today was reassuring, you are not septic and there is no signs of endorgan damage.  Drink plenty of fluids, you can take the molnupiravir as prescribed as an antiviral that may help improve viral symptoms.  Take Tylenol as needed for fever.  Return to the ED for new or concerning symptoms.

## 2022-08-04 NOTE — ED Notes (Signed)
Caregiver robbie called for pick up. Will be here in 15 minutes

## 2022-08-04 NOTE — ED Triage Notes (Signed)
Pt BIBA from urgent care with c/o weakness from being COVID positive.   BP 118/78 HR 107 RR 18 98% RA T 99.8

## 2022-08-09 LAB — CULTURE, BLOOD (ROUTINE X 2)
Culture: NO GROWTH
Culture: NO GROWTH

## 2022-09-16 ENCOUNTER — Other Ambulatory Visit: Payer: Self-pay

## 2022-09-16 ENCOUNTER — Emergency Department (HOSPITAL_COMMUNITY)
Admission: EM | Admit: 2022-09-16 | Discharge: 2022-09-17 | Disposition: A | Discharge status: Home / Self Care | Payer: Medicaid Other | Attending: Emergency Medicine | Admitting: Emergency Medicine

## 2022-09-16 ENCOUNTER — Encounter (HOSPITAL_COMMUNITY): Payer: Self-pay | Admitting: *Deleted

## 2022-09-16 DIAGNOSIS — R461 Bizarre personal appearance: Secondary | ICD-10-CM | POA: Diagnosis present

## 2022-09-16 DIAGNOSIS — Z8659 Personal history of other mental and behavioral disorders: Secondary | ICD-10-CM | POA: Diagnosis not present

## 2022-09-16 DIAGNOSIS — X58XXXA Exposure to other specified factors, initial encounter: Secondary | ICD-10-CM | POA: Insufficient documentation

## 2022-09-16 DIAGNOSIS — F70 Mild intellectual disabilities: Secondary | ICD-10-CM | POA: Diagnosis present

## 2022-09-16 DIAGNOSIS — Z1152 Encounter for screening for COVID-19: Secondary | ICD-10-CM | POA: Insufficient documentation

## 2022-09-16 DIAGNOSIS — J45909 Unspecified asthma, uncomplicated: Secondary | ICD-10-CM | POA: Insufficient documentation

## 2022-09-16 DIAGNOSIS — S50811A Abrasion of right forearm, initial encounter: Secondary | ICD-10-CM | POA: Insufficient documentation

## 2022-09-16 DIAGNOSIS — R462 Strange and inexplicable behavior: Secondary | ICD-10-CM

## 2022-09-16 DIAGNOSIS — S50812A Abrasion of left forearm, initial encounter: Secondary | ICD-10-CM | POA: Insufficient documentation

## 2022-09-16 LAB — COMPREHENSIVE METABOLIC PANEL
ALT: 22 U/L (ref 0–44)
AST: 23 U/L (ref 15–41)
Albumin: 3.7 g/dL (ref 3.5–5.0)
Alkaline Phosphatase: 75 U/L (ref 38–126)
Anion gap: 9 (ref 5–15)
BUN: 13 mg/dL (ref 6–20)
CO2: 21 mmol/L — ABNORMAL LOW (ref 22–32)
Calcium: 9.7 mg/dL (ref 8.9–10.3)
Chloride: 111 mmol/L (ref 98–111)
Creatinine, Ser: 1.12 mg/dL (ref 0.61–1.24)
GFR, Estimated: >60 mL/min (ref >60)
Glucose, Bld: 126 mg/dL — ABNORMAL HIGH (ref 70–99)
Potassium: 4.2 mmol/L (ref 3.5–5.1)
Sodium: 141 mmol/L (ref 135–145)
Total Bilirubin: 0.5 mg/dL (ref 0.3–1.2)
Total Protein: 6.4 g/dL — ABNORMAL LOW (ref 6.5–8.1)

## 2022-09-16 LAB — CBC
HCT: 35.9 % — ABNORMAL LOW (ref 39.0–52.0)
Hemoglobin: 11.8 g/dL — ABNORMAL LOW (ref 13.0–17.0)
MCH: 32.2 pg (ref 26.0–34.0)
MCHC: 32.9 g/dL (ref 30.0–36.0)
MCV: 98.1 fL (ref 80.0–100.0)
Platelets: 148 10*3/uL — ABNORMAL LOW (ref 150–400)
RBC: 3.66 MIL/uL — ABNORMAL LOW (ref 4.22–5.81)
RDW: 12.9 % (ref 11.5–15.5)
WBC: 5.6 10*3/uL (ref 4.0–10.5)
nRBC: 0 % (ref 0.0–0.2)

## 2022-09-16 LAB — RAPID URINE DRUG SCREEN, HOSP PERFORMED
Amphetamines: NOT DETECTED
Barbiturates: NOT DETECTED
Benzodiazepines: NOT DETECTED
Cocaine: NOT DETECTED
Opiates: NOT DETECTED
Tetrahydrocannabinol: NOT DETECTED

## 2022-09-16 LAB — SALICYLATE LEVEL: Salicylate Lvl: <7 mg/dL — ABNORMAL LOW (ref 7.0–30.0)

## 2022-09-16 LAB — ETHANOL: Alcohol, Ethyl (B): <10 mg/dL (ref <10)

## 2022-09-16 LAB — ACETAMINOPHEN LEVEL: Acetaminophen (Tylenol), Serum: <10 ug/mL — ABNORMAL LOW (ref 10–30)

## 2022-09-16 NOTE — ED Provider Notes (Signed)
[place] [facility]ERGENCY DEPARTMENT AT [facility] Provider Note   CSN: [id number] Arrival date & time: 09/16/22  1954     History {Add pertinent medical, surgical, social history, OB history to HPI:1} Chief Complaint  Patient presents with   Psychiatric Evaluation    Daniel Holmes is a 52 y.o. male.  52 y/o male with hx of psychosis, asthma, bipolar 1 disorder, mental delay presents to the emergency department under IVC taken out by caregiver.  He resides at a group home.  Reportedly went to a wedding with his caregiver today and became disruptive.  When he was back home he began scratching his arms and walking in and out of parked cars in an attempt to harm himself.  He reports that he has been taking his psychiatric medications.  He does not feel they are working.  He does endorse thoughts of self-harm.  Denies drug and alcohol use.  The history is provided by the patient. No language interpreter was used.       Home Medications Prior to Admission medications   Medication Sig Start Date End Date Taking? Authorizing Provider  acetaminophen (TYLENOL) 325 MG tablet Take 650 mg by mouth every 6 (six) hours as needed for moderate pain or headache.    [provider]  benzonatate (TESSALON) 100 MG capsule Take 1 capsule (100 mg total) by mouth every 8 (eight) hours. 01/14/22   Kommor, Madison, MD  cetirizine (ZYRTEC) 10 MG tablet TAKE 1 TABLET BY MOUTH DAILY. Patient taking differently: Take 10 mg by mouth in the morning. 02/28/21   Carney Living, MD  cloNIDine (CATAPRES) 0.1 MG tablet Take 0.1 mg by mouth in the morning and at bedtime.    [provider]  cloZAPine (CLOZARIL) 25 MG tablet Take 25 mg by mouth at bedtime. 08/12/21   [provider]  divalproex (DEPAKOTE) 250 MG DR tablet Take by mouth daily. 11/28/21   [provider]  guaiFENesin (TUSSIN PO) Take 20 mLs by mouth every 6 (six) hours as needed (cough).    [provider]  hydrOXYzine (ATARAX/VISTARIL) 25 MG tablet Take 1 tablet (25 mg total) by mouth every 8 (eight) hours as needed. Patient taking differently: Take 25 mg by mouth in the morning and at bedtime. 11/10/20   Cathren Laine, MD  hydrOXYzine (ATARAX/VISTARIL) 50 MG tablet Take 50 mg by mouth daily as needed for anxiety (for agitation).    [provider]  lamoTRIgine (LAMICTAL) 100 MG tablet Take 100 mg by mouth at bedtime.    [provider]  levocetirizine (XYZAL) 5 MG tablet Take one daily 08/31/21   Carney Living, MD  levothyroxine (SYNTHROID) 75 MCG tablet Take 1 tablet (75 mcg total) by mouth daily before breakfast. 04/04/21   Chambliss, Estill Batten, MD  lidocaine (LIDODERM) 5 % Place 1 patch onto the skin daily. Remove & Discard patch within 12 hours or as directed by MD 09/07/21   Gustavus Bryant, FNP  lithium carbonate (ESKALITH) 450 MG CR tablet Take 1 tablet (450 mg total) by mouth every 12 (twelve) hours. Patient taking differently: Take 450 mg by mouth in the morning and at bedtime. 11/10/14   Pucilowska, Ellin Goodie, MD  Multiple Vitamins-Minerals (SENTRY SENIOR) TABS Take 1 tablet by mouth daily. 07/05/21   Carney Living, MD  OLANZapine (ZYPREXA) 10 MG tablet Take 10 mg by mouth in the morning and at bedtime.    [provider]  OLANZapine (ZYPREXA) 20  MG tablet Take 1.5 tablets (30 mg total) by mouth at bedtime. Take 1 and 1/2 tablets by mouth at bedtime. Patient not taking: Reported on 07/30/2021 11/10/14   Pucilowska, Braulio Conte B, MD  QUEtiapine (SEROQUEL) 100 MG tablet Take 100 mg by mouth at bedtime.    [provider]  sodium chloride (SALINE MIST) 0.65 % nasal spray USE 2 SPRAYS IN EACH NOSTRIL 4 TIMES DAILY. Patient taking differently: Place 2 sprays into the nose 4 (four) times daily. 11/09/20   Carney Living, MD  Throat Lozenges (COUGH DROPS MT) Use as directed 1 drop in the mouth or throat as needed (cough drops).     [provider]  traZODone (DESYREL) 100 MG tablet Take 100 mg by mouth at bedtime.    [provider]      Allergies    Bee pollen, Bee venom, and Penicillins    Review of Systems   Review of Systems Ten systems reviewed and are negative for acute change, except as noted in the HPI.    Physical Exam Updated Vital Signs BP 118/86 (BP Location: Right Arm)   Pulse (!) 109   Temp 98.4 F (36.9 C) (Oral)   Resp 17   Ht  (1.6 m)   Wt 74.8 kg   SpO2 97%   BMI 29.21 kg/m   Physical Exam Vitals and nursing note reviewed.  Constitutional:      General: He is not in acute distress.    Appearance: He is well-developed. He is not diaphoretic.     Comments: Speech impediment. Nontoxic appearing.  HENT:     Head: Normocephalic and atraumatic.  Eyes:     General: No scleral icterus.    Conjunctiva/sclera: Conjunctivae normal.  Pulmonary:     Effort: Pulmonary effort is normal. No respiratory distress.  Musculoskeletal:        General: Normal range of motion.     Cervical back: Normal range of motion.  Skin:    General: Skin is warm and dry.     Coloration: Skin is not pale.     Findings: No rash.     Comments: Abrasions to volar aspect of b/l forearms  Neurological:     Mental Status: He is alert and oriented to person, place, and time.  Psychiatric:        Behavior: Behavior is cooperative.     ED Results / Procedures / Treatments   Labs (all labs ordered are listed, but only abnormal results are displayed) Labs Reviewed  COMPREHENSIVE METABOLIC PANEL - Abnormal; Notable for the following components:      Result Value   CO2 21 (*)    Glucose, Bld 126 (*)    Total Protein 6.4 (*)    All other components within normal limits  SALICYLATE LEVEL - Abnormal; Notable for the following components:   Salicylate Lvl <7.0 (*)    All other components within normal limits  ACETAMINOPHEN LEVEL - Abnormal; Notable for the following components:    Acetaminophen (Tylenol), Serum <10 (*)    All other components within normal limits  CBC - Abnormal; Notable for the following components:   RBC 3.66 (*)    Hemoglobin 11.8 (*)    HCT 35.9 (*)    Platelets 148 (*)    All other components within normal limits  ETHANOL  RAPID URINE DRUG SCREEN, HOSP PERFORMED    EKG None  Radiology No results found.  Procedures Procedures  {Document cardiac monitor, telemetry assessment  procedure when appropriate:1}  Medications Ordered in ED Medications - No data to display  ED Course/ Medical Decision Making/ A&P   {   Click here for ABCD2, HEART and other calculatorsREFRESH Note before signing :1}                          Medical Decision Making  ***  {Document critical care time when appropriate:1} {Document review of labs and clinical decision tools ie heart score, Chads2Vasc2 etc:1}  {Document your independent review of radiology images, and any outside records:1} {Document your discussion with family members, caretakers, and with consultants:1} {Document social determinants of health affecting pt's care:1} {Document your decision making why or why not admission, treatments were needed:1} Final Clinical Impression(s) / ED Diagnoses Final diagnoses:  None    Rx / DC Orders ED Discharge Orders     None

## 2022-09-16 NOTE — ED Notes (Signed)
Per [employer] officer: Pt is from a group home. Today went to a wedding with his caregiver and became disruptive. Once pt was taken back home, he started scratching his arms then was walking in and out of parked cars in attempts to harm himself. IVC paperwork was taken out by caregiver.   Pt is dressed out in burgundy scrubs. PA at bedside

## 2022-09-16 NOTE — ED Notes (Signed)
[phone] stepmother called wanting a update

## 2022-09-16 NOTE — ED Notes (Signed)
Superficial scratch marks from nails noted to bilateral forearms. Pt upset about potentially having to go back to the group home ( group home address [id number])

## 2022-09-16 NOTE — ED Triage Notes (Signed)
I'm not sure why this pt is hear he has a speech problem  and I think he says he wants to kill himself ?????

## 2022-09-17 LAB — RESP PANEL BY RT-PCR (RSV, FLU A&B, COVID)  RVPGX2
Influenza A by PCR: NEGATIVE
Influenza B by PCR: NEGATIVE
Resp Syncytial Virus by PCR: NEGATIVE
SARS Coronavirus 2 by RT PCR: NEGATIVE

## 2022-09-17 MED ORDER — ACETAMINOPHEN 500 MG PO TABS
1000.0000 mg | ORAL_TABLET | Freq: Once | ORAL | Status: AC
  Administered 2022-09-17: 1000 mg via ORAL
  Filled 2022-09-17: qty 2

## 2022-09-17 NOTE — ED Notes (Signed)
IVC paperwork complete and in purple zone, copies sent to [facility], original in red folder, expires 09/23/22

## 2022-09-17 NOTE — ED Provider Notes (Signed)
Emergency Medicine Observation Re-evaluation Note  Daniel Holmes is a 52 y.o. male with a history of bipolar and psychosis, seen on rounds today.  Pt initially presented to the ED for complaints of Psychiatric Evaluation after walking out in front of cars Currently, the patient is resting comfortably without complaints.  Denies any SI, HI, or AVH at this time.  Physical Exam  BP 104/67 (BP Location: Left Arm)   Pulse 88   Temp 98.1 F (36.7 C) (Oral)   Resp 18   Ht  (1.6 m)   Wt 74.8 kg   SpO2 100%   BMI 29.21 kg/m  Physical Exam General: Alert and conversant Lungs: Normal work of breathing Psych: Calm and cooperative  ED Course / MDM  EKG:   I have reviewed the labs performed to date as well as medications administered while in observation.  Recent changes in the last 24 hours include IVC paperwork filled out.  Assessed by TTS this morning who will sign off and reports that he is at his psychiatric baseline.  Plan  Current plan is for discharge with outpatient psychiatry follow-up.  Resources for [employer] given in his discharge instructions.  Discussed with Wynelle Beckmann who is the patient's caretaker who will accept him back to his facility at this time.    Rondel Baton, MD 09/17/22 [phone]

## 2022-09-17 NOTE — Discharge Instructions (Signed)
You were seen for your behavior in the emergency department.  The psychiatry team cleared you and would like for you to follow-up with [facility] services as an outpatient for additional therapy.  Check your MyChart online for the results of any tests that had not resulted by the time you left the emergency department.   Follow-up with your primary doctor in 2-3 days regarding your visit.    Return immediately to the emergency department if you experience any of the following: Thoughts of harming yourself, thoughts of harming others, hallucinations, or any other concerning symptoms.    Thank you for visiting our Emergency Department. It was a pleasure taking care of you today.

## 2022-09-17 NOTE — BH Assessment (Signed)
Pt is currently somnolent and will be assessed when he is able to participate.   Pamalee Leyden, [facility], [facility] Triage Specialist [phone]

## 2022-09-17 NOTE — Consult Note (Signed)
[facility] ED ASSESSMENT   Reason for Consult:  Psych consult Referring Physician:  EDP Patient Identification: Daniel Holmes MRN:  [id number] ED Chief Complaint: Mild intellectual disability  Diagnosis:  Principal Problem:   Mild intellectual disability   ED Assessment Time Calculation: Start Time: 1000 Stop Time: 1045 Total Time in Minutes (Assessment Completion): 45   Subjective:   Daniel Holmes is a 52 y.o. male patient admitted with self harm and thoughts of suicide attempt.   Patient seen and reassessed by this nurse practitioner. He appears to be stable to discharge and return to his group home.   On evaluation patient is alert and oriented, calm and cooperative, very pleasant upon approach.  Patient denies any access to weapons, denies any alcohol and or substance abuse.  He reports moderate sleep and fair appetite.  He is [facility], Dr. Jannifer Franklin , and his ALF is available to assist with medication management in which he reports compliance with most of his appointments.  Patient denies any auditory and/or visual hallucinations, does not appear to be responding to internal or external stimuli.  There is no evidence of delusional thought content and patient appears to answer all questions appropriately.  At this time patient appears to be stable to discharge home, with support system services in place.  He is able to contract for safety.   HPI:  Pt is from a group home. Today went to a wedding with his caregiver and became disruptive. Once pt was taken back home, he started scratching his arms then was walking in and out of parked cars in attempts to harm himself. IVC paperwork was taken out by caregiver.   Past Psychiatric History:   Risk to Self or Others: Is the patient at risk to self? No Has the patient been a risk to self in the past 6 months? No Has the patient been a risk to self within the distant past? Yes Is the patient a risk  to others? No Has the patient been a risk to others in the past 6 months? No Has the patient been a risk to others within the distant past? No  [place] Scale:  Flowsheet Row ED from 09/16/2022 in [facility] Emergency Department at [facility] ED from 08/04/2022 in [facility] Emergency Department at [facility] ED from 01/14/2022 in [facility] Emergency Department at [facility]  C-SSRS RISK CATEGORY High Risk No Risk No Risk       AIMS:  , , ,  ,   ASAM:    Substance Abuse:     Past Medical History:  Past Medical History:  Diagnosis Date   Allergy    Asthma    Bipolar 1 disorder    Mental developmental delay    Psychosis    See [facility]    Past Surgical History:  Procedure Laterality Date   HERNIA REPAIR     Family History:  Family History  Problem Relation Age of Onset   Colon cancer Neg Hx    Esophageal cancer Neg Hx    Stomach cancer Neg Hx    Rectal cancer Neg Hx    Family Psychiatric  History:  Social History:  Social History   Substance and Sexual Activity  Alcohol Use No     Social History   Substance and Sexual Activity  Drug Use No    Social History   Socioeconomic History   Marital status:  Single    Spouse name: Not on file   Number of children: Not on file   Years of education: Not on file   Highest education level: Not on file  Occupational History   Not on file  Tobacco Use   Smoking status: Former    Packs/day: 1.00    Years: 10.00    Additional pack years: 0.00    Total pack years: 10.00    Types: Cigarettes    Quit date: 05/29/1998    Years since quitting: 24.3   Smokeless tobacco: Never  Vaping Use   Vaping Use: Never used  Substance and Sexual Activity   Alcohol use: No   Drug use: No   Sexual activity: Never  Other Topics Concern   Not on file  Social History Narrative   Lives in [facility].   Sees his father regularly   Social  Determinants of Health   Financial Resource Strain: Not on file  Food Insecurity: Not on file  Transportation Needs: Not on file  Physical Activity: Not on file  Stress: Not on file  Social Connections: Not on file   Additional Social History:    Allergies:   Allergies  Allergen Reactions   Bee Pollen Anaphylaxis   Bee Venom Anaphylaxis, Swelling and Other (See Comments)    Body swells   Penicillins Anaphylaxis    Childhood ALLERGY Has patient had a PCN reaction causing immediate rash, facial/tongue/throat swelling, SOB or lightheadedness with hypotension: Yes Has patient had a PCN reaction causing severe rash involving mucus membranes or skin necrosis: No Has patient had a PCN reaction that required hospitalization No Has patient had a PCN reaction occurring within the last 10 years: No If all of the above answers are "NO", then may proceed with Cephalosporin use.     Labs:  Results for orders placed or performed during the hospital encounter of 09/16/22 (from the past 48 hour(s))  Comprehensive metabolic panel     Status: Abnormal   Collection Time: 09/16/22  9:24 PM  Result Value Ref Range   Sodium 141 135 - 145 mmol/L   Potassium 4.2 3.5 - 5.1 mmol/L   Chloride 111 98 - 111 mmol/L   CO2 21 (L) 22 - 32 mmol/L   Glucose, Bld 126 (H) 70 - 99 mg/dL    Comment: Glucose reference range applies only to samples taken after fasting for at least 8 hours.   BUN 13 6 - 20 mg/dL   Creatinine, Ser 4.09 0.61 - 1.24 mg/dL   Calcium 9.7 8.9 - 81.1 mg/dL   Total Protein 6.4 (L) 6.5 - 8.1 g/dL   Albumin 3.7 3.5 - 5.0 g/dL   AST 23 15 - 41 U/L   ALT 22 0 - 44 U/L   Alkaline Phosphatase 75 38 - 126 U/L   Total Bilirubin 0.5 0.3 - 1.2 mg/dL   GFR, Estimated >91 >47 mL/min    Comment: (NOTE) Calculated using the CKD-EPI Creatinine Equation (2021)    Anion gap 9 5 - 15    Comment: Performed at [facility], [address] [place], [place] [address]  Ethanol     Status: None    Collection Time: 09/16/22  9:24 PM  Result Value Ref Range   Alcohol, Ethyl (B) <10 <10 mg/dL    Comment: (NOTE) Lowest detectable limit for serum alcohol is 10 mg/dL.  For medical purposes only. Performed at [facility], [address] [place], [place] [address]   Salicylate level     Status: Abnormal   Collection Time: 09/16/22  9:24 PM  Result Value Ref Range   Salicylate Lvl <7.0 (L) 7.0 - 30.0 mg/dL    Comment: Performed at [facility], [address], [place], [place] [address]  Acetaminophen level     Status: Abnormal   Collection Time: 09/16/22  9:24 PM  Result Value Ref Range   Acetaminophen (Tylenol), Serum <10 (L) 10 - 30 ug/mL    Comment: (NOTE) Therapeutic concentrations vary significantly. A range of 10-30 ug/mL  may be an effective concentration for many patients. However, some  are best treated at concentrations outside of this range. Acetaminophen concentrations >150 ug/mL at 4 hours after ingestion  and >50 ug/mL at 12 hours after ingestion are often associated with  toxic reactions.  Performed at [facility], [address] [place], [place] [address]   cbc     Status: Abnormal   Collection Time: 09/16/22  9:24 PM  Result Value Ref Range   WBC 5.6 4.0 - 10.5 K/uL   RBC 3.66 (L) 4.22 - 5.81 MIL/uL   Hemoglobin 11.8 (L) 13.0 - 17.0 g/dL   HCT 91.4 (L) 78.2 - 95.6 %   MCV 98.1 80.0 - 100.0 fL   MCH 32.2 26.0 - 34.0 pg   MCHC 32.9 30.0 - 36.0 g/dL   RDW 21.3 08.6 - 57.8 %   Platelets 148 (L) 150 - 400 K/uL   nRBC 0.0 0.0 - 0.2 %    Comment: Performed at [facility], [address], [place], [place] [address]  Rapid urine drug screen (hospital performed)     Status: None   Collection Time: 09/16/22  9:26 PM  Result Value Ref Range   Opiates NONE DETECTED NONE DETECTED   Cocaine NONE DETECTED NONE DETECTED   Benzodiazepines NONE DETECTED NONE DETECTED   Amphetamines NONE DETECTED NONE DETECTED   Tetrahydrocannabinol NONE  DETECTED NONE DETECTED   Barbiturates NONE DETECTED NONE DETECTED    Comment: (NOTE) DRUG SCREEN FOR MEDICAL PURPOSES ONLY.  IF CONFIRMATION IS NEEDED FOR ANY PURPOSE, NOTIFY LAB WITHIN 5 DAYS.  LOWEST DETECTABLE LIMITS FOR URINE DRUG SCREEN Drug Class                     Cutoff (ng/mL) Amphetamine and metabolites    1000 Barbiturate and metabolites    200 Benzodiazepine                 200 Opiates and metabolites        300 Cocaine and metabolites        300 THC                            50 Performed at [facility], [address] [place], [place] [address]   Resp panel by RT-PCR (RSV, Flu A&B, Covid) Anterior Nasal Swab     Status: None   Collection Time: 09/17/22  6:32 AM   Specimen: Anterior Nasal Swab  Result Value Ref Range   SARS Coronavirus 2 by RT PCR NEGATIVE NEGATIVE   Influenza A by PCR NEGATIVE NEGATIVE   Influenza B by PCR NEGATIVE NEGATIVE    Comment: (NOTE) The Xpert Xpress SARS-CoV-2/FLU/RSV plus assay is intended as an aid in the diagnosis of influenza from Nasopharyngeal swab specimens and should not be used as  a sole basis for treatment. Nasal washings and aspirates are unacceptable for Xpert Xpress SARS-CoV-2/FLU/RSV testing.  Fact Sheet for Patients: BloggerCourse.com  Fact Sheet for Healthcare Providers: SeriousBroker.it  This test is not yet approved or cleared by the [place] FDA and has been authorized for detection and/or diagnosis of SARS-CoV-2 by FDA under an Emergency Use Authorization (EUA). This EUA will remain in effect (meaning this test can be used) for the duration of the COVID-19 declaration under Section 564(b)(1) of the Act, 21 U.S.C. section 360bbb-3(b)(1), unless the authorization is terminated or revoked.     Resp Syncytial Virus by PCR NEGATIVE NEGATIVE    Comment: (NOTE) Fact Sheet for Patients: BloggerCourse.com  Fact Sheet for  Healthcare Providers: SeriousBroker.it  This test is not yet approved or cleared by the [place] FDA and has been authorized for detection and/or diagnosis of SARS-CoV-2 by FDA under an Emergency Use Authorization (EUA). This EUA will remain in effect (meaning this test can be used) for the duration of the COVID-19 declaration under Section 564(b)(1) of the Act, 21 U.S.C. section 360bbb-3(b)(1), unless the authorization is terminated or revoked.  Performed at [facility], [address], [place], [place] [address]     No current facility-administered medications for this encounter.   Current Outpatient Medications  Medication Sig Dispense Refill   acetaminophen (TYLENOL) 325 MG tablet Take 650 mg by mouth every 6 (six) hours as needed for moderate pain or headache.     benzonatate (TESSALON) 100 MG capsule Take 1 capsule (100 mg total) by mouth every 8 (eight) hours. 21 capsule 0   cetirizine (ZYRTEC) 10 MG tablet TAKE 1 TABLET BY MOUTH DAILY. (Patient taking differently: Take 10 mg by mouth in the morning.) 30 tablet 6   cloNIDine (CATAPRES) 0.1 MG tablet Take 0.1 mg by mouth in the morning and at bedtime.     cloZAPine (CLOZARIL) 25 MG tablet Take 25 mg by mouth at bedtime.     divalproex (DEPAKOTE) 250 MG DR tablet Take by mouth daily.     guaiFENesin (TUSSIN PO) Take 20 mLs by mouth every 6 (six) hours as needed (cough).     hydrOXYzine (ATARAX/VISTARIL) 25 MG tablet Take 1 tablet (25 mg total) by mouth every 8 (eight) hours as needed. (Patient taking differently: Take 25 mg by mouth in the morning and at bedtime.) 15 tablet 0   hydrOXYzine (ATARAX/VISTARIL) 50 MG tablet Take 50 mg by mouth daily as needed for anxiety (for agitation).     lamoTRIgine (LAMICTAL) 100 MG tablet Take 100 mg by mouth at bedtime.     levocetirizine (XYZAL) 5 MG tablet Take one daily 30 tablet 3   levothyroxine (SYNTHROID) 75 MCG tablet Take 1 tablet (75 mcg total) by  mouth daily before breakfast. 90 tablet 0   lidocaine (LIDODERM) 5 % Place 1 patch onto the skin daily. Remove & Discard patch within 12 hours or as directed by MD 30 patch 0   lithium carbonate (ESKALITH) 450 MG CR tablet Take 1 tablet (450 mg total) by mouth every 12 (twelve) hours. (Patient taking differently: Take 450 mg by mouth in the morning and at bedtime.) 60 tablet 0   Multiple Vitamins-Minerals (SENTRY SENIOR) TABS Take 1 tablet by mouth daily. 30 tablet 11   OLANZapine (ZYPREXA) 10 MG tablet Take 10 mg by mouth in the morning and at bedtime.     OLANZapine (ZYPREXA) 20 MG tablet Take 1.5 tablets (30 mg total) by mouth at  bedtime. Take 1 and 1/2 tablets by mouth at bedtime. (Patient not taking: Reported on 07/30/2021) 45 tablet 0   QUEtiapine (SEROQUEL) 100 MG tablet Take 100 mg by mouth at bedtime.     sodium chloride (SALINE MIST) 0.65 % nasal spray USE 2 SPRAYS IN EACH NOSTRIL 4 TIMES DAILY. (Patient taking differently: Place 2 sprays into the nose 4 (four) times daily.) 45 mL 11   Throat Lozenges (COUGH DROPS MT) Use as directed 1 drop in the mouth or throat as needed (cough drops).     traZODone (DESYREL) 100 MG tablet Take 100 mg by mouth at bedtime.      Musculoskeletal: Strength & Muscle Tone: within normal limits Gait & Station: normal Patient leans: N/A   Psychiatric Specialty Exam: Presentation  General Appearance:  Appropriate for Environment; Casual  Eye Contact: Fair  Speech: Garbled  Speech Volume: Normal  Handedness: Right   Mood and Affect  Mood: Euthymic  Affect: Appropriate; Congruent   Thought Process  Thought Processes: Coherent; Linear  Descriptions of Associations:Intact  Orientation:Full (Time, Place and Person)  Thought Content:Logical  History of Schizophrenia/Schizoaffective disorder:No data recorded Duration of Psychotic Symptoms:No data recorded Hallucinations:Hallucinations: None  Ideas of Reference:None  Suicidal  Thoughts:Suicidal Thoughts: No  Homicidal Thoughts:Homicidal Thoughts: No   Sensorium  Memory: Immediate Fair; Recent Fair; Remote Fair  Judgment: Fair  Insight: Fair   Art therapist  Concentration: Fair  Attention Span: Fair  Recall: Fair  Fund of Knowledge: Fair  Language: Poor   Psychomotor Activity  Psychomotor Activity: Psychomotor Activity: Normal   Assets  Assets: Desire for Improvement; Financial Resources/Insurance; Housing; Leisure Time    Sleep  Sleep: Sleep: Fair   Physical Exam: Physical Exam Vitals and nursing note reviewed.  Constitutional:      Appearance: Normal appearance. He is normal weight.  HENT:     Head: Normocephalic.  Skin:    Capillary Refill: Capillary refill takes less than 2 seconds.  Neurological:     Mental Status: He is alert and oriented to person, place, and time. Mental status is at baseline.  Psychiatric:        Attention and Perception: Attention and perception normal.        Mood and Affect: Mood and affect normal.        Behavior: Behavior normal.        Thought Content: Thought content normal.        Judgment: Judgment normal.    Review of Systems  Psychiatric/Behavioral:  Negative for depression, hallucinations, memory loss, substance abuse and suicidal ideas (denies today). The patient is not nervous/anxious and does not have insomnia.    Blood pressure 104/67, pulse 88, temperature 98.1 F (36.7 C), temperature source Oral, resp. rate 18, height  (1.6 m), weight 74.8 kg, SpO2 100 %. Body mass index is 29.21 kg/m.  Medical Decision Making: 52 year old male mental mental delayHistory of, bipolar disorder who presents for psych evaluation, secondary to suicide/self-harm.  Patient currently denies suicidal thoughts, homicidal ideations and hallucinations.  Patient has a Child psychotherapist, IDD Gaffer, and currently resides in a group home.  Patient requests social work visit and new  one-to-one.  He states that he is no longer upset, and ready to return back to his group home.  Patient has been cooperative, compliant, and calm.  Will recommend we continue his current medications and follow-up with his current outpatient providers.  Consider adjusting his behavioral health plan, to include increasing level of services for  one-to-one or CST support.  At this time patient does appear to be at his psychiatric baseline, and is stable to return to group home.  Problem 1: IDD-continue current medications. -Consider TOC for disposition and return home. -Recommend increasing level of services to include community support team/one-to-one MST.    Problem 2: Self-harm-patient now denies suicidal thoughts.  Requesting to return home, and is able to contract for safety.  TTS consult service to sign off at this time.  As noted patient appears to be at his psychiatric baseline, no imminent or acute danger to self or others, and is stable to return to group home.  Will communicate the above findings to TTS.   Disposition: No evidence of imminent risk to self or others at present.   Patient does not meet criteria for psychiatric inpatient admission. Supportive therapy provided about ongoing stressors. Discussed crisis plan, support from social network, calling 911, coming to the Emergency Department, and calling Suicide Hotline.  Maryagnes Amos, FNP 09/17/2022 10:49 AM

## 2022-11-19 ENCOUNTER — Encounter (HOSPITAL_COMMUNITY): Payer: Self-pay | Admitting: Emergency Medicine

## 2022-11-19 ENCOUNTER — Other Ambulatory Visit: Payer: Self-pay

## 2022-11-19 ENCOUNTER — Emergency Department (HOSPITAL_COMMUNITY)
Admission: EM | Admit: 2022-11-19 | Discharge: 2022-11-19 | Disposition: A | Discharge status: Home / Self Care | Payer: Medicaid Other | Attending: Emergency Medicine | Admitting: Emergency Medicine

## 2022-11-19 DIAGNOSIS — R4689 Other symptoms and signs involving appearance and behavior: Secondary | ICD-10-CM | POA: Diagnosis present

## 2022-11-19 NOTE — ED Triage Notes (Signed)
Pt dropped off by caregiver, Gaynelle Adu. Attempted to call with no answer. RN called stepmom who reports she is pts legal guardian for now but "not for long and he doesn't live with me." States she believes he ran away today.  Robbie called RN back and states that every 2-3 months, pt will run away and say he wants a new group home. Police came to see pt and refused to bring pt in. Gaynelle Adu states that pt takes his meds regularly.

## 2022-11-19 NOTE — ED Notes (Signed)
Daniel Holmes from group home states she is on way to pick up pt

## 2022-11-19 NOTE — ED Provider Notes (Signed)
[place] EMERGENCY DEPARTMENT AT [facility] Provider Note   CSN: [id number] Arrival date & time: 11/19/22  1738     History Chief Complaint  Patient presents with   Mental Health Problem    HPI Daniel Holmes is a 52 y.o. male presenting for aggressive behavior from his group home.  Per emergency services patient was aggressive at his group home, security was called and they recommended come to the emergency department evaluation.  Nursing called patient's legal guardian who had no further collateral and called the group home supervisor who stated that when he was calm she was happy to come pick him back up. Patient has no complaints sitting calmly in bed asking when he he can go back to his facility.   Patient's recorded medical, surgical, social, medication list and allergies were reviewed in the Snapshot window as part of the initial history.   Review of Systems   Review of Systems  Constitutional:  Negative for chills and fever.  HENT:  Negative for ear pain and sore throat.   Eyes:  Negative for pain and visual disturbance.  Respiratory:  Negative for cough and shortness of breath.   Cardiovascular:  Negative for chest pain and palpitations.  Gastrointestinal:  Negative for abdominal pain and vomiting.  Genitourinary:  Negative for dysuria and hematuria.  Musculoskeletal:  Negative for arthralgias and back pain.  Skin:  Negative for color change and rash.  Neurological:  Negative for seizures and syncope.  All other systems reviewed and are negative.   Physical Exam Updated Vital Signs BP 102/68 (BP Location: Left Arm)   Pulse 98   Temp 98.2 F (36.8 C) (Oral)   Resp 16   SpO2 95%  Physical Exam Vitals and nursing note reviewed.  Constitutional:      General: He is not in acute distress.    Appearance: He is well-developed.  HENT:     Head: Normocephalic and atraumatic.  Eyes:     Conjunctiva/sclera: Conjunctivae normal.  Cardiovascular:      Rate and Rhythm: Normal rate and regular rhythm.     Heart sounds: No murmur heard. Pulmonary:     Effort: Pulmonary effort is normal. No respiratory distress.     Breath sounds: Normal breath sounds.  Abdominal:     Palpations: Abdomen is soft.     Tenderness: There is no abdominal tenderness.  Musculoskeletal:        General: No swelling.     Cervical back: Neck supple.  Skin:    General: Skin is warm and dry.     Capillary Refill: Capillary refill takes less than 2 seconds.  Neurological:     Mental Status: He is alert.  Psychiatric:        Mood and Affect: Mood normal.      ED Course/ Medical Decision Making/ A&P    Procedures Procedures   Medications Ordered in ED Medications - No data to display Medical decision making: This is a 52 year old male presenting with a chief complaint of aggressive behavior in the setting of cognitive dysfunction. Lives at a group home, aggressive behavior seen monthly over the past 2 years for similar episodes.  Now back to his baseline resting comfortably tolerating p.o. intake.  Group home and legal guardian or communicated with by nursing and they feel comfortable with his ongoing outpatient care management.  No acute indication for further intervention in the emergency room.  Disposition:  I have considered need for hospitalization, however, considering  all of the above, I believe this patient is stable for discharge at this time.  Patient/family educated about specific return precautions for given chief complaint and symptoms.  Patient/family educated about follow-up with PCP.     Patient/family expressed understanding of return precautions and need for follow-up. Patient spoken to regarding all imaging and laboratory results and appropriate follow up for these results. All education provided in verbal form with additional information in written form. Time was allowed for answering of patient questions. Patient discharged.    Emergency  Department Medication Summary:   Medications - No data to display      Clinical Impression:  1. Aggressive behavior      Discharge   Final Clinical Impression(s) / ED Diagnoses Final diagnoses:  Aggressive behavior    Rx / DC Orders ED Discharge Orders     None         Glyn Ade, MD 11/19/22 Rickey Primus

## 2022-11-19 NOTE — ED Notes (Signed)
Guardian here for pick up.
